# Patient Record
Sex: Female | Born: 1955
Health system: Southern US, Community
[De-identification: ages and names within clinical notes are randomized; demographics above are authoritative.]

## PROBLEM LIST (undated history)

## (undated) DIAGNOSIS — I77810 Thoracic aortic ectasia: Secondary | ICD-10-CM

## (undated) DIAGNOSIS — D509 Iron deficiency anemia, unspecified: Secondary | ICD-10-CM

## (undated) DIAGNOSIS — M1990 Unspecified osteoarthritis, unspecified site: Secondary | ICD-10-CM

## (undated) DIAGNOSIS — F419 Anxiety disorder, unspecified: Secondary | ICD-10-CM

## (undated) DIAGNOSIS — M4800 Spinal stenosis, site unspecified: Secondary | ICD-10-CM

## (undated) DIAGNOSIS — F32A Depression, unspecified: Secondary | ICD-10-CM

## (undated) DIAGNOSIS — Z9889 Other specified postprocedural states: Secondary | ICD-10-CM

## (undated) DIAGNOSIS — N3281 Overactive bladder: Secondary | ICD-10-CM

## (undated) DIAGNOSIS — H9192 Unspecified hearing loss, left ear: Secondary | ICD-10-CM

## (undated) DIAGNOSIS — K5790 Diverticulosis of intestine, part unspecified, without perforation or abscess without bleeding: Secondary | ICD-10-CM

## (undated) DIAGNOSIS — Z8669 Personal history of other diseases of the nervous system and sense organs: Secondary | ICD-10-CM

## (undated) DIAGNOSIS — F329 Major depressive disorder, single episode, unspecified: Secondary | ICD-10-CM

## (undated) DIAGNOSIS — I10 Essential (primary) hypertension: Secondary | ICD-10-CM

## (undated) DIAGNOSIS — I6529 Occlusion and stenosis of unspecified carotid artery: Secondary | ICD-10-CM

## (undated) DIAGNOSIS — E7800 Pure hypercholesterolemia, unspecified: Secondary | ICD-10-CM

## (undated) DIAGNOSIS — I7300 Raynaud's syndrome without gangrene: Secondary | ICD-10-CM

## (undated) DIAGNOSIS — G8321 Monoplegia of upper limb affecting right dominant side: Secondary | ICD-10-CM

## (undated) DIAGNOSIS — C50919 Malignant neoplasm of unspecified site of unspecified female breast: Secondary | ICD-10-CM

## (undated) DIAGNOSIS — K279 Peptic ulcer, site unspecified, unspecified as acute or chronic, without hemorrhage or perforation: Secondary | ICD-10-CM

## (undated) DIAGNOSIS — I359 Nonrheumatic aortic valve disorder, unspecified: Secondary | ICD-10-CM

## (undated) DIAGNOSIS — K219 Gastro-esophageal reflux disease without esophagitis: Secondary | ICD-10-CM

## (undated) HISTORY — PX: TUBAL LIGATION: SHX77

## (undated) HISTORY — DX: Other specified postprocedural states: Z98.890

## (undated) HISTORY — DX: Unspecified osteoarthritis, unspecified site: M19.90

## (undated) HISTORY — DX: Major depressive disorder, single episode, unspecified: F32.9

## (undated) HISTORY — DX: Iron deficiency anemia, unspecified: D50.9

## (undated) HISTORY — DX: Personal history of other diseases of the nervous system and sense organs: Z86.69

## (undated) HISTORY — DX: Diverticulosis of intestine, part unspecified, without perforation or abscess without bleeding: K57.90

## (undated) HISTORY — DX: Peptic ulcer, site unspecified, unspecified as acute or chronic, without hemorrhage or perforation: K27.9

## (undated) HISTORY — DX: Essential (primary) hypertension: I10

## (undated) HISTORY — DX: Occlusion and stenosis of unspecified carotid artery: I65.29

## (undated) HISTORY — DX: Gastro-esophageal reflux disease without esophagitis: K21.9

## (undated) HISTORY — DX: Pure hypercholesterolemia, unspecified: E78.00

## (undated) HISTORY — DX: Malignant neoplasm of unspecified site of unspecified female breast: C50.919

## (undated) HISTORY — DX: Thoracic aortic ectasia: I77.810

## (undated) HISTORY — DX: Raynaud's syndrome without gangrene: I73.00

## (undated) HISTORY — DX: Nonrheumatic aortic valve disorder, unspecified: I35.9

## (undated) HISTORY — PX: TONSILLECTOMY: SUR1361

## (undated) HISTORY — PX: BREAST SURGERY: SHX581

## (undated) HISTORY — DX: Spinal stenosis, site unspecified: M48.00

## (undated) HISTORY — DX: Depression, unspecified: F32.A

## (undated) HISTORY — DX: Anxiety disorder, unspecified: F41.9

## (undated) HISTORY — PX: APPENDECTOMY: SHX54

## (undated) HISTORY — PX: ESOPHAGOGASTRODUODENOSCOPY: SHX1529

## (undated) HISTORY — PX: TRIGGER FINGER RELEASE: SHX641

---

## 2009-12-18 HISTORY — PX: BREAST LUMPECTOMY WITH AXILLARY LYMPH NODE BIOPSY: SHX5593

## 2012-01-01 DIAGNOSIS — H524 Presbyopia: Secondary | ICD-10-CM | POA: Diagnosis not present

## 2012-01-01 DIAGNOSIS — H40019 Open angle with borderline findings, low risk, unspecified eye: Secondary | ICD-10-CM | POA: Diagnosis not present

## 2012-01-03 DIAGNOSIS — M545 Low back pain, unspecified: Secondary | ICD-10-CM | POA: Diagnosis not present

## 2012-01-08 DIAGNOSIS — Z853 Personal history of malignant neoplasm of breast: Secondary | ICD-10-CM | POA: Diagnosis not present

## 2012-01-11 DIAGNOSIS — Z853 Personal history of malignant neoplasm of breast: Secondary | ICD-10-CM | POA: Diagnosis not present

## 2012-01-22 DIAGNOSIS — C50919 Malignant neoplasm of unspecified site of unspecified female breast: Secondary | ICD-10-CM | POA: Diagnosis not present

## 2012-01-30 DIAGNOSIS — Z79899 Other long term (current) drug therapy: Secondary | ICD-10-CM | POA: Diagnosis not present

## 2012-01-30 DIAGNOSIS — M545 Low back pain: Secondary | ICD-10-CM | POA: Diagnosis not present

## 2012-01-30 DIAGNOSIS — I10 Essential (primary) hypertension: Secondary | ICD-10-CM | POA: Diagnosis not present

## 2012-01-30 DIAGNOSIS — G43009 Migraine without aura, not intractable, without status migrainosus: Secondary | ICD-10-CM | POA: Diagnosis not present

## 2012-01-30 DIAGNOSIS — E7800 Pure hypercholesterolemia, unspecified: Secondary | ICD-10-CM | POA: Diagnosis not present

## 2012-01-30 DIAGNOSIS — F341 Dysthymic disorder: Secondary | ICD-10-CM | POA: Diagnosis not present

## 2012-01-30 DIAGNOSIS — R634 Abnormal weight loss: Secondary | ICD-10-CM | POA: Diagnosis not present

## 2012-02-02 DIAGNOSIS — R634 Abnormal weight loss: Secondary | ICD-10-CM | POA: Diagnosis not present

## 2012-02-02 DIAGNOSIS — R918 Other nonspecific abnormal finding of lung field: Secondary | ICD-10-CM | POA: Diagnosis not present

## 2012-02-08 DIAGNOSIS — Z1212 Encounter for screening for malignant neoplasm of rectum: Secondary | ICD-10-CM | POA: Diagnosis not present

## 2012-02-27 DIAGNOSIS — G43009 Migraine without aura, not intractable, without status migrainosus: Secondary | ICD-10-CM | POA: Diagnosis not present

## 2012-02-27 DIAGNOSIS — R42 Dizziness and giddiness: Secondary | ICD-10-CM | POA: Diagnosis not present

## 2012-02-29 DIAGNOSIS — M545 Low back pain, unspecified: Secondary | ICD-10-CM | POA: Diagnosis not present

## 2012-02-29 DIAGNOSIS — M5137 Other intervertebral disc degeneration, lumbosacral region: Secondary | ICD-10-CM | POA: Diagnosis not present

## 2012-03-21 DIAGNOSIS — T148XXA Other injury of unspecified body region, initial encounter: Secondary | ICD-10-CM | POA: Diagnosis not present

## 2012-03-21 DIAGNOSIS — R221 Localized swelling, mass and lump, neck: Secondary | ICD-10-CM | POA: Diagnosis not present

## 2012-03-21 DIAGNOSIS — R42 Dizziness and giddiness: Secondary | ICD-10-CM | POA: Diagnosis not present

## 2012-03-21 DIAGNOSIS — R220 Localized swelling, mass and lump, head: Secondary | ICD-10-CM | POA: Diagnosis not present

## 2012-03-25 DIAGNOSIS — R220 Localized swelling, mass and lump, head: Secondary | ICD-10-CM | POA: Diagnosis not present

## 2012-03-25 DIAGNOSIS — M4120 Other idiopathic scoliosis, site unspecified: Secondary | ICD-10-CM | POA: Diagnosis not present

## 2012-03-25 DIAGNOSIS — R221 Localized swelling, mass and lump, neck: Secondary | ICD-10-CM | POA: Diagnosis not present

## 2012-04-09 DIAGNOSIS — H905 Unspecified sensorineural hearing loss: Secondary | ICD-10-CM | POA: Diagnosis not present

## 2012-04-12 DIAGNOSIS — H905 Unspecified sensorineural hearing loss: Secondary | ICD-10-CM | POA: Diagnosis not present

## 2012-04-12 DIAGNOSIS — R42 Dizziness and giddiness: Secondary | ICD-10-CM | POA: Diagnosis not present

## 2012-04-12 DIAGNOSIS — H903 Sensorineural hearing loss, bilateral: Secondary | ICD-10-CM | POA: Diagnosis not present

## 2012-04-19 DIAGNOSIS — H905 Unspecified sensorineural hearing loss: Secondary | ICD-10-CM | POA: Diagnosis not present

## 2012-04-25 DIAGNOSIS — H8110 Benign paroxysmal vertigo, unspecified ear: Secondary | ICD-10-CM | POA: Diagnosis not present

## 2012-04-29 DIAGNOSIS — R42 Dizziness and giddiness: Secondary | ICD-10-CM | POA: Diagnosis not present

## 2012-04-29 DIAGNOSIS — I10 Essential (primary) hypertension: Secondary | ICD-10-CM | POA: Diagnosis not present

## 2012-04-29 DIAGNOSIS — E7800 Pure hypercholesterolemia, unspecified: Secondary | ICD-10-CM | POA: Diagnosis not present

## 2012-04-29 DIAGNOSIS — F341 Dysthymic disorder: Secondary | ICD-10-CM | POA: Diagnosis not present

## 2012-04-30 DIAGNOSIS — R42 Dizziness and giddiness: Secondary | ICD-10-CM | POA: Diagnosis not present

## 2012-06-10 DIAGNOSIS — K219 Gastro-esophageal reflux disease without esophagitis: Secondary | ICD-10-CM | POA: Diagnosis not present

## 2012-06-10 DIAGNOSIS — K1370 Unspecified lesions of oral mucosa: Secondary | ICD-10-CM | POA: Diagnosis not present

## 2012-07-09 DIAGNOSIS — C50519 Malignant neoplasm of lower-outer quadrant of unspecified female breast: Secondary | ICD-10-CM | POA: Diagnosis not present

## 2012-08-30 DIAGNOSIS — M545 Low back pain, unspecified: Secondary | ICD-10-CM | POA: Diagnosis not present

## 2012-08-30 DIAGNOSIS — Z79899 Other long term (current) drug therapy: Secondary | ICD-10-CM | POA: Diagnosis not present

## 2012-08-30 DIAGNOSIS — G43009 Migraine without aura, not intractable, without status migrainosus: Secondary | ICD-10-CM | POA: Diagnosis not present

## 2012-08-30 DIAGNOSIS — I10 Essential (primary) hypertension: Secondary | ICD-10-CM | POA: Diagnosis not present

## 2012-08-30 DIAGNOSIS — E7800 Pure hypercholesterolemia, unspecified: Secondary | ICD-10-CM | POA: Diagnosis not present

## 2012-09-17 DIAGNOSIS — S46819A Strain of other muscles, fascia and tendons at shoulder and upper arm level, unspecified arm, initial encounter: Secondary | ICD-10-CM | POA: Diagnosis not present

## 2012-09-17 DIAGNOSIS — S43499A Other sprain of unspecified shoulder joint, initial encounter: Secondary | ICD-10-CM | POA: Diagnosis not present

## 2012-10-24 DIAGNOSIS — Z23 Encounter for immunization: Secondary | ICD-10-CM | POA: Diagnosis not present

## 2012-11-19 DIAGNOSIS — K219 Gastro-esophageal reflux disease without esophagitis: Secondary | ICD-10-CM | POA: Diagnosis not present

## 2012-11-19 DIAGNOSIS — R1013 Epigastric pain: Secondary | ICD-10-CM | POA: Diagnosis not present

## 2012-11-19 DIAGNOSIS — K5900 Constipation, unspecified: Secondary | ICD-10-CM | POA: Diagnosis not present

## 2012-11-19 DIAGNOSIS — R490 Dysphonia: Secondary | ICD-10-CM | POA: Diagnosis not present

## 2012-11-26 DIAGNOSIS — Z1212 Encounter for screening for malignant neoplasm of rectum: Secondary | ICD-10-CM | POA: Diagnosis not present

## 2012-12-18 HISTORY — PX: COLONOSCOPY: SHX174

## 2012-12-30 DIAGNOSIS — E7800 Pure hypercholesterolemia, unspecified: Secondary | ICD-10-CM | POA: Diagnosis not present

## 2012-12-30 DIAGNOSIS — D649 Anemia, unspecified: Secondary | ICD-10-CM | POA: Diagnosis not present

## 2012-12-30 DIAGNOSIS — I7300 Raynaud's syndrome without gangrene: Secondary | ICD-10-CM | POA: Diagnosis not present

## 2012-12-30 DIAGNOSIS — I10 Essential (primary) hypertension: Secondary | ICD-10-CM | POA: Diagnosis not present

## 2012-12-30 DIAGNOSIS — K219 Gastro-esophageal reflux disease without esophagitis: Secondary | ICD-10-CM | POA: Diagnosis not present

## 2013-01-06 DIAGNOSIS — R634 Abnormal weight loss: Secondary | ICD-10-CM | POA: Diagnosis not present

## 2013-01-06 DIAGNOSIS — D500 Iron deficiency anemia secondary to blood loss (chronic): Secondary | ICD-10-CM | POA: Diagnosis not present

## 2013-01-06 DIAGNOSIS — R195 Other fecal abnormalities: Secondary | ICD-10-CM | POA: Diagnosis not present

## 2013-01-06 DIAGNOSIS — R1013 Epigastric pain: Secondary | ICD-10-CM | POA: Diagnosis not present

## 2013-01-08 DIAGNOSIS — C50919 Malignant neoplasm of unspecified site of unspecified female breast: Secondary | ICD-10-CM | POA: Diagnosis not present

## 2013-01-14 DIAGNOSIS — K921 Melena: Secondary | ICD-10-CM | POA: Diagnosis not present

## 2013-01-14 DIAGNOSIS — R928 Other abnormal and inconclusive findings on diagnostic imaging of breast: Secondary | ICD-10-CM | POA: Diagnosis not present

## 2013-01-23 DIAGNOSIS — K263 Acute duodenal ulcer without hemorrhage or perforation: Secondary | ICD-10-CM | POA: Diagnosis not present

## 2013-01-23 DIAGNOSIS — D649 Anemia, unspecified: Secondary | ICD-10-CM | POA: Diagnosis not present

## 2013-01-23 DIAGNOSIS — K259 Gastric ulcer, unspecified as acute or chronic, without hemorrhage or perforation: Secondary | ICD-10-CM | POA: Diagnosis not present

## 2013-01-23 DIAGNOSIS — F17200 Nicotine dependence, unspecified, uncomplicated: Secondary | ICD-10-CM | POA: Diagnosis not present

## 2013-01-23 DIAGNOSIS — K921 Melena: Secondary | ICD-10-CM | POA: Diagnosis not present

## 2013-01-23 DIAGNOSIS — K5730 Diverticulosis of large intestine without perforation or abscess without bleeding: Secondary | ICD-10-CM | POA: Diagnosis not present

## 2013-01-27 DIAGNOSIS — I10 Essential (primary) hypertension: Secondary | ICD-10-CM | POA: Diagnosis not present

## 2013-01-27 DIAGNOSIS — C50919 Malignant neoplasm of unspecified site of unspecified female breast: Secondary | ICD-10-CM | POA: Diagnosis not present

## 2013-01-27 DIAGNOSIS — I359 Nonrheumatic aortic valve disorder, unspecified: Secondary | ICD-10-CM | POA: Diagnosis not present

## 2013-01-27 DIAGNOSIS — Z0389 Encounter for observation for other suspected diseases and conditions ruled out: Secondary | ICD-10-CM | POA: Diagnosis not present

## 2013-01-27 DIAGNOSIS — I369 Nonrheumatic tricuspid valve disorder, unspecified: Secondary | ICD-10-CM | POA: Diagnosis not present

## 2013-01-27 DIAGNOSIS — Z79899 Other long term (current) drug therapy: Secondary | ICD-10-CM | POA: Diagnosis not present

## 2013-02-18 DIAGNOSIS — R928 Other abnormal and inconclusive findings on diagnostic imaging of breast: Secondary | ICD-10-CM | POA: Diagnosis not present

## 2013-02-21 DIAGNOSIS — Z79899 Other long term (current) drug therapy: Secondary | ICD-10-CM | POA: Diagnosis not present

## 2013-02-21 DIAGNOSIS — C50919 Malignant neoplasm of unspecified site of unspecified female breast: Secondary | ICD-10-CM | POA: Diagnosis not present

## 2013-02-21 DIAGNOSIS — I10 Essential (primary) hypertension: Secondary | ICD-10-CM | POA: Diagnosis not present

## 2013-02-26 DIAGNOSIS — R928 Other abnormal and inconclusive findings on diagnostic imaging of breast: Secondary | ICD-10-CM | POA: Diagnosis not present

## 2013-02-26 DIAGNOSIS — R920 Mammographic microcalcification found on diagnostic imaging of breast: Secondary | ICD-10-CM | POA: Diagnosis not present

## 2013-02-26 DIAGNOSIS — Z79899 Other long term (current) drug therapy: Secondary | ICD-10-CM | POA: Diagnosis not present

## 2013-02-26 DIAGNOSIS — N6039 Fibrosclerosis of unspecified breast: Secondary | ICD-10-CM | POA: Diagnosis not present

## 2013-02-26 DIAGNOSIS — N63 Unspecified lump in unspecified breast: Secondary | ICD-10-CM | POA: Diagnosis not present

## 2013-02-26 DIAGNOSIS — I10 Essential (primary) hypertension: Secondary | ICD-10-CM | POA: Diagnosis not present

## 2013-02-26 DIAGNOSIS — C50919 Malignant neoplasm of unspecified site of unspecified female breast: Secondary | ICD-10-CM | POA: Diagnosis not present

## 2013-02-26 DIAGNOSIS — F17200 Nicotine dependence, unspecified, uncomplicated: Secondary | ICD-10-CM | POA: Diagnosis not present

## 2013-02-26 DIAGNOSIS — J449 Chronic obstructive pulmonary disease, unspecified: Secondary | ICD-10-CM | POA: Diagnosis not present

## 2013-03-06 DIAGNOSIS — M654 Radial styloid tenosynovitis [de Quervain]: Secondary | ICD-10-CM | POA: Diagnosis not present

## 2013-03-18 DIAGNOSIS — M25529 Pain in unspecified elbow: Secondary | ICD-10-CM | POA: Diagnosis not present

## 2013-03-18 DIAGNOSIS — M654 Radial styloid tenosynovitis [de Quervain]: Secondary | ICD-10-CM | POA: Diagnosis not present

## 2013-03-31 DIAGNOSIS — M5137 Other intervertebral disc degeneration, lumbosacral region: Secondary | ICD-10-CM | POA: Diagnosis not present

## 2013-03-31 DIAGNOSIS — M545 Low back pain, unspecified: Secondary | ICD-10-CM | POA: Diagnosis not present

## 2013-03-31 DIAGNOSIS — M48061 Spinal stenosis, lumbar region without neurogenic claudication: Secondary | ICD-10-CM | POA: Diagnosis not present

## 2013-04-29 DIAGNOSIS — Z79899 Other long term (current) drug therapy: Secondary | ICD-10-CM | POA: Diagnosis not present

## 2013-04-29 DIAGNOSIS — D649 Anemia, unspecified: Secondary | ICD-10-CM | POA: Diagnosis not present

## 2013-04-29 DIAGNOSIS — I10 Essential (primary) hypertension: Secondary | ICD-10-CM | POA: Diagnosis not present

## 2013-04-29 DIAGNOSIS — G43009 Migraine without aura, not intractable, without status migrainosus: Secondary | ICD-10-CM | POA: Diagnosis not present

## 2013-05-01 DIAGNOSIS — M5137 Other intervertebral disc degeneration, lumbosacral region: Secondary | ICD-10-CM | POA: Diagnosis not present

## 2013-05-01 DIAGNOSIS — Z006 Encounter for examination for normal comparison and control in clinical research program: Secondary | ICD-10-CM | POA: Diagnosis not present

## 2013-05-01 DIAGNOSIS — M48061 Spinal stenosis, lumbar region without neurogenic claudication: Secondary | ICD-10-CM | POA: Diagnosis not present

## 2013-05-01 DIAGNOSIS — M545 Low back pain, unspecified: Secondary | ICD-10-CM | POA: Diagnosis not present

## 2013-05-12 DIAGNOSIS — N951 Menopausal and female climacteric states: Secondary | ICD-10-CM | POA: Diagnosis not present

## 2013-05-15 DIAGNOSIS — M5137 Other intervertebral disc degeneration, lumbosacral region: Secondary | ICD-10-CM | POA: Diagnosis not present

## 2013-05-15 DIAGNOSIS — M48061 Spinal stenosis, lumbar region without neurogenic claudication: Secondary | ICD-10-CM | POA: Diagnosis not present

## 2013-06-12 DIAGNOSIS — M545 Low back pain, unspecified: Secondary | ICD-10-CM | POA: Diagnosis not present

## 2013-06-12 DIAGNOSIS — M5137 Other intervertebral disc degeneration, lumbosacral region: Secondary | ICD-10-CM | POA: Diagnosis not present

## 2013-06-12 DIAGNOSIS — M48061 Spinal stenosis, lumbar region without neurogenic claudication: Secondary | ICD-10-CM | POA: Diagnosis not present

## 2013-06-19 DIAGNOSIS — M545 Low back pain: Secondary | ICD-10-CM | POA: Diagnosis not present

## 2013-06-30 DIAGNOSIS — M48061 Spinal stenosis, lumbar region without neurogenic claudication: Secondary | ICD-10-CM | POA: Diagnosis not present

## 2013-07-02 DIAGNOSIS — M545 Low back pain, unspecified: Secondary | ICD-10-CM | POA: Diagnosis not present

## 2013-07-02 DIAGNOSIS — IMO0002 Reserved for concepts with insufficient information to code with codable children: Secondary | ICD-10-CM | POA: Diagnosis not present

## 2013-07-02 DIAGNOSIS — M48061 Spinal stenosis, lumbar region without neurogenic claudication: Secondary | ICD-10-CM | POA: Diagnosis not present

## 2013-07-09 ENCOUNTER — Other Ambulatory Visit: Payer: Self-pay | Admitting: Anesthesiology

## 2013-07-09 DIAGNOSIS — IMO0002 Reserved for concepts with insufficient information to code with codable children: Secondary | ICD-10-CM

## 2013-07-16 ENCOUNTER — Ambulatory Visit
Admission: RE | Admit: 2013-07-16 | Discharge: 2013-07-16 | Disposition: A | Discharge status: Home / Self Care | Payer: Medicare Other | Source: Ambulatory Visit | Attending: Anesthesiology | Admitting: Anesthesiology

## 2013-07-16 DIAGNOSIS — M48061 Spinal stenosis, lumbar region without neurogenic claudication: Secondary | ICD-10-CM | POA: Diagnosis not present

## 2013-07-16 DIAGNOSIS — M47817 Spondylosis without myelopathy or radiculopathy, lumbosacral region: Secondary | ICD-10-CM | POA: Diagnosis not present

## 2013-07-16 DIAGNOSIS — IMO0002 Reserved for concepts with insufficient information to code with codable children: Secondary | ICD-10-CM

## 2013-08-11 DIAGNOSIS — M545 Low back pain, unspecified: Secondary | ICD-10-CM | POA: Diagnosis not present

## 2013-08-11 DIAGNOSIS — IMO0002 Reserved for concepts with insufficient information to code with codable children: Secondary | ICD-10-CM | POA: Diagnosis not present

## 2013-08-11 DIAGNOSIS — M5137 Other intervertebral disc degeneration, lumbosacral region: Secondary | ICD-10-CM | POA: Diagnosis not present

## 2013-08-11 DIAGNOSIS — M48061 Spinal stenosis, lumbar region without neurogenic claudication: Secondary | ICD-10-CM | POA: Diagnosis not present

## 2013-08-12 DIAGNOSIS — M545 Low back pain, unspecified: Secondary | ICD-10-CM | POA: Diagnosis not present

## 2013-08-12 DIAGNOSIS — M51379 Other intervertebral disc degeneration, lumbosacral region without mention of lumbar back pain or lower extremity pain: Secondary | ICD-10-CM | POA: Diagnosis not present

## 2013-08-12 DIAGNOSIS — M5137 Other intervertebral disc degeneration, lumbosacral region: Secondary | ICD-10-CM | POA: Diagnosis not present

## 2013-08-28 DIAGNOSIS — M545 Low back pain: Secondary | ICD-10-CM | POA: Diagnosis not present

## 2013-09-01 DIAGNOSIS — M545 Low back pain: Secondary | ICD-10-CM | POA: Diagnosis not present

## 2013-09-02 DIAGNOSIS — Z79899 Other long term (current) drug therapy: Secondary | ICD-10-CM | POA: Diagnosis not present

## 2013-09-02 DIAGNOSIS — G43009 Migraine without aura, not intractable, without status migrainosus: Secondary | ICD-10-CM | POA: Diagnosis not present

## 2013-09-02 DIAGNOSIS — E7800 Pure hypercholesterolemia, unspecified: Secondary | ICD-10-CM | POA: Diagnosis not present

## 2013-09-02 DIAGNOSIS — I10 Essential (primary) hypertension: Secondary | ICD-10-CM | POA: Diagnosis not present

## 2013-09-02 DIAGNOSIS — F341 Dysthymic disorder: Secondary | ICD-10-CM | POA: Diagnosis not present

## 2013-09-02 DIAGNOSIS — M545 Low back pain: Secondary | ICD-10-CM | POA: Diagnosis not present

## 2013-09-05 DIAGNOSIS — M545 Low back pain: Secondary | ICD-10-CM | POA: Diagnosis not present

## 2013-10-06 DIAGNOSIS — M545 Low back pain: Secondary | ICD-10-CM | POA: Diagnosis not present

## 2013-10-06 DIAGNOSIS — D509 Iron deficiency anemia, unspecified: Secondary | ICD-10-CM | POA: Diagnosis not present

## 2013-10-12 DIAGNOSIS — Z23 Encounter for immunization: Secondary | ICD-10-CM | POA: Diagnosis not present

## 2013-10-27 DIAGNOSIS — M545 Low back pain: Secondary | ICD-10-CM | POA: Diagnosis not present

## 2013-11-03 DIAGNOSIS — M545 Low back pain: Secondary | ICD-10-CM | POA: Diagnosis not present

## 2013-11-04 DIAGNOSIS — M545 Low back pain, unspecified: Secondary | ICD-10-CM | POA: Diagnosis not present

## 2013-11-04 DIAGNOSIS — M48061 Spinal stenosis, lumbar region without neurogenic claudication: Secondary | ICD-10-CM | POA: Diagnosis not present

## 2013-11-04 DIAGNOSIS — M5137 Other intervertebral disc degeneration, lumbosacral region: Secondary | ICD-10-CM | POA: Diagnosis not present

## 2013-11-04 DIAGNOSIS — M62838 Other muscle spasm: Secondary | ICD-10-CM | POA: Diagnosis not present

## 2013-11-10 DIAGNOSIS — M545 Low back pain: Secondary | ICD-10-CM | POA: Diagnosis not present

## 2013-11-17 DIAGNOSIS — M545 Low back pain: Secondary | ICD-10-CM | POA: Diagnosis not present

## 2014-01-02 DIAGNOSIS — F17200 Nicotine dependence, unspecified, uncomplicated: Secondary | ICD-10-CM | POA: Diagnosis not present

## 2014-01-02 DIAGNOSIS — I10 Essential (primary) hypertension: Secondary | ICD-10-CM | POA: Diagnosis not present

## 2014-01-02 DIAGNOSIS — M545 Low back pain, unspecified: Secondary | ICD-10-CM | POA: Diagnosis not present

## 2014-01-02 DIAGNOSIS — E7800 Pure hypercholesterolemia, unspecified: Secondary | ICD-10-CM | POA: Diagnosis not present

## 2014-01-02 DIAGNOSIS — Z79899 Other long term (current) drug therapy: Secondary | ICD-10-CM | POA: Diagnosis not present

## 2014-01-02 DIAGNOSIS — K219 Gastro-esophageal reflux disease without esophagitis: Secondary | ICD-10-CM | POA: Diagnosis not present

## 2014-01-02 DIAGNOSIS — F341 Dysthymic disorder: Secondary | ICD-10-CM | POA: Diagnosis not present

## 2014-01-02 DIAGNOSIS — G43009 Migraine without aura, not intractable, without status migrainosus: Secondary | ICD-10-CM | POA: Diagnosis not present

## 2014-01-14 DIAGNOSIS — C50919 Malignant neoplasm of unspecified site of unspecified female breast: Secondary | ICD-10-CM | POA: Diagnosis not present

## 2014-01-14 DIAGNOSIS — R922 Inconclusive mammogram: Secondary | ICD-10-CM | POA: Diagnosis not present

## 2014-01-27 DIAGNOSIS — C779 Secondary and unspecified malignant neoplasm of lymph node, unspecified: Secondary | ICD-10-CM | POA: Diagnosis not present

## 2014-01-27 DIAGNOSIS — C50119 Malignant neoplasm of central portion of unspecified female breast: Secondary | ICD-10-CM | POA: Diagnosis not present

## 2014-01-29 DIAGNOSIS — M545 Low back pain, unspecified: Secondary | ICD-10-CM | POA: Diagnosis not present

## 2014-01-29 DIAGNOSIS — M48061 Spinal stenosis, lumbar region without neurogenic claudication: Secondary | ICD-10-CM | POA: Diagnosis not present

## 2014-01-29 DIAGNOSIS — M4120 Other idiopathic scoliosis, site unspecified: Secondary | ICD-10-CM | POA: Insufficient documentation

## 2014-01-29 DIAGNOSIS — M62838 Other muscle spasm: Secondary | ICD-10-CM | POA: Diagnosis not present

## 2014-01-29 DIAGNOSIS — IMO0002 Reserved for concepts with insufficient information to code with codable children: Secondary | ICD-10-CM | POA: Diagnosis not present

## 2014-04-23 DIAGNOSIS — M5137 Other intervertebral disc degeneration, lumbosacral region: Secondary | ICD-10-CM | POA: Diagnosis not present

## 2014-04-23 DIAGNOSIS — IMO0002 Reserved for concepts with insufficient information to code with codable children: Secondary | ICD-10-CM | POA: Diagnosis not present

## 2014-04-23 DIAGNOSIS — M62838 Other muscle spasm: Secondary | ICD-10-CM | POA: Diagnosis not present

## 2014-04-23 DIAGNOSIS — M5417 Radiculopathy, lumbosacral region: Secondary | ICD-10-CM | POA: Insufficient documentation

## 2014-04-23 DIAGNOSIS — M545 Low back pain, unspecified: Secondary | ICD-10-CM | POA: Diagnosis not present

## 2014-05-27 DIAGNOSIS — L821 Other seborrheic keratosis: Secondary | ICD-10-CM | POA: Diagnosis not present

## 2014-05-27 DIAGNOSIS — C44319 Basal cell carcinoma of skin of other parts of face: Secondary | ICD-10-CM | POA: Diagnosis not present

## 2014-07-02 DIAGNOSIS — R5383 Other fatigue: Secondary | ICD-10-CM | POA: Diagnosis not present

## 2014-07-02 DIAGNOSIS — G43009 Migraine without aura, not intractable, without status migrainosus: Secondary | ICD-10-CM | POA: Diagnosis not present

## 2014-07-02 DIAGNOSIS — I359 Nonrheumatic aortic valve disorder, unspecified: Secondary | ICD-10-CM | POA: Diagnosis not present

## 2014-07-02 DIAGNOSIS — K219 Gastro-esophageal reflux disease without esophagitis: Secondary | ICD-10-CM | POA: Diagnosis not present

## 2014-07-02 DIAGNOSIS — E7800 Pure hypercholesterolemia, unspecified: Secondary | ICD-10-CM | POA: Diagnosis not present

## 2014-07-02 DIAGNOSIS — F341 Dysthymic disorder: Secondary | ICD-10-CM | POA: Diagnosis not present

## 2014-07-02 DIAGNOSIS — N318 Other neuromuscular dysfunction of bladder: Secondary | ICD-10-CM | POA: Diagnosis not present

## 2014-07-02 DIAGNOSIS — R5381 Other malaise: Secondary | ICD-10-CM | POA: Diagnosis not present

## 2014-07-16 DIAGNOSIS — M545 Low back pain, unspecified: Secondary | ICD-10-CM | POA: Diagnosis not present

## 2014-07-16 DIAGNOSIS — M62838 Other muscle spasm: Secondary | ICD-10-CM | POA: Diagnosis not present

## 2014-07-16 DIAGNOSIS — M4120 Other idiopathic scoliosis, site unspecified: Secondary | ICD-10-CM | POA: Diagnosis not present

## 2014-07-16 DIAGNOSIS — IMO0002 Reserved for concepts with insufficient information to code with codable children: Secondary | ICD-10-CM | POA: Diagnosis not present

## 2014-09-09 DIAGNOSIS — H40019 Open angle with borderline findings, low risk, unspecified eye: Secondary | ICD-10-CM | POA: Diagnosis not present

## 2014-09-16 DIAGNOSIS — M545 Low back pain, unspecified: Secondary | ICD-10-CM | POA: Diagnosis not present

## 2014-09-16 DIAGNOSIS — M5137 Other intervertebral disc degeneration, lumbosacral region: Secondary | ICD-10-CM | POA: Diagnosis not present

## 2014-09-16 DIAGNOSIS — IMO0002 Reserved for concepts with insufficient information to code with codable children: Secondary | ICD-10-CM | POA: Diagnosis not present

## 2014-09-16 DIAGNOSIS — M62838 Other muscle spasm: Secondary | ICD-10-CM | POA: Diagnosis not present

## 2014-10-14 DIAGNOSIS — H40013 Open angle with borderline findings, low risk, bilateral: Secondary | ICD-10-CM | POA: Diagnosis not present

## 2014-10-21 DIAGNOSIS — Z23 Encounter for immunization: Secondary | ICD-10-CM | POA: Diagnosis not present

## 2014-12-07 DIAGNOSIS — M62838 Other muscle spasm: Secondary | ICD-10-CM | POA: Diagnosis not present

## 2014-12-07 DIAGNOSIS — M4806 Spinal stenosis, lumbar region: Secondary | ICD-10-CM | POA: Diagnosis not present

## 2014-12-07 DIAGNOSIS — M5136 Other intervertebral disc degeneration, lumbar region: Secondary | ICD-10-CM | POA: Diagnosis not present

## 2014-12-07 DIAGNOSIS — M5416 Radiculopathy, lumbar region: Secondary | ICD-10-CM | POA: Diagnosis not present

## 2014-12-07 DIAGNOSIS — M545 Low back pain: Secondary | ICD-10-CM | POA: Diagnosis not present

## 2014-12-07 DIAGNOSIS — M4120 Other idiopathic scoliosis, site unspecified: Secondary | ICD-10-CM | POA: Diagnosis not present

## 2015-01-12 DIAGNOSIS — F418 Other specified anxiety disorders: Secondary | ICD-10-CM | POA: Diagnosis not present

## 2015-01-12 DIAGNOSIS — Z79899 Other long term (current) drug therapy: Secondary | ICD-10-CM | POA: Diagnosis not present

## 2015-01-12 DIAGNOSIS — K219 Gastro-esophageal reflux disease without esophagitis: Secondary | ICD-10-CM | POA: Diagnosis not present

## 2015-01-12 DIAGNOSIS — I359 Nonrheumatic aortic valve disorder, unspecified: Secondary | ICD-10-CM | POA: Diagnosis not present

## 2015-01-12 DIAGNOSIS — Z1389 Encounter for screening for other disorder: Secondary | ICD-10-CM | POA: Diagnosis not present

## 2015-01-12 DIAGNOSIS — G43009 Migraine without aura, not intractable, without status migrainosus: Secondary | ICD-10-CM | POA: Diagnosis not present

## 2015-01-12 DIAGNOSIS — N3281 Overactive bladder: Secondary | ICD-10-CM | POA: Diagnosis not present

## 2015-01-12 DIAGNOSIS — Z124 Encounter for screening for malignant neoplasm of cervix: Secondary | ICD-10-CM | POA: Diagnosis not present

## 2015-01-12 DIAGNOSIS — E780 Pure hypercholesterolemia: Secondary | ICD-10-CM | POA: Diagnosis not present

## 2015-01-12 DIAGNOSIS — M545 Low back pain: Secondary | ICD-10-CM | POA: Diagnosis not present

## 2015-01-18 DIAGNOSIS — Z9889 Other specified postprocedural states: Secondary | ICD-10-CM | POA: Diagnosis not present

## 2015-01-18 DIAGNOSIS — C50919 Malignant neoplasm of unspecified site of unspecified female breast: Secondary | ICD-10-CM | POA: Diagnosis not present

## 2015-01-18 DIAGNOSIS — Z853 Personal history of malignant neoplasm of breast: Secondary | ICD-10-CM | POA: Diagnosis not present

## 2015-02-09 DIAGNOSIS — C50111 Malignant neoplasm of central portion of right female breast: Secondary | ICD-10-CM | POA: Diagnosis not present

## 2015-02-09 DIAGNOSIS — C773 Secondary and unspecified malignant neoplasm of axilla and upper limb lymph nodes: Secondary | ICD-10-CM | POA: Diagnosis not present

## 2015-02-09 DIAGNOSIS — Z170 Estrogen receptor positive status [ER+]: Secondary | ICD-10-CM | POA: Diagnosis not present

## 2015-03-02 DIAGNOSIS — M62838 Other muscle spasm: Secondary | ICD-10-CM | POA: Diagnosis not present

## 2015-03-02 DIAGNOSIS — M4120 Other idiopathic scoliosis, site unspecified: Secondary | ICD-10-CM | POA: Diagnosis not present

## 2015-03-02 DIAGNOSIS — M4806 Spinal stenosis, lumbar region: Secondary | ICD-10-CM | POA: Diagnosis not present

## 2015-03-02 DIAGNOSIS — M5136 Other intervertebral disc degeneration, lumbar region: Secondary | ICD-10-CM | POA: Diagnosis not present

## 2015-03-02 DIAGNOSIS — M5416 Radiculopathy, lumbar region: Secondary | ICD-10-CM | POA: Diagnosis not present

## 2015-03-02 DIAGNOSIS — M545 Low back pain: Secondary | ICD-10-CM | POA: Diagnosis not present

## 2015-03-16 DIAGNOSIS — Z01419 Encounter for gynecological examination (general) (routine) without abnormal findings: Secondary | ICD-10-CM | POA: Diagnosis not present

## 2015-03-17 DIAGNOSIS — Z01419 Encounter for gynecological examination (general) (routine) without abnormal findings: Secondary | ICD-10-CM | POA: Diagnosis not present

## 2015-05-20 DIAGNOSIS — M4120 Other idiopathic scoliosis, site unspecified: Secondary | ICD-10-CM | POA: Diagnosis not present

## 2015-05-20 DIAGNOSIS — M5136 Other intervertebral disc degeneration, lumbar region: Secondary | ICD-10-CM | POA: Diagnosis not present

## 2015-05-20 DIAGNOSIS — M4806 Spinal stenosis, lumbar region: Secondary | ICD-10-CM | POA: Diagnosis not present

## 2015-05-20 DIAGNOSIS — M62838 Other muscle spasm: Secondary | ICD-10-CM | POA: Diagnosis not present

## 2015-05-20 DIAGNOSIS — M5416 Radiculopathy, lumbar region: Secondary | ICD-10-CM | POA: Diagnosis not present

## 2015-05-20 DIAGNOSIS — M545 Low back pain: Secondary | ICD-10-CM | POA: Diagnosis not present

## 2015-07-20 DIAGNOSIS — I359 Nonrheumatic aortic valve disorder, unspecified: Secondary | ICD-10-CM | POA: Diagnosis not present

## 2015-07-20 DIAGNOSIS — R609 Edema, unspecified: Secondary | ICD-10-CM | POA: Diagnosis not present

## 2015-07-20 DIAGNOSIS — I10 Essential (primary) hypertension: Secondary | ICD-10-CM | POA: Diagnosis not present

## 2015-07-20 DIAGNOSIS — Z681 Body mass index (BMI) 19 or less, adult: Secondary | ICD-10-CM | POA: Diagnosis not present

## 2015-07-20 DIAGNOSIS — M545 Low back pain: Secondary | ICD-10-CM | POA: Diagnosis not present

## 2015-07-20 DIAGNOSIS — F418 Other specified anxiety disorders: Secondary | ICD-10-CM | POA: Diagnosis not present

## 2015-07-20 DIAGNOSIS — K219 Gastro-esophageal reflux disease without esophagitis: Secondary | ICD-10-CM | POA: Diagnosis not present

## 2015-07-20 DIAGNOSIS — E780 Pure hypercholesterolemia: Secondary | ICD-10-CM | POA: Diagnosis not present

## 2015-07-28 DIAGNOSIS — I517 Cardiomegaly: Secondary | ICD-10-CM | POA: Diagnosis not present

## 2015-07-28 DIAGNOSIS — I351 Nonrheumatic aortic (valve) insufficiency: Secondary | ICD-10-CM | POA: Diagnosis not present

## 2015-07-28 DIAGNOSIS — I359 Nonrheumatic aortic valve disorder, unspecified: Secondary | ICD-10-CM | POA: Diagnosis not present

## 2015-08-10 ENCOUNTER — Institutional Professional Consult (permissible substitution) (INDEPENDENT_AMBULATORY_CARE_PROVIDER_SITE_OTHER): Payer: Medicare Other | Admitting: Cardiothoracic Surgery

## 2015-08-10 ENCOUNTER — Encounter: Payer: Medicare Other | Admitting: Cardiothoracic Surgery

## 2015-08-10 VITALS — BP 120/73 | HR 72 | Resp 20 | Ht 61.5 in | Wt 98.0 lb

## 2015-08-10 DIAGNOSIS — Z952 Presence of prosthetic heart valve: Secondary | ICD-10-CM | POA: Insufficient documentation

## 2015-08-10 DIAGNOSIS — I350 Nonrheumatic aortic (valve) stenosis: Secondary | ICD-10-CM | POA: Diagnosis not present

## 2015-08-10 NOTE — Progress Notes (Signed)
PCP is Fae Pippin Referring Provider is Dorathy Kinsman, MD  Chief Complaint  Patient presents with  . Aortic Stenosis    Surgical eval, ECHO 07/28/15 @ [facility]  severe aortic stenosis on recent 2-D echocardiogram with a peak transvalvular gradient 100 mm mercury, mean transvalvular gradient 55 mmHg. Patient examined and the 2-D echocardiogram personally reviewed  HPI: 59 year old Caucasian female smoker with history of cardiac murmur since age 5. Subsequent echocardiogram demonstrated that she had a bicuspid aortic valve. She had 2 uncomplicated pregnancies. She is been followed carefully by her primary care physician with serial echocardiograms. In 2014 8 transthoracic echocardiogram demonstrated moderate aortic stenosis with a peak gradient of 50 mm mercury. She was asymptomatic. Over the past 3-4 years the patient has had progressive weight loss and has had a duodenal ulcer diagnosed and treated since 2014. The patient remains fairly active in her [place] but is unable to regain weight. A repeat echocardiogram performed earlier this month demonstrates severe aortic stenosis with the gradients as noted above. The images show a heavily calcified valve very thickened. The LV outflow tract is approximately 18-20 mm. There is no significant mitral regurgitation or tricuspid regurgitation. LV systolic function is normal. There is mild LVH. The aortic root does not appear to be dilated.the patient is in a sinus rhythm. She specifically denies symptoms of chest pain or shortness of breath. She does have some palpitations and some symptoms of presyncope.She has noted significant pedal edema over the past 2 months.  The patient denies any active dental problems. She has partial plates. She had her teeth cleaned within the past year.  The patient is a heavy smoker. She still smoking just over one pack per day. She denies history current bronchitis or pneumonia. PFTs are pending.  Past Medical History   Diagnosis Date  . Hypertension   . Aortic valve disease   . Migraine   . Hypercholesterolemia   . Anxiety and depression   . Low back pain   . Iron deficiency anemia   . Osteoarthritis     hands  . Breast cancer     Dr Bobby Rumpf Dr Rogelia Boga, right side 2011  . Raynaud's syndrome   . GERD (gastroesophageal reflux disease)     Past Surgical History  Procedure Laterality Date  . Breast surgery      right side  . Cesarean section    . Tubal ligation      Family History  Problem Relation Age of Onset  . COPD Father   . Heart disease Father   . Hyperlipidemia Father   . Hypertension Father   . Hypertension Mother   . Heart disease Sister   . Hyperlipidemia Sister   . Hypertension Sister   . COPD Brother   . Heart disease Brother   . Hyperlipidemia Brother   . Hypertension Brother     Social History Social History  Substance Use Topics  . Smoking status: Current Every Day Smoker -- 1.00 packs/day    Types: Cigarettes  . Smokeless tobacco: None  . Alcohol Use: No    Current Outpatient Prescriptions  Medication Sig Dispense Refill  . ALPRAZolam (XANAX) 1 MG tablet Take 1 mg by mouth at bedtime as needed for anxiety.    . citalopram (CELEXA) 20 MG tablet Take 20 mg by mouth daily.    Marland Kitchen esomeprazole (NEXIUM) 40 MG capsule Take 40 mg by mouth daily at 12 noon.    . ferrous sulfate 325 (65 FE) MG tablet Take  325 mg by mouth daily with breakfast.    . hydrOXYzine (ATARAX/VISTARIL) 25 MG tablet Take 25 mg by mouth 2 (two) times daily.    . magnesium hydroxide (MILK OF MAGNESIA) 400 MG/5ML suspension Take by mouth daily as needed for mild constipation.    . OxyCODONE (OXYCONTIN) 20 mg T12A 12 hr tablet Take 20 mg by mouth every 12 (twelve) hours.    Marland Kitchen oxyCODONE-acetaminophen (PERCOCET) 7.5-325 MG per tablet Take 1 tablet by mouth every 8 (eight) hours as needed for severe pain.    . promethazine (PHENERGAN) 25 MG tablet Take 25 mg by mouth every 6 (six) hours as needed for  nausea or vomiting.    . ranitidine (ZANTAC) 150 MG tablet Take 150 mg by mouth 2 (two) times daily.    . simvastatin (ZOCOR) 40 MG tablet Take 40 mg by mouth daily.    . solifenacin (VESICARE) 5 MG tablet Take 5 mg by mouth daily.    . SUMAtriptan (IMITREX) 100 MG tablet Take 100 mg by mouth every 2 (two) hours as needed for migraine. May repeat in 2 hours if headache persists or recurs.    . cyclobenzaprine (FLEXERIL) 10 MG tablet Take 10 mg by mouth 3 (three) times daily as needed for muscle spasms.     No current facility-administered medications for this visit.    No Known Allergies  Review of Systems         Review of Systems :  [ y ] = yes, [  ] = no        General :  Weight gain [   ]    Weight loss  [place].Blacker   ]  Fatigue [ yes ]  Fever [  no]  Chills  [no  ]                                Weakness  [ no ]           Cardiac :  Chest pain/ pressure [ no ]  Resting SOB [no  ] exertional SOB [  no]                        Orthopnea [no  ]  Pedal edema  [ yes ]  Palpitations [place].Blacker  ] Syncope/presyncope [place].Blacker ]                        Paroxysmal nocturnal dyspnea [ no ]        Pulmonary : cough [  ]  wheezing [  ]  Hemoptysis [  ] Sputum [  ] Snoring [  ]                              Pneumothorax [  ]  Sleep apnea [  ]       GI : Vomiting [  ]  Dysphagia [  ]  Melena  [  ]  Abdominal pain [  ] BRBPR [  ]positive history of duodenal ulcer 2014, currently with blood loss anemia hemoglobin 9.2              Heart burn [  ]  Constipation [  ] Diarrhea  [  ] Colonoscopy [  ]       GU : Hematuria [  ]  Dysuria [  ]  Nocturia [  ] UTI's [  ]       Vascular : Claudication [  ]  Rest pain [  ]  DVT [  ] Vein stripping [  ] leg ulcers [  ]                          TIA [  ] Stroke [  ]  Varicose veins [  ]       NEURO :  Headaches  [  ] Seizures [  ] Vision changes [  ] Paresthesias [  ]       Musculoskeletal :  Arthritis [  ] Gout  [  ]  Back pain [place].Blacker ]  Joint pain [  ]patient has scoliosis with  chronic back pain. 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 No chemotherapy       Psych : Depression [place].Blacker  ]  Anxiety [ yes ]  Psych hospitalizations [no  ]                                        BP 120/73 mmHg  Pulse 72  Resp 20  Ht 5' 1.5" (1.562 m)  Wt 98 lb (44.453 kg)  BMI 18.22 kg/m2  SpO2 95% Physical Exam      Physical Exam  General: very thin petite Caucasian female with COPD no distress HEENT: Normocephalic pupils equal , dentition adequate Neck: Supple without JVD, adenopathy, or bruit Chest: Clear to auscultation, symmetrical breath sounds, no rhonchi, no tenderness             or deformity Cardiovascular: Regular rate and rhythm, very loud systolic ejection murmur, no gallop, peripheral pulses           not  palpable in lower extremities Abdomen:  Soft, nontender, no palpable mass or organomegaly Extremities: Warm, well-perfused, mild clubbing, no cyanosis edema or tenderness,              no venous stasis changes of the legs Rectal/GU: Deferred Neuro: Grossly non--focal and symmetrical throughout Skin: Clean and dry without rash or ulceration  Diagnostic Tests: Echocardiogram personally reviewed  Impression: Severe aortic stenosis by echocardiogram Mild symptoms-mainly intermittent but significant pedal edema and persistent weight loss  Plan:I recommended the patient undergo aortic valve replacement. She would need a bioprosthetic valve because of her history of duodenal ulcer disease and evidence of microcytic anemia and she would not be candidate for long-term anticoagulation.prior to  scheduling surgery she will need a right and left heart catheterization which will be arranged at [place]. This can be done as an outpatient. Prior to surgery she will also need to stop smoking and she understands this as her pulmonary risk is significant for sternotomy and aVR.  The patient will  Return  after cardiac catheterization to review the data and potentially schedule aVR surgery. Len Childs, MD [facility] [phone]

## 2015-08-17 DIAGNOSIS — M4120 Other idiopathic scoliosis, site unspecified: Secondary | ICD-10-CM | POA: Diagnosis not present

## 2015-08-17 DIAGNOSIS — M4806 Spinal stenosis, lumbar region: Secondary | ICD-10-CM | POA: Diagnosis not present

## 2015-08-17 DIAGNOSIS — M62838 Other muscle spasm: Secondary | ICD-10-CM | POA: Diagnosis not present

## 2015-08-17 DIAGNOSIS — M5416 Radiculopathy, lumbar region: Secondary | ICD-10-CM | POA: Diagnosis not present

## 2015-08-17 DIAGNOSIS — M5136 Other intervertebral disc degeneration, lumbar region: Secondary | ICD-10-CM | POA: Diagnosis not present

## 2015-08-17 DIAGNOSIS — M545 Low back pain: Secondary | ICD-10-CM | POA: Diagnosis not present

## 2015-08-22 DIAGNOSIS — Z23 Encounter for immunization: Secondary | ICD-10-CM | POA: Diagnosis not present

## 2015-09-01 ENCOUNTER — Telehealth (HOSPITAL_COMMUNITY): Payer: Self-pay | Admitting: *Deleted

## 2015-09-01 NOTE — Telephone Encounter (Signed)
Spoke directly with pt she is aware of her appt time for her L&R heart cath. Went over instructions for procedure.

## 2015-09-03 ENCOUNTER — Encounter (HOSPITAL_COMMUNITY)
Admission: RE | Disposition: A | Discharge status: Home / Self Care | Payer: Self-pay | Source: Ambulatory Visit | Attending: Internal Medicine

## 2015-09-03 ENCOUNTER — Ambulatory Visit (HOSPITAL_COMMUNITY)
Admission: RE | Admit: 2015-09-03 | Discharge: 2015-09-03 | Disposition: A | Discharge status: Home / Self Care | Payer: Medicare Other | Source: Ambulatory Visit | Attending: Internal Medicine | Admitting: Internal Medicine

## 2015-09-03 DIAGNOSIS — Z853 Personal history of malignant neoplasm of breast: Secondary | ICD-10-CM | POA: Diagnosis not present

## 2015-09-03 DIAGNOSIS — D509 Iron deficiency anemia, unspecified: Secondary | ICD-10-CM | POA: Insufficient documentation

## 2015-09-03 DIAGNOSIS — K219 Gastro-esophageal reflux disease without esophagitis: Secondary | ICD-10-CM | POA: Insufficient documentation

## 2015-09-03 DIAGNOSIS — I10 Essential (primary) hypertension: Secondary | ICD-10-CM | POA: Diagnosis not present

## 2015-09-03 DIAGNOSIS — Z8249 Family history of ischemic heart disease and other diseases of the circulatory system: Secondary | ICD-10-CM | POA: Diagnosis not present

## 2015-09-03 DIAGNOSIS — I350 Nonrheumatic aortic (valve) stenosis: Secondary | ICD-10-CM | POA: Diagnosis not present

## 2015-09-03 DIAGNOSIS — F17210 Nicotine dependence, cigarettes, uncomplicated: Secondary | ICD-10-CM | POA: Diagnosis not present

## 2015-09-03 DIAGNOSIS — J449 Chronic obstructive pulmonary disease, unspecified: Secondary | ICD-10-CM | POA: Insufficient documentation

## 2015-09-03 DIAGNOSIS — M19049 Primary osteoarthritis, unspecified hand: Secondary | ICD-10-CM | POA: Diagnosis not present

## 2015-09-03 DIAGNOSIS — F418 Other specified anxiety disorders: Secondary | ICD-10-CM | POA: Diagnosis not present

## 2015-09-03 DIAGNOSIS — I7300 Raynaud's syndrome without gangrene: Secondary | ICD-10-CM | POA: Diagnosis not present

## 2015-09-03 DIAGNOSIS — E780 Pure hypercholesterolemia: Secondary | ICD-10-CM | POA: Insufficient documentation

## 2015-09-03 DIAGNOSIS — G43909 Migraine, unspecified, not intractable, without status migrainosus: Secondary | ICD-10-CM | POA: Diagnosis not present

## 2015-09-03 HISTORY — PX: CARDIAC CATHETERIZATION: SHX172

## 2015-09-03 LAB — POCT I-STAT 3, VENOUS BLOOD GAS (G3P V)
ACID-BASE EXCESS: 1 mmol/L (ref 0.0–2.0)
Acid-Base Excess: 2 mmol/L (ref 0.0–2.0)
Bicarbonate: 27 mEq/L — ABNORMAL HIGH (ref 20.0–24.0)
Bicarbonate: 28.2 mEq/L — ABNORMAL HIGH (ref 20.0–24.0)
O2 SAT: 53 %
O2 Saturation: 55 %
PCO2 VEN: 49.8 mmHg (ref 45.0–50.0)
PH VEN: 7.361 — AB (ref 7.250–7.300)
PH VEN: 7.363 — AB (ref 7.250–7.300)
PO2 VEN: 30 mmHg (ref 30.0–45.0)
TCO2: 28 mmol/L (ref 0–100)
TCO2: 30 mmol/L (ref 0–100)
pCO2, Ven: 47.4 mmHg (ref 45.0–50.0)
pO2, Ven: 30 mmHg (ref 30.0–45.0)

## 2015-09-03 LAB — BASIC METABOLIC PANEL
ANION GAP: 10 (ref 5–15)
BUN: 15 mg/dL (ref 6–20)
CO2: 27 mmol/L (ref 22–32)
Calcium: 9.2 mg/dL (ref 8.9–10.3)
Chloride: 103 mmol/L (ref 101–111)
Creatinine, Ser: 0.55 mg/dL (ref 0.44–1.00)
Glucose, Bld: 79 mg/dL (ref 65–99)
POTASSIUM: 4.1 mmol/L (ref 3.5–5.1)
SODIUM: 140 mmol/L (ref 135–145)

## 2015-09-03 LAB — CBC
HCT: 35.1 % — ABNORMAL LOW (ref 36.0–46.0)
Hemoglobin: 11.1 g/dL — ABNORMAL LOW (ref 12.0–15.0)
MCH: 27.3 pg (ref 26.0–34.0)
MCHC: 31.6 g/dL (ref 30.0–36.0)
MCV: 86.5 fL (ref 78.0–100.0)
PLATELETS: 275 10*3/uL (ref 150–400)
RBC: 4.06 MIL/uL (ref 3.87–5.11)
WBC: 10.3 10*3/uL (ref 4.0–10.5)

## 2015-09-03 LAB — POCT I-STAT 3, ART BLOOD GAS (G3+)
Acid-Base Excess: 2 mmol/L (ref 0.0–2.0)
Bicarbonate: 27.2 mEq/L — ABNORMAL HIGH (ref 20.0–24.0)
O2 Saturation: 90 %
PH ART: 7.398 (ref 7.350–7.450)
TCO2: 29 mmol/L (ref 0–100)
pCO2 arterial: 44.2 mmHg (ref 35.0–45.0)
pO2, Arterial: 60 mmHg — ABNORMAL LOW (ref 80.0–100.0)

## 2015-09-03 LAB — PROTIME-INR
INR: 1 (ref 0.00–1.49)
PROTHROMBIN TIME: 13.4 s (ref 11.6–15.2)

## 2015-09-03 SURGERY — RIGHT/LEFT HEART CATH AND CORONARY ANGIOGRAPHY

## 2015-09-03 MED ORDER — MIDAZOLAM HCL 2 MG/2ML IJ SOLN
INTRAMUSCULAR | Status: DC | PRN
  Administered 2015-09-03: 1 mg via INTRAVENOUS

## 2015-09-03 MED ORDER — ONDANSETRON HCL 4 MG/2ML IJ SOLN: 4.0000 mg | Freq: Four times a day (QID) | INTRAMUSCULAR | Status: DC | PRN

## 2015-09-03 MED ORDER — SODIUM CHLORIDE 0.9 % IJ SOLN: 3.0000 mL | Freq: Two times a day (BID) | INTRAMUSCULAR | Status: DC

## 2015-09-03 MED ORDER — IOHEXOL 350 MG/ML SOLN
INTRAVENOUS | Status: DC | PRN
  Administered 2015-09-03: 30 mL via INTRA_ARTERIAL

## 2015-09-03 MED ORDER — MIDAZOLAM HCL 2 MG/2ML IJ SOLN
INTRAMUSCULAR | Status: AC
  Filled 2015-09-03: qty 4

## 2015-09-03 MED ORDER — HEPARIN (PORCINE) IN NACL 2-0.9 UNIT/ML-% IJ SOLN
INTRAMUSCULAR | Status: AC
  Filled 2015-09-03: qty 1500

## 2015-09-03 MED ORDER — SODIUM CHLORIDE 0.9 % IV SOLN: 250.0000 mL | INTRAVENOUS | Status: DC | PRN

## 2015-09-03 MED ORDER — GUAIFENESIN-DM 100-10 MG/5ML PO SYRP
5.0000 mL | ORAL_SOLUTION | ORAL | Status: DC | PRN
  Administered 2015-09-03: 5 mL via ORAL
  Filled 2015-09-03 (×2): qty 5

## 2015-09-03 MED ORDER — SODIUM CHLORIDE 0.9 % IJ SOLN: 3.0000 mL | INTRAMUSCULAR | Status: DC | PRN

## 2015-09-03 MED ORDER — FENTANYL CITRATE (PF) 100 MCG/2ML IJ SOLN
INTRAMUSCULAR | Status: AC
  Filled 2015-09-03: qty 4

## 2015-09-03 MED ORDER — LIDOCAINE HCL (PF) 1 % IJ SOLN
INTRAMUSCULAR | Status: DC | PRN
  Administered 2015-09-03: 09:00:00

## 2015-09-03 MED ORDER — SODIUM CHLORIDE 0.9 % IV SOLN
INTRAVENOUS | Status: DC
  Administered 2015-09-03: 07:00:00 via INTRAVENOUS

## 2015-09-03 MED ORDER — FENTANYL CITRATE (PF) 100 MCG/2ML IJ SOLN
INTRAMUSCULAR | Status: DC | PRN
  Administered 2015-09-03: 25 ug via INTRAVENOUS

## 2015-09-03 MED ORDER — ASPIRIN 81 MG PO CHEW
81.0000 mg | CHEWABLE_TABLET | ORAL | Status: AC
  Administered 2015-09-03: 81 mg via ORAL

## 2015-09-03 MED ORDER — LIDOCAINE HCL (PF) 1 % IJ SOLN
INTRAMUSCULAR | Status: AC
  Filled 2015-09-03: qty 30

## 2015-09-03 MED ORDER — ACETAMINOPHEN 325 MG PO TABS: 650.0000 mg | ORAL_TABLET | ORAL | Status: DC | PRN

## 2015-09-03 MED ORDER — SODIUM CHLORIDE 0.9 % WEIGHT BASED INFUSION: 3.0000 mL/kg/h | INTRAVENOUS | Status: AC

## 2015-09-03 MED ORDER — ASPIRIN 81 MG PO CHEW
CHEWABLE_TABLET | ORAL | Status: AC
  Filled 2015-09-03: qty 1

## 2015-09-03 SURGICAL SUPPLY — 10 items
CATH INFINITI 5FR MULTPACK ANG (CATHETERS) ×3 IMPLANT
CATH SWAN GANZ 7F STRAIGHT (CATHETERS) ×3 IMPLANT
KIT HEART LEFT (KITS) ×3 IMPLANT
KIT HEART RIGHT NAMIC (KITS) ×3 IMPLANT
PACK CARDIAC CATHETERIZATION (CUSTOM PROCEDURE TRAY) ×3 IMPLANT
SHEATH PINNACLE 5F 10CM (SHEATH) ×3 IMPLANT
SHEATH PINNACLE 7F 10CM (SHEATH) ×3 IMPLANT
TRANSDUCER W/STOPCOCK (MISCELLANEOUS) ×3 IMPLANT
WIRE EMERALD 3MM-J .025X260CM (WIRE) ×3 IMPLANT
WIRE EMERALD 3MM-J .035X150CM (WIRE) ×3 IMPLANT

## 2015-09-03 NOTE — H&P (View-Only) (Signed)
PCP is CONROY,NATHAN, PA-C Referring Provider is Conroy, Louisa, MD  Chief Complaint  Patient presents with  . Aortic Stenosis    Surgical eval, ECHO 07/28/15 @ [facility]  severe aortic stenosis on recent 2-D echocardiogram with a peak transvalvular gradient 100 mm mercury, mean transvalvular gradient 55 mmHg. Patient examined and the 2-D echocardiogram personally reviewed  HPI: 59-year-old Caucasian female smoker with history of cardiac murmur since age 23. Subsequent echocardiogram demonstrated that she had a bicuspid aortic valve. She had 2 uncomplicated pregnancies. She is been followed carefully by her primary care physician with serial echocardiograms. In 2014 8 transthoracic echocardiogram demonstrated moderate aortic stenosis with a peak gradient of 50 mm mercury. She was asymptomatic. Over the past 3-4 years the patient has had progressive weight loss and has had a duodenal ulcer diagnosed and treated since 2014. The patient remains fairly active in her [place] but is unable to regain weight. A repeat echocardiogram performed earlier this month demonstrates severe aortic stenosis with the gradients as noted above. The images show a heavily calcified valve very thickened. The LV outflow tract is approximately 18-20 mm. There is no significant mitral regurgitation or tricuspid regurgitation. LV systolic function is normal. There is mild LVH. The aortic root does not appear to be dilated.the patient is in a sinus rhythm. She specifically denies symptoms of chest pain or shortness of breath. She does have some palpitations and some symptoms of presyncope.She has noted significant pedal edema over the past 2 months.  The patient denies any active dental problems. She has partial plates. She had her teeth cleaned within the past year.  The patient is a heavy smoker. She still smoking just over one pack per day. She denies history current bronchitis or pneumonia. PFTs are pending.  Past Medical History   Diagnosis Date  . Hypertension   . Aortic valve disease   . Migraine   . Hypercholesterolemia   . Anxiety and depression   . Low back pain   . Iron deficiency anemia   . Osteoarthritis     hands  . Breast cancer     Dr Lewis Dr Linninger, right side 2011  . Raynaud's syndrome   . GERD (gastroesophageal reflux disease)     Past Surgical History  Procedure Laterality Date  . Breast surgery      right side  . Cesarean section    . Tubal ligation      Family History  Problem Relation Age of Onset  . COPD Father   . Heart disease Father   . Hyperlipidemia Father   . Hypertension Father   . Hypertension Mother   . Heart disease Sister   . Hyperlipidemia Sister   . Hypertension Sister   . COPD Brother   . Heart disease Brother   . Hyperlipidemia Brother   . Hypertension Brother     Social History Social History  Substance Use Topics  . Smoking status: Current Every Day Smoker -- 1.00 packs/day    Types: Cigarettes  . Smokeless tobacco: None  . Alcohol Use: No    Current Outpatient Prescriptions  Medication Sig Dispense Refill  . ALPRAZolam (XANAX) 1 MG tablet Take 1 mg by mouth at bedtime as needed for anxiety.    . citalopram (CELEXA) 20 MG tablet Take 20 mg by mouth daily.    . esomeprazole (NEXIUM) 40 MG capsule Take 40 mg by mouth daily at 12 noon.    . ferrous sulfate 325 (65 FE) MG tablet Take   325 mg by mouth daily with breakfast.    . hydrOXYzine (ATARAX/VISTARIL) 25 MG tablet Take 25 mg by mouth 2 (two) times daily.    . magnesium hydroxide (MILK OF MAGNESIA) 400 MG/5ML suspension Take by mouth daily as needed for mild constipation.    . OxyCODONE (OXYCONTIN) 20 mg T12A 12 hr tablet Take 20 mg by mouth every 12 (twelve) hours.    . oxyCODONE-acetaminophen (PERCOCET) 7.5-325 MG per tablet Take 1 tablet by mouth every 8 (eight) hours as needed for severe pain.    . promethazine (PHENERGAN) 25 MG tablet Take 25 mg by mouth every 6 (six) hours as needed for  nausea or vomiting.    . ranitidine (ZANTAC) 150 MG tablet Take 150 mg by mouth 2 (two) times daily.    . simvastatin (ZOCOR) 40 MG tablet Take 40 mg by mouth daily.    . solifenacin (VESICARE) 5 MG tablet Take 5 mg by mouth daily.    . SUMAtriptan (IMITREX) 100 MG tablet Take 100 mg by mouth every 2 (two) hours as needed for migraine. May repeat in 2 hours if headache persists or recurs.    . cyclobenzaprine (FLEXERIL) 10 MG tablet Take 10 mg by mouth 3 (three) times daily as needed for muscle spasms.     No current facility-administered medications for this visit.    No Known Allergies  Review of Systems         Review of Systems :  [ y ] = yes, [  ] = no        General :  Weight gain [   ]    Weight loss  [yes   ]  Fatigue [ yes ]  Fever [  no]  Chills  [no  ]                                Weakness  [ no ]           Cardiac :  Chest pain/ pressure [ no ]  Resting SOB [no  ] exertional SOB [  no]                        Orthopnea [no  ]  Pedal edema  [ yes ]  Palpitations [yes  ] Syncope/presyncope [yes ]                        Paroxysmal nocturnal dyspnea [ no ]        Pulmonary : cough [  ]  wheezing [  ]  Hemoptysis [  ] Sputum [  ] Snoring [  ]                              Pneumothorax [  ]  Sleep apnea [  ]       GI : Vomiting [  ]  Dysphagia [  ]  Melena  [  ]  Abdominal pain [  ] BRBPR [  ]positive history of duodenal ulcer 2014, currently with blood loss anemia hemoglobin 9.2              Heart burn [  ]  Constipation [  ] Diarrhea  [  ] Colonoscopy [  ]       GU : Hematuria [  ]    Dysuria [  ]  Nocturia [  ] UTI's [  ]       Vascular : Claudication [  ]  Rest pain [  ]  DVT [  ] Vein stripping [  ] leg ulcers [  ]                          TIA [  ] Stroke [  ]  Varicose veins [  ]       NEURO :  Headaches  [  ] Seizures [  ] Vision changes [  ] Paresthesias [  ]       Musculoskeletal :  Arthritis [  ] Gout  [  ]  Back pain [yes ]  Joint pain [  ]patient has scoliosis with  chronic back pain. 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 No chemotherapy       Psych : Depression [yes  ]  Anxiety [ yes ]  Psych hospitalizations [no  ]                                        BP 120/73 mmHg  Pulse 72  Resp 20  Ht 5' 1.5" (1.562 m)  Wt 98 lb (44.453 kg)  BMI 18.22 kg/m2  SpO2 95% Physical Exam      Physical Exam  General: very thin petite Caucasian female with COPD no distress HEENT: Normocephalic pupils equal , dentition adequate Neck: Supple without JVD, adenopathy, or bruit Chest: Clear to auscultation, symmetrical breath sounds, no rhonchi, no tenderness             or deformity Cardiovascular: Regular rate and rhythm, very loud systolic ejection murmur, no gallop, peripheral pulses           not  palpable in lower extremities Abdomen:  Soft, nontender, no palpable mass or organomegaly Extremities: Warm, well-perfused, mild clubbing, no cyanosis edema or tenderness,              no venous stasis changes of the legs Rectal/GU: Deferred Neuro: Grossly non--focal and symmetrical throughout Skin: Clean and dry without rash or ulceration  Diagnostic Tests: Echocardiogram personally reviewed  Impression: Severe aortic stenosis by echocardiogram Mild symptoms-mainly intermittent but significant pedal edema and persistent weight loss  Plan:I recommended the patient undergo aortic valve replacement. She would need a bioprosthetic valve because of her history of duodenal ulcer disease and evidence of microcytic anemia and she would not be candidate for long-term anticoagulation.prior to  scheduling surgery she will need a right and left heart catheterization which will be arranged at [place]. This can be done as an outpatient. Prior to surgery she will also need to stop smoking and she understands this as her pulmonary risk is significant for sternotomy and aVR.  The patient will  Return   after cardiac catheterization to review the data and potentially schedule aVR surgery. Deneen Slager Van Trigt III, MD [facility] [phone]  

## 2015-09-03 NOTE — Interval H&P Note (Signed)
History and Physical Interval Note:  09/03/2015 8:01 AM  Stephanie Velasquez  has presented today for surgery, with the diagnosis of arotic stenosis  The various methods of treatment have been discussed with the patient and family. After consideration of risks, benefits and other options for treatment, the patient has consented to  Procedure(s): Right/Left Heart Cath and Coronary Angiography (N/A) as a surgical intervention .  The patient's history has been reviewed, patient examined, no change in status, stable for surgery.  I have reviewed the patient's chart and labs.  Questions were answered to the patient's satisfaction.     Bensimhon, Quillian Quince

## 2015-09-03 NOTE — Discharge Instructions (Signed)

## 2015-09-03 NOTE — Progress Notes (Signed)
Site area: rt groin fa and fv shaths Site Prior to Removal:  Level  0 Pressure Applied For: 25 minutes Manual:   yes Patient Status During Pull:  Stable   Post Pull Site:  Level   0 Post Pull Instructions Given:  yes Post Pull Pulses Present: yes Dressing Applied:  tegaderm Bedrest begins @   0915 Comments:  Coughing; pharmacy called for cough syrup.

## 2015-09-06 ENCOUNTER — Encounter (HOSPITAL_COMMUNITY): Payer: Self-pay | Admitting: Internal Medicine

## 2015-09-08 ENCOUNTER — Encounter: Payer: Medicare Other | Admitting: Cardiothoracic Surgery

## 2015-09-08 DIAGNOSIS — Z23 Encounter for immunization: Secondary | ICD-10-CM | POA: Diagnosis not present

## 2015-09-14 ENCOUNTER — Other Ambulatory Visit: Payer: Self-pay | Admitting: Cardiothoracic Surgery

## 2015-09-14 DIAGNOSIS — I350 Nonrheumatic aortic (valve) stenosis: Secondary | ICD-10-CM

## 2015-09-15 ENCOUNTER — Ambulatory Visit (INDEPENDENT_AMBULATORY_CARE_PROVIDER_SITE_OTHER): Payer: Medicare Other | Admitting: Cardiothoracic Surgery

## 2015-09-15 ENCOUNTER — Other Ambulatory Visit: Payer: Self-pay | Admitting: *Deleted

## 2015-09-15 ENCOUNTER — Encounter: Payer: Self-pay | Admitting: Cardiothoracic Surgery

## 2015-09-15 ENCOUNTER — Ambulatory Visit
Admission: RE | Admit: 2015-09-15 | Discharge: 2015-09-15 | Disposition: A | Discharge status: Home / Self Care | Payer: Medicare Other | Source: Ambulatory Visit | Attending: Cardiothoracic Surgery | Admitting: Cardiothoracic Surgery

## 2015-09-15 VITALS — BP 121/72 | HR 64 | Resp 20 | Ht 61.5 in | Wt 100.0 lb

## 2015-09-15 DIAGNOSIS — I350 Nonrheumatic aortic (valve) stenosis: Secondary | ICD-10-CM

## 2015-09-15 DIAGNOSIS — J449 Chronic obstructive pulmonary disease, unspecified: Secondary | ICD-10-CM | POA: Diagnosis not present

## 2015-09-15 DIAGNOSIS — I10 Essential (primary) hypertension: Secondary | ICD-10-CM | POA: Diagnosis not present

## 2015-09-19 NOTE — Progress Notes (Signed)
PCP is Fae Pippin Referring Provider is Cyndi Bender, PA-C  Chief Complaint  Patient presents with  . Aortic Stenosis    Further discuss surgery, cardiac cath 09/03/15    [id number] returns for followup discussion of her severe aortic stenosis after recently undergoing right and left heart catheterization  Severe aortic stenosis with gradient 100 mm mercury and recent onset of pedal edema and weight loss BSA 1.4 COPD with active smoking History of breast cancer Normal coronaries by left heart cath Cardiac index 2.1 and right heart pressures normal by right heart cath Normal sinus rhythm No dental issues History of peptic ulcer disease precluding mechanical valve   Past Medical History  Diagnosis Date  . Hypertension   . Aortic valve disease   . Migraine   . Hypercholesterolemia   . Anxiety and depression   . Low back pain   . Iron deficiency anemia   . Osteoarthritis     hands  . Breast cancer     Dr Bobby Rumpf Dr Rogelia Boga, right side 2011  . Raynaud's syndrome   . GERD (gastroesophageal reflux disease)     Past Surgical History  Procedure Laterality Date  . Breast surgery      right side  . Cesarean section    . Tubal ligation    . Cardiac catheterization N/A 09/03/2015    Procedure: Right/Left Heart Cath and Coronary Angiography;  Surgeon: Jolaine Artist, MD;  Location: [facility];  Service: Cardiovascular;  Laterality: N/A;    Family History  Problem Relation Age of Onset  . COPD Father   . Heart disease Father   . Hyperlipidemia Father   . Hypertension Father   . Hypertension Mother   . Heart disease Sister   . Hyperlipidemia Sister   . Hypertension Sister   . COPD Brother   . Heart disease Brother   . Hyperlipidemia Brother   . Hypertension Brother     Social History Social History  Substance Use Topics  . Smoking status: Current Every Day Smoker -- 1.00 packs/day    Types: Cigarettes  . Smokeless tobacco: None  . Alcohol  Use: No    Current Outpatient Prescriptions  Medication Sig Dispense Refill  . ALPRAZolam (XANAX) 1 MG tablet Take 1 mg by mouth at bedtime as needed for anxiety.    . citalopram (CELEXA) 20 MG tablet Take 20 mg by mouth daily.    . cyclobenzaprine (FLEXERIL) 10 MG tablet Take 10 mg by mouth 3 (three) times daily as needed for muscle spasms.    Marland Kitchen esomeprazole (NEXIUM) 40 MG capsule Take 40 mg by mouth daily at 12 noon.    . ferrous sulfate 325 (65 FE) MG tablet Take 325 mg by mouth daily with breakfast.    . hydrOXYzine (ATARAX/VISTARIL) 25 MG tablet Take 25 mg by mouth 2 (two) times daily.    . magnesium hydroxide (MILK OF MAGNESIA) 400 MG/5ML suspension Take by mouth daily as needed for mild constipation.    . OxyCODONE (OXYCONTIN) 20 mg T12A 12 hr tablet Take 20 mg by mouth every 12 (twelve) hours.    Marland Kitchen oxyCODONE-acetaminophen (PERCOCET) 7.5-325 MG per tablet Take 1 tablet by mouth every 8 (eight) hours as needed for severe pain.    . promethazine (PHENERGAN) 25 MG tablet Take 25 mg by mouth every 6 (six) hours as needed for nausea or vomiting.    . ranitidine (ZANTAC) 150 MG tablet Take 150 mg by mouth 2 (two) times daily.    Marland Kitchen  simvastatin (ZOCOR) 40 MG tablet Take 40 mg by mouth daily.    . solifenacin (VESICARE) 5 MG tablet Take 5 mg by mouth daily.    . SUMAtriptan (IMITREX) 100 MG tablet Take 100 mg by mouth every 2 (two) hours as needed for migraine. May repeat in 2 hours if headache persists or recurs.     No current facility-administered medications for this visit.    No Known Allergies  Review of Systems  No change in since her previous exam  BP 121/72 mmHg  Pulse 64  Resp 20  Ht 5' 1.5" (1.562 m)  Wt 100 lb (45.36 kg)  BMI 18.59 kg/m2  SpO2 95% Physical Exam     Physical Exam  General: thin chronically ill Caucasian female with COPD and no distress HEENT: Normocephalic pupils equal , dentition adequate Neck: Supple without JVD, adenopathy, or bruit Chest: Clear  but distal breath sounds to auscultation, symmetrical breath sounds, no rhonchi, no tenderness             or deformity Cardiovascular: Regular rate and rhythm, 3/6 systolic ejection  murmur, no gallop, peripheral pulses             palpable in all extremities Abdomen:  Soft, nontender, no palpable mass or organomegaly Extremities: Warm, well-perfused, no clubbing cyanosis, mild edema without tenderness,              no venous stasis changes of the legs Rectal/GU: Deferred Neuro: Grossly non--focal and symmetrical throughout Skin: Clean and dry without rash or ulceration   Diagnostic Tests: Coronary angiograms and echocardiogram personally  reviewed  Impression/ plan: Severe aortic stenosis COPD with active smoking Small LV outflow tract History of acid peptic disease with GI bleeding Plan biologic aVR for severe aortic stenosis scheduled October 11     Len Childs, MD [facility] [phone]

## 2015-09-24 ENCOUNTER — Ambulatory Visit (HOSPITAL_COMMUNITY)
Admission: RE | Admit: 2015-09-24 | Discharge: 2015-09-24 | Disposition: A | Discharge status: Home / Self Care | Payer: Medicare Other | Source: Ambulatory Visit | Attending: Cardiothoracic Surgery | Admitting: Cardiothoracic Surgery

## 2015-09-24 ENCOUNTER — Encounter (HOSPITAL_COMMUNITY)
Admission: RE | Admit: 2015-09-24 | Discharge: 2015-09-24 | Disposition: A | Discharge status: Home / Self Care | Payer: Medicare Other | Source: Ambulatory Visit | Attending: Cardiothoracic Surgery | Admitting: Cardiothoracic Surgery

## 2015-09-24 ENCOUNTER — Encounter (HOSPITAL_COMMUNITY): Payer: Self-pay

## 2015-09-24 ENCOUNTER — Ambulatory Visit (HOSPITAL_BASED_OUTPATIENT_CLINIC_OR_DEPARTMENT_OTHER)
Admission: RE | Admit: 2015-09-24 | Discharge: 2015-09-24 | Disposition: A | Discharge status: Home / Self Care | Payer: Medicare Other | Source: Ambulatory Visit | Attending: Cardiothoracic Surgery | Admitting: Cardiothoracic Surgery

## 2015-09-24 VITALS — BP 114/70 | HR 68 | Temp 96.7°F | Resp 20 | Wt 98.2 lb

## 2015-09-24 DIAGNOSIS — I350 Nonrheumatic aortic (valve) stenosis: Secondary | ICD-10-CM | POA: Diagnosis not present

## 2015-09-24 DIAGNOSIS — I517 Cardiomegaly: Secondary | ICD-10-CM | POA: Insufficient documentation

## 2015-09-24 DIAGNOSIS — Z952 Presence of prosthetic heart valve: Secondary | ICD-10-CM | POA: Diagnosis not present

## 2015-09-24 DIAGNOSIS — Z01818 Encounter for other preprocedural examination: Secondary | ICD-10-CM | POA: Diagnosis not present

## 2015-09-24 HISTORY — DX: Monoplegia of upper limb affecting right dominant side: G83.21

## 2015-09-24 HISTORY — DX: Unspecified hearing loss, left ear: H91.92

## 2015-09-24 HISTORY — DX: Overactive bladder: N32.81

## 2015-09-24 LAB — PULMONARY FUNCTION TEST
DL/VA % pred: 108 %
DL/VA: 4.85 ml/min/mmHg/L
DLCO cor % pred: 80 %
DLCO cor: 16.88 ml/min/mmHg
DLCO unc % pred: 75 %
DLCO unc: 15.68 ml/min/mmHg
FEF 25-75 Post: 0.79 L/sec
FEF 25-75 Pre: 0.65 L/sec
FEF2575-%Change-Post: 20 %
FEF2575-%Pred-Post: 36 %
FEF2575-%Pred-Pre: 30 %
FEV1-%Change-Post: 2 %
FEV1-%Pred-Post: 50 %
FEV1-%Pred-Pre: 49 %
FEV1-Post: 1.16 L
FEV1-Pre: 1.13 L
FEV1FVC-%Change-Post: -1 %
FEV1FVC-%Pred-Pre: 78 %
FEV6-%Change-Post: 6 %
FEV6-%Pred-Post: 67 %
FEV6-%Pred-Pre: 62 %
FEV6-Post: 1.92 L
FEV6-Pre: 1.79 L
FEV6FVC-%Change-Post: 2 %
FEV6FVC-%Pred-Post: 104 %
FEV6FVC-%Pred-Pre: 101 %
FVC-%Change-Post: 4 %
FVC-%Pred-Post: 64 %
FVC-%Pred-Pre: 62 %
FVC-Post: 1.92 L
FVC-Pre: 1.84 L
Post FEV1/FVC ratio: 60 %
Post FEV6/FVC ratio: 100 %
Pre FEV1/FVC ratio: 61 %
Pre FEV6/FVC Ratio: 97 %
RV % pred: 154 %
RV: 2.91 L
TLC % pred: 103 %
TLC: 4.86 L

## 2015-09-24 LAB — PROTIME-INR
INR: 1.01 (ref 0.00–1.49)
Prothrombin Time: 13.5 seconds (ref 11.6–15.2)

## 2015-09-24 LAB — URINALYSIS, ROUTINE W REFLEX MICROSCOPIC
Bilirubin Urine: NEGATIVE
Glucose, UA: NEGATIVE mg/dL
Hgb urine dipstick: NEGATIVE
Ketones, ur: NEGATIVE mg/dL
Leukocytes, UA: NEGATIVE
Nitrite: NEGATIVE
Protein, ur: NEGATIVE mg/dL
Specific Gravity, Urine: 1.035 — ABNORMAL HIGH (ref 1.005–1.030)
Urobilinogen, UA: 0.2 mg/dL (ref 0.0–1.0)
pH: 5.5 (ref 5.0–8.0)

## 2015-09-24 LAB — BLOOD GAS, ARTERIAL
Acid-Base Excess: 1.4 mmol/L (ref 0.0–2.0)
Bicarbonate: 25.7 mEq/L — ABNORMAL HIGH (ref 20.0–24.0)
Drawn by: 206361
O2 Content: 0.2 L/min
O2 Saturation: 95.7 %
Patient temperature: 98.6
TCO2: 27 mmol/L (ref 0–100)
pCO2 arterial: 42.2 mmHg (ref 35.0–45.0)
pH, Arterial: 7.402 (ref 7.350–7.450)
pO2, Arterial: 77.3 mmHg — ABNORMAL LOW (ref 80.0–100.0)

## 2015-09-24 LAB — COMPREHENSIVE METABOLIC PANEL
ALT: 21 U/L (ref 14–54)
AST: 33 U/L (ref 15–41)
Albumin: 3.6 g/dL (ref 3.5–5.0)
Alkaline Phosphatase: 31 U/L — ABNORMAL LOW (ref 38–126)
Anion gap: 13 (ref 5–15)
BUN: 17 mg/dL (ref 6–20)
CO2: 23 mmol/L (ref 22–32)
Calcium: 9.1 mg/dL (ref 8.9–10.3)
Chloride: 104 mmol/L (ref 101–111)
Creatinine, Ser: 0.51 mg/dL (ref 0.44–1.00)
GFR calc Af Amer: >60 mL/min (ref >60)
GFR calc non Af Amer: >60 mL/min (ref >60)
Glucose, Bld: 82 mg/dL (ref 65–99)
Potassium: 4.3 mmol/L (ref 3.5–5.1)
Sodium: 140 mmol/L (ref 135–145)
Total Bilirubin: <0.1 mg/dL — ABNORMAL LOW (ref 0.3–1.2)
Total Protein: 6 g/dL — ABNORMAL LOW (ref 6.5–8.1)

## 2015-09-24 LAB — CBC
HCT: 35.9 % — ABNORMAL LOW (ref 36.0–46.0)
Hemoglobin: 11.3 g/dL — ABNORMAL LOW (ref 12.0–15.0)
MCH: 27.6 pg (ref 26.0–34.0)
MCHC: 31.5 g/dL (ref 30.0–36.0)
MCV: 87.8 fL (ref 78.0–100.0)
Platelets: 254 10*3/uL (ref 150–400)
RBC: 4.09 MIL/uL (ref 3.87–5.11)
RDW: 23.3 % — ABNORMAL HIGH (ref 11.5–15.5)
WBC: 5.3 10*3/uL (ref 4.0–10.5)

## 2015-09-24 LAB — APTT: aPTT: 29 seconds (ref 24–37)

## 2015-09-24 LAB — ABO/RH: ABO/RH(D): A POS

## 2015-09-24 LAB — SURGICAL PCR SCREEN
MRSA, PCR: NEGATIVE
Staphylococcus aureus: NEGATIVE

## 2015-09-24 MED ORDER — ALBUTEROL SULFATE (2.5 MG/3ML) 0.083% IN NEBU
2.5000 mg | INHALATION_SOLUTION | Freq: Once | RESPIRATORY_TRACT | Status: AC
  Administered 2015-09-24: 2.5 mg via RESPIRATORY_TRACT

## 2015-09-24 NOTE — Pre-Procedure Instructions (Signed)
    Marleena Shubert  09/24/2015      [facility] - [place], [address] AT [address] [place] [address] Phone: [phone] Fax: [phone]    Your procedure is scheduled on Thursday, October 13th, 2016.  Report to [facility] Admitting at 5:30 A.M.  Call this number if you have problems the morning of surgery:  [phone]   Remember:  Do not eat food or drink liquids after midnight.   Take these medicines the morning of surgery with A SIP OF WATER: Oxycodone (Oxycontin), Oxycodone-acetaminophen (percocet), Promethazine (Phenergan)  7 days prior to surgery, stop taking the following: Aspirin-Acetaminophen-caffeine Gabriel Earing), Aspirin, NSAIDs, Ibuprofen, Advil, Motrin, BC's, Naproxen, Aleve, Fish oil, all herbal medications, and all vitamins.     Do not wear jewelry, make-up or nail polish.  Do not wear lotions, powders, or perfumes.  You may NOT wear deodorant.  Do not shave 48 hours prior to surgery.    Do not bring valuables to the hospital.  [facility] is not responsible for any belongings or valuables.  Contacts, dentures or bridgework may not be worn into surgery.  Leave your suitcase in the car.  After surgery it may be brought to your room.  For patients admitted to the hospital, discharge time will be determined by your treatment team.  Patients discharged the day of surgery will not be allowed to drive home.   Special instructions:  See attached.   Please read over the following fact sheets that you were given. Pain Booklet, Coughing and Deep Breathing, Blood Transfusion Information, MRSA Information and Surgical Site Infection Prevention

## 2015-09-24 NOTE — Progress Notes (Signed)
Pre-op Cardiac Surgery  Carotid Findings:  1-39% ICA stenosis.  Vertebral artery flow is antegrade.   Upper Extremity Right Left  Brachial Pressures restricted 128T  Radial Waveforms T T  Ulnar Waveforms T T  Palmar Arch (Allen's Test) Doppler signal obliterates with radial compression and remains normal with ulnar compression Doppler signal remains normal with radial compression and obliterates with ulnar compression   Findings:      Lower  Extremity Right Left  Dorsalis Pedis    Anterior Tibial    Posterior Tibial    Ankle/Brachial Indices      Findings:

## 2015-09-24 NOTE — Progress Notes (Signed)
PCP - Cyndi Bender, PA Cardiologist - denies  EKG- 09/24/15  CXR - 09/24/15  Echo- 07/2015 Stress Test - denies Cardiac Cath - 08/2015 - Epic  Patient denies shortness of breath and chest pain at PAT appointment.

## 2015-09-25 LAB — HEMOGLOBIN A1C
Hgb A1c MFr Bld: 5.5 % (ref 4.8–5.6)
Mean Plasma Glucose: 111 mg/dL

## 2015-09-27 ENCOUNTER — Encounter (HOSPITAL_COMMUNITY): Payer: Self-pay

## 2015-09-27 NOTE — Progress Notes (Signed)
Anesthesia Chart Review: Patient is a 59 year old female scheduled for AVR by Dr. Prescott Gum on 09/30/15.  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 PCP is Cyndi Bender, PA-C.  09/03/15 Cardiac cath: Assessment: 1. Normal coronaries with very large RCA  2. Normal pressures 3. Mildly depressed cardiac output in the setting of severe AS Plan: Proceed with AVR with Dr. Prescott Gum.   07/28/15 Echo: Mild concentric LVH. LVEF > 70%. Bicuspid AV with severe AS. Mild AR. Peak/mean pressure gradient of 98/55 MMHg. AVA 0.66 cm2. (Scanned copy if very diffcult to read, so I'll request another copy from [facility].)   09/24/15 Carotid duplex: Bilateral: intimal wall thickening CCA. Mild soft plaque origin ICA. 1-39% ICA plaquing. Vertebral artery flow is antegrade.  Preoperative EKG, CXR, PFT, labs noted.   If no acute changes then I anticipate that she can proceed as planned.  [facility] Short Stay Center/Anesthesiology Phone [phone] 09/27/2015 9:41 AM

## 2015-09-29 MED ORDER — CHLORHEXIDINE GLUCONATE 0.12 % MT SOLN
15.0000 mL | Freq: Once | OROMUCOSAL | Status: DC
  Filled 2015-09-29: qty 15

## 2015-09-29 MED ORDER — SODIUM CHLORIDE 0.9 % IV SOLN
INTRAVENOUS | Status: AC
  Administered 2015-09-30: 69 mL/h via INTRAVENOUS
  Filled 2015-09-29: qty 40

## 2015-09-29 MED ORDER — DEXMEDETOMIDINE HCL IN NACL 400 MCG/100ML IV SOLN
0.1000 ug/kg/h | INTRAVENOUS | Status: AC
Start: 2015-09-30 — End: 2015-09-30
  Administered 2015-09-30: .7 ug/kg/h via INTRAVENOUS
  Filled 2015-09-29: qty 100

## 2015-09-29 MED ORDER — SODIUM CHLORIDE 0.9 % IV SOLN
INTRAVENOUS | Status: AC
  Administered 2015-09-30: 1.1 [IU]/h via INTRAVENOUS
  Filled 2015-09-29: qty 2.5

## 2015-09-29 MED ORDER — POTASSIUM CHLORIDE 2 MEQ/ML IV SOLN
80.0000 meq | INTRAVENOUS | Status: DC
  Filled 2015-09-29: qty 40

## 2015-09-29 MED ORDER — DEXTROSE 5 % IV SOLN
750.0000 mg | INTRAVENOUS | Status: DC
  Filled 2015-09-29: qty 750

## 2015-09-29 MED ORDER — EPINEPHRINE HCL 1 MG/ML IJ SOLN
0.0000 ug/min | INTRAMUSCULAR | Status: DC
  Filled 2015-09-29: qty 4

## 2015-09-29 MED ORDER — DEXTROSE 5 % IV SOLN
1.5000 g | INTRAVENOUS | Status: AC
  Administered 2015-09-30: .75 g via INTRAVENOUS
  Administered 2015-09-30: 1.5 g via INTRAVENOUS
  Filled 2015-09-29: qty 1.5

## 2015-09-29 MED ORDER — SODIUM CHLORIDE 0.9 % IV SOLN
INTRAVENOUS | Status: DC
  Filled 2015-09-29: qty 30

## 2015-09-29 MED ORDER — DOPAMINE-DEXTROSE 3.2-5 MG/ML-% IV SOLN
0.0000 ug/kg/min | INTRAVENOUS | Status: DC
Start: 2015-09-30 — End: 2015-09-30
  Filled 2015-09-29: qty 250

## 2015-09-29 MED ORDER — NITROGLYCERIN IN D5W 200-5 MCG/ML-% IV SOLN
2.0000 ug/min | INTRAVENOUS | Status: AC
  Administered 2015-09-30: 5 ug/min via INTRAVENOUS
  Filled 2015-09-29: qty 250

## 2015-09-29 MED ORDER — METOPROLOL TARTRATE 12.5 MG HALF TABLET
12.5000 mg | ORAL_TABLET | ORAL | Status: AC
  Administered 2015-09-30: 12.5 mg via ORAL
  Filled 2015-09-29: qty 1

## 2015-09-29 MED ORDER — PHENYLEPHRINE HCL 10 MG/ML IJ SOLN
30.0000 ug/min | INTRAVENOUS | Status: AC
  Administered 2015-09-30: 40 ug/min via INTRAVENOUS
  Filled 2015-09-29: qty 2

## 2015-09-29 MED ORDER — PLASMA-LYTE 148 IV SOLN
INTRAVENOUS | Status: AC
  Administered 2015-09-30: 500 mL
  Filled 2015-09-29: qty 2.5

## 2015-09-29 MED ORDER — VANCOMYCIN HCL IN DEXTROSE 1-5 GM/200ML-% IV SOLN
1000.0000 mg | INTRAVENOUS | Status: AC
  Administered 2015-09-30: 1000 mg via INTRAVENOUS
  Filled 2015-09-29: qty 200

## 2015-09-29 MED ORDER — MAGNESIUM SULFATE 50 % IJ SOLN
40.0000 meq | INTRAMUSCULAR | Status: DC
  Filled 2015-09-29: qty 10

## 2015-09-30 ENCOUNTER — Encounter (HOSPITAL_COMMUNITY)
Admission: RE | Disposition: A | Discharge status: Home / Self Care | Payer: Medicare Other | Source: Ambulatory Visit | Attending: Cardiothoracic Surgery

## 2015-09-30 ENCOUNTER — Inpatient Hospital Stay (HOSPITAL_COMMUNITY): Payer: Medicare Other | Admitting: Vascular Surgery

## 2015-09-30 ENCOUNTER — Inpatient Hospital Stay (HOSPITAL_COMMUNITY): Payer: Medicare Other

## 2015-09-30 ENCOUNTER — Inpatient Hospital Stay (HOSPITAL_COMMUNITY): Payer: Medicare Other | Admitting: Anesthesiology

## 2015-09-30 ENCOUNTER — Inpatient Hospital Stay (HOSPITAL_COMMUNITY)
Admission: RE | Admit: 2015-09-30 | Discharge: 2015-10-07 | DRG: 220 | Disposition: A | Discharge status: Home Health | Payer: Medicare Other | Source: Ambulatory Visit | Attending: Cardiothoracic Surgery | Admitting: Cardiothoracic Surgery

## 2015-09-30 ENCOUNTER — Encounter (HOSPITAL_COMMUNITY): Payer: Self-pay | Admitting: *Deleted

## 2015-09-30 DIAGNOSIS — Z681 Body mass index (BMI) 19 or less, adult: Secondary | ICD-10-CM | POA: Diagnosis not present

## 2015-09-30 DIAGNOSIS — I358 Other nonrheumatic aortic valve disorders: Secondary | ICD-10-CM | POA: Diagnosis not present

## 2015-09-30 DIAGNOSIS — I10 Essential (primary) hypertension: Secondary | ICD-10-CM | POA: Diagnosis present

## 2015-09-30 DIAGNOSIS — I959 Hypotension, unspecified: Secondary | ICD-10-CM | POA: Diagnosis not present

## 2015-09-30 DIAGNOSIS — Q230 Congenital stenosis of aortic valve: Secondary | ICD-10-CM

## 2015-09-30 DIAGNOSIS — H9192 Unspecified hearing loss, left ear: Secondary | ICD-10-CM | POA: Diagnosis present

## 2015-09-30 DIAGNOSIS — G839 Paralytic syndrome, unspecified: Secondary | ICD-10-CM | POA: Diagnosis present

## 2015-09-30 DIAGNOSIS — K219 Gastro-esophageal reflux disease without esophagitis: Secondary | ICD-10-CM | POA: Diagnosis present

## 2015-09-30 DIAGNOSIS — J90 Pleural effusion, not elsewhere classified: Secondary | ICD-10-CM | POA: Diagnosis not present

## 2015-09-30 DIAGNOSIS — R0602 Shortness of breath: Secondary | ICD-10-CM | POA: Diagnosis not present

## 2015-09-30 DIAGNOSIS — J9811 Atelectasis: Secondary | ICD-10-CM | POA: Diagnosis not present

## 2015-09-30 DIAGNOSIS — Z853 Personal history of malignant neoplasm of breast: Secondary | ICD-10-CM | POA: Diagnosis not present

## 2015-09-30 DIAGNOSIS — Z8711 Personal history of peptic ulcer disease: Secondary | ICD-10-CM

## 2015-09-30 DIAGNOSIS — R634 Abnormal weight loss: Secondary | ICD-10-CM | POA: Diagnosis present

## 2015-09-30 DIAGNOSIS — M545 Low back pain: Secondary | ICD-10-CM | POA: Diagnosis not present

## 2015-09-30 DIAGNOSIS — Q2381 Bicuspid aortic valve: Secondary | ICD-10-CM | POA: Insufficient documentation

## 2015-09-30 DIAGNOSIS — E8770 Fluid overload, unspecified: Secondary | ICD-10-CM | POA: Diagnosis not present

## 2015-09-30 DIAGNOSIS — I350 Nonrheumatic aortic (valve) stenosis: Secondary | ICD-10-CM | POA: Insufficient documentation

## 2015-09-30 DIAGNOSIS — D509 Iron deficiency anemia, unspecified: Secondary | ICD-10-CM | POA: Diagnosis present

## 2015-09-30 DIAGNOSIS — D62 Acute posthemorrhagic anemia: Secondary | ICD-10-CM | POA: Diagnosis not present

## 2015-09-30 DIAGNOSIS — J939 Pneumothorax, unspecified: Secondary | ICD-10-CM

## 2015-09-30 DIAGNOSIS — Z4682 Encounter for fitting and adjustment of non-vascular catheter: Secondary | ICD-10-CM | POA: Diagnosis not present

## 2015-09-30 DIAGNOSIS — Z952 Presence of prosthetic heart valve: Secondary | ICD-10-CM

## 2015-09-30 DIAGNOSIS — Z7951 Long term (current) use of inhaled steroids: Secondary | ICD-10-CM | POA: Diagnosis not present

## 2015-09-30 DIAGNOSIS — F17200 Nicotine dependence, unspecified, uncomplicated: Secondary | ICD-10-CM | POA: Diagnosis present

## 2015-09-30 DIAGNOSIS — E7800 Pure hypercholesterolemia, unspecified: Secondary | ICD-10-CM | POA: Diagnosis present

## 2015-09-30 DIAGNOSIS — J449 Chronic obstructive pulmonary disease, unspecified: Secondary | ICD-10-CM | POA: Diagnosis present

## 2015-09-30 DIAGNOSIS — Q231 Congenital insufficiency of aortic valve: Principal | ICD-10-CM

## 2015-09-30 HISTORY — PX: TEE WITHOUT CARDIOVERSION: SHX5443

## 2015-09-30 HISTORY — PX: AORTIC VALVE REPLACEMENT: SHX41

## 2015-09-30 LAB — POCT I-STAT, CHEM 8
BUN: 15 mg/dL (ref 6–20)
BUN: 15 mg/dL (ref 6–20)
BUN: 14 mg/dL (ref 6–20)
BUN: 13 mg/dL (ref 6–20)
BUN: 12 mg/dL (ref 6–20)
BUN: 10 mg/dL (ref 6–20)
BUN: 9 mg/dL (ref 6–20)
Calcium, Ion: 1.17 mmol/L (ref 1.13–1.30)
Calcium, Ion: 1.13 mmol/L (ref 1.13–1.30)
Calcium, Ion: 1.03 mmol/L — ABNORMAL LOW (ref 1.13–1.30)
Calcium, Ion: 1.06 mmol/L — ABNORMAL LOW (ref 1.13–1.30)
Calcium, Ion: 1.47 mmol/L — ABNORMAL HIGH (ref 1.13–1.30)
Calcium, Ion: 1.26 mmol/L (ref 1.13–1.30)
Calcium, Ion: 1.27 mmol/L (ref 1.13–1.30)
Chloride: 106 mmol/L (ref 101–111)
Chloride: 103 mmol/L (ref 101–111)
Chloride: 104 mmol/L (ref 101–111)
Chloride: 105 mmol/L (ref 101–111)
Chloride: 106 mmol/L (ref 101–111)
Chloride: 103 mmol/L (ref 101–111)
Chloride: 105 mmol/L (ref 101–111)
Creatinine, Ser: 0.4 mg/dL — ABNORMAL LOW (ref 0.44–1.00)
Creatinine, Ser: 0.4 mg/dL — ABNORMAL LOW (ref 0.44–1.00)
Creatinine, Ser: 0.4 mg/dL — ABNORMAL LOW (ref 0.44–1.00)
Creatinine, Ser: 0.4 mg/dL — ABNORMAL LOW (ref 0.44–1.00)
Creatinine, Ser: 0.3 mg/dL — ABNORMAL LOW (ref 0.44–1.00)
Creatinine, Ser: 0.4 mg/dL — ABNORMAL LOW (ref 0.44–1.00)
Creatinine, Ser: 0.4 mg/dL — ABNORMAL LOW (ref 0.44–1.00)
Glucose, Bld: 115 mg/dL — ABNORMAL HIGH (ref 65–99)
Glucose, Bld: 123 mg/dL — ABNORMAL HIGH (ref 65–99)
Glucose, Bld: 121 mg/dL — ABNORMAL HIGH (ref 65–99)
Glucose, Bld: 120 mg/dL — ABNORMAL HIGH (ref 65–99)
Glucose, Bld: 155 mg/dL — ABNORMAL HIGH (ref 65–99)
Glucose, Bld: 152 mg/dL — ABNORMAL HIGH (ref 65–99)
Glucose, Bld: 152 mg/dL — ABNORMAL HIGH (ref 65–99)
HCT: 31 % — ABNORMAL LOW (ref 36.0–46.0)
HCT: 31 % — ABNORMAL LOW (ref 36.0–46.0)
HCT: 23 % — ABNORMAL LOW (ref 36.0–46.0)
HCT: 24 % — ABNORMAL LOW (ref 36.0–46.0)
HCT: 26 % — ABNORMAL LOW (ref 36.0–46.0)
HCT: 24 % — ABNORMAL LOW (ref 36.0–46.0)
HCT: 26 % — ABNORMAL LOW (ref 36.0–46.0)
Hemoglobin: 10.5 g/dL — ABNORMAL LOW (ref 12.0–15.0)
Hemoglobin: 10.5 g/dL — ABNORMAL LOW (ref 12.0–15.0)
Hemoglobin: 7.8 g/dL — ABNORMAL LOW (ref 12.0–15.0)
Hemoglobin: 8.2 g/dL — ABNORMAL LOW (ref 12.0–15.0)
Hemoglobin: 8.8 g/dL — ABNORMAL LOW (ref 12.0–15.0)
Hemoglobin: 8.2 g/dL — ABNORMAL LOW (ref 12.0–15.0)
Hemoglobin: 8.8 g/dL — ABNORMAL LOW (ref 12.0–15.0)
Potassium: 3.5 mmol/L (ref 3.5–5.1)
Potassium: 3.6 mmol/L (ref 3.5–5.1)
Potassium: 6.2 mmol/L (ref 3.5–5.1)
Potassium: 3.9 mmol/L (ref 3.5–5.1)
Potassium: 3.6 mmol/L (ref 3.5–5.1)
Potassium: 3.7 mmol/L (ref 3.5–5.1)
Potassium: 3.7 mmol/L (ref 3.5–5.1)
Sodium: 140 mmol/L (ref 135–145)
Sodium: 139 mmol/L (ref 135–145)
Sodium: 137 mmol/L (ref 135–145)
Sodium: 138 mmol/L (ref 135–145)
Sodium: 139 mmol/L (ref 135–145)
Sodium: 135 mmol/L (ref 135–145)
Sodium: 136 mmol/L (ref 135–145)
TCO2: 28 mmol/L (ref 0–100)
TCO2: 29 mmol/L (ref 0–100)
TCO2: 28 mmol/L (ref 0–100)
TCO2: 27 mmol/L (ref 0–100)
TCO2: 26 mmol/L (ref 0–100)
TCO2: 22 mmol/L (ref 0–100)
TCO2: 22 mmol/L (ref 0–100)

## 2015-09-30 LAB — POCT I-STAT 3, ART BLOOD GAS (G3+)
Acid-Base Excess: 4 mmol/L — ABNORMAL HIGH (ref 0.0–2.0)
Acid-base deficit: 1 mmol/L (ref 0.0–2.0)
Acid-base deficit: 2 mmol/L (ref 0.0–2.0)
Acid-base deficit: 2 mmol/L (ref 0.0–2.0)
Acid-base deficit: 3 mmol/L — ABNORMAL HIGH (ref 0.0–2.0)
Bicarbonate: 29.1 mEq/L — ABNORMAL HIGH (ref 20.0–24.0)
Bicarbonate: 25.8 mEq/L — ABNORMAL HIGH (ref 20.0–24.0)
Bicarbonate: 25.9 mEq/L — ABNORMAL HIGH (ref 20.0–24.0)
Bicarbonate: 23.7 mEq/L (ref 20.0–24.0)
Bicarbonate: 24.1 mEq/L — ABNORMAL HIGH (ref 20.0–24.0)
O2 Saturation: 100 %
O2 Saturation: 100 %
O2 Saturation: 100 %
O2 Saturation: 100 %
O2 Saturation: 97 %
Patient temperature: 36.5
Patient temperature: 35.8
Patient temperature: 37.4
TCO2: 30 mmol/L (ref 0–100)
TCO2: 27 mmol/L (ref 0–100)
TCO2: 28 mmol/L (ref 0–100)
TCO2: 25 mmol/L (ref 0–100)
TCO2: 26 mmol/L (ref 0–100)
pCO2 arterial: 45.8 mmHg — ABNORMAL HIGH (ref 35.0–45.0)
pCO2 arterial: 54.5 mmHg — ABNORMAL HIGH (ref 35.0–45.0)
pCO2 arterial: 62.9 mmHg (ref 35.0–45.0)
pCO2 arterial: 43.7 mmHg (ref 35.0–45.0)
pCO2 arterial: 52.9 mmHg — ABNORMAL HIGH (ref 35.0–45.0)
pH, Arterial: 7.411 (ref 7.350–7.450)
pH, Arterial: 7.283 — ABNORMAL LOW (ref 7.350–7.450)
pH, Arterial: 7.221 — ABNORMAL LOW (ref 7.350–7.450)
pH, Arterial: 7.337 — ABNORMAL LOW (ref 7.350–7.450)
pH, Arterial: 7.268 — ABNORMAL LOW (ref 7.350–7.450)
pO2, Arterial: 413 mmHg — ABNORMAL HIGH (ref 80.0–100.0)
pO2, Arterial: 475 mmHg — ABNORMAL HIGH (ref 80.0–100.0)
pO2, Arterial: 220 mmHg — ABNORMAL HIGH (ref 80.0–100.0)
pO2, Arterial: 197 mmHg — ABNORMAL HIGH (ref 80.0–100.0)
pO2, Arterial: 106 mmHg — ABNORMAL HIGH (ref 80.0–100.0)

## 2015-09-30 LAB — GLUCOSE, CAPILLARY
Glucose-Capillary: 82 mg/dL (ref 65–99)
Glucose-Capillary: 64 mg/dL — ABNORMAL LOW (ref 65–99)
Glucose-Capillary: 86 mg/dL (ref 65–99)
Glucose-Capillary: 106 mg/dL — ABNORMAL HIGH (ref 65–99)
Glucose-Capillary: 146 mg/dL — ABNORMAL HIGH (ref 65–99)

## 2015-09-30 LAB — HEMOGLOBIN AND HEMATOCRIT, BLOOD
HCT: 23.5 % — ABNORMAL LOW (ref 36.0–46.0)
Hemoglobin: 7.6 g/dL — ABNORMAL LOW (ref 12.0–15.0)

## 2015-09-30 LAB — POCT I-STAT 4, (NA,K, GLUC, HGB,HCT)
Glucose, Bld: 58 mg/dL — ABNORMAL LOW (ref 65–99)
HCT: 30 % — ABNORMAL LOW (ref 36.0–46.0)
Hemoglobin: 10.2 g/dL — ABNORMAL LOW (ref 12.0–15.0)
Potassium: 3.2 mmol/L — ABNORMAL LOW (ref 3.5–5.1)
Sodium: 141 mmol/L (ref 135–145)

## 2015-09-30 LAB — CBC
HCT: 29.7 % — ABNORMAL LOW (ref 36.0–46.0)
HCT: 26 % — ABNORMAL LOW (ref 36.0–46.0)
HEMOGLOBIN: 9.6 g/dL — AB (ref 12.0–15.0)
Hemoglobin: 8.4 g/dL — ABNORMAL LOW (ref 12.0–15.0)
MCH: 28.9 pg (ref 26.0–34.0)
MCH: 28.5 pg (ref 26.0–34.0)
MCHC: 32.3 g/dL (ref 30.0–36.0)
MCHC: 32.3 g/dL (ref 30.0–36.0)
MCV: 89.5 fL (ref 78.0–100.0)
MCV: 88.1 fL (ref 78.0–100.0)
Platelets: 155 10*3/uL (ref 150–400)
Platelets: 141 10*3/uL — ABNORMAL LOW (ref 150–400)
RBC: 3.32 MIL/uL — AB (ref 3.87–5.11)
RBC: 2.95 MIL/uL — ABNORMAL LOW (ref 3.87–5.11)
RDW: 21.2 % — ABNORMAL HIGH (ref 11.5–15.5)
RDW: 21.1 % — ABNORMAL HIGH (ref 11.5–15.5)
WBC: 10.1 10*3/uL (ref 4.0–10.5)
WBC: 10.1 10*3/uL (ref 4.0–10.5)

## 2015-09-30 LAB — CREATININE, SERUM
Creatinine, Ser: 0.41 mg/dL — ABNORMAL LOW (ref 0.44–1.00)
GFR calc Af Amer: >60 mL/min (ref >60)
GFR calc non Af Amer: >60 mL/min (ref >60)

## 2015-09-30 LAB — PROTIME-INR
INR: 1.68 — AB (ref 0.00–1.49)
INR: 1.73 — ABNORMAL HIGH (ref 0.00–1.49)
PROTHROMBIN TIME: 19.8 s — AB (ref 11.6–15.2)
Prothrombin Time: 20.3 seconds — ABNORMAL HIGH (ref 11.6–15.2)

## 2015-09-30 LAB — PLATELET COUNT: Platelets: 195 10*3/uL (ref 150–400)

## 2015-09-30 LAB — PREPARE RBC (CROSSMATCH)

## 2015-09-30 LAB — APTT
APTT: 41 s — AB (ref 24–37)
aPTT: 35 seconds (ref 24–37)

## 2015-09-30 LAB — FIBRINOGEN: Fibrinogen: 161 mg/dL — ABNORMAL LOW (ref 204–475)

## 2015-09-30 LAB — MAGNESIUM: Magnesium: 2.3 mg/dL (ref 1.7–2.4)

## 2015-09-30 SURGERY — REPLACEMENT, AORTIC VALVE, OPEN
Anesthesia: General | Site: Chest

## 2015-09-30 MED ORDER — PROTAMINE SULFATE 10 MG/ML IV SOLN
INTRAVENOUS | Status: AC
  Filled 2015-09-30: qty 25

## 2015-09-30 MED ORDER — MORPHINE SULFATE (PF) 2 MG/ML IV SOLN
1.0000 mg | INTRAVENOUS | Status: AC | PRN
  Administered 2015-09-30 (×3): 2 mg via INTRAVENOUS
  Filled 2015-09-30: qty 1

## 2015-09-30 MED ORDER — SODIUM CHLORIDE 0.45 % IV SOLN
INTRAVENOUS | Status: DC | PRN
  Administered 2015-09-30: 10 mL via INTRAVENOUS
  Administered 2015-09-30: 13:00:00 via INTRAVENOUS

## 2015-09-30 MED ORDER — ACETAMINOPHEN 500 MG PO TABS
1000.0000 mg | ORAL_TABLET | Freq: Four times a day (QID) | ORAL | Status: AC
  Administered 2015-10-02 – 2015-10-05 (×12): 1000 mg via ORAL
  Filled 2015-09-30 (×19): qty 2

## 2015-09-30 MED ORDER — LACTATED RINGERS IV SOLN
INTRAVENOUS | Status: DC | PRN
  Administered 2015-09-30 (×6): via INTRAVENOUS

## 2015-09-30 MED ORDER — STERILE WATER FOR INJECTION IJ SOLN
INTRAMUSCULAR | Status: AC
  Filled 2015-09-30: qty 10

## 2015-09-30 MED ORDER — NEOSTIGMINE METHYLSULFATE 10 MG/10ML IV SOLN
INTRAVENOUS | Status: AC
  Filled 2015-09-30: qty 2

## 2015-09-30 MED ORDER — OXYCODONE HCL ER 10 MG PO T12A
20.0000 mg | EXTENDED_RELEASE_TABLET | Freq: Two times a day (BID) | ORAL | Status: DC
  Administered 2015-10-01 – 2015-10-07 (×13): 20 mg via ORAL
  Filled 2015-09-30 (×13): qty 2

## 2015-09-30 MED ORDER — EPINEPHRINE HCL 0.1 MG/ML IJ SOSY
PREFILLED_SYRINGE | INTRAMUSCULAR | Status: AC
  Filled 2015-09-30: qty 10

## 2015-09-30 MED ORDER — HEPARIN SODIUM (PORCINE) 1000 UNIT/ML IJ SOLN
INTRAMUSCULAR | Status: AC
  Filled 2015-09-30: qty 1

## 2015-09-30 MED ORDER — FENTANYL CITRATE (PF) 250 MCG/5ML IJ SOLN
INTRAMUSCULAR | Status: AC
  Filled 2015-09-30: qty 5

## 2015-09-30 MED ORDER — OXYCODONE-ACETAMINOPHEN 7.5-325 MG PO TABS
1.0000 | ORAL_TABLET | Freq: Three times a day (TID) | ORAL | Status: DC | PRN
  Administered 2015-10-01 – 2015-10-06 (×6): 1 via ORAL
  Filled 2015-09-30 (×6): qty 1

## 2015-09-30 MED ORDER — DEXMEDETOMIDINE HCL IN NACL 200 MCG/50ML IV SOLN
0.0000 ug/kg/h | INTRAVENOUS | Status: DC
  Filled 2015-09-30: qty 50

## 2015-09-30 MED ORDER — SODIUM CHLORIDE 0.9 % IV SOLN: INTRAVENOUS | Status: DC

## 2015-09-30 MED ORDER — MIDAZOLAM HCL 2 MG/2ML IJ SOLN: 2.0000 mg | INTRAMUSCULAR | Status: DC | PRN

## 2015-09-30 MED ORDER — SIMVASTATIN 40 MG PO TABS
40.0000 mg | ORAL_TABLET | Freq: Every day | ORAL | Status: DC
  Administered 2015-10-01 – 2015-10-05 (×5): 40 mg via ORAL
  Filled 2015-09-30 (×8): qty 1

## 2015-09-30 MED ORDER — CHLORHEXIDINE GLUCONATE 0.12% ORAL RINSE (MEDLINE KIT)
15.0000 mL | Freq: Two times a day (BID) | OROMUCOSAL | Status: DC
  Administered 2015-09-30 – 2015-10-02 (×3): 15 mL via OROMUCOSAL

## 2015-09-30 MED ORDER — SUCCINYLCHOLINE CHLORIDE 20 MG/ML IJ SOLN
INTRAMUSCULAR | Status: AC
  Filled 2015-09-30: qty 1

## 2015-09-30 MED ORDER — ACETAMINOPHEN 650 MG RE SUPP
650.0000 mg | Freq: Once | RECTAL | Status: AC
  Administered 2015-09-30: 650 mg via RECTAL

## 2015-09-30 MED ORDER — ACETAMINOPHEN 160 MG/5ML PO SOLN
1000.0000 mg | Freq: Four times a day (QID) | ORAL | Status: AC
  Administered 2015-10-01 (×2): 1000 mg
  Filled 2015-09-30: qty 40.6

## 2015-09-30 MED ORDER — FERROUS SULFATE 325 (65 FE) MG PO TABS
325.0000 mg | ORAL_TABLET | Freq: Every day | ORAL | Status: DC
  Administered 2015-10-01 – 2015-10-07 (×7): 325 mg via ORAL
  Filled 2015-09-30 (×7): qty 1

## 2015-09-30 MED ORDER — HEMOSTATIC AGENTS (NO CHARGE) OPTIME
TOPICAL | Status: DC | PRN
  Administered 2015-09-30 (×6): 1 via TOPICAL

## 2015-09-30 MED ORDER — BISACODYL 5 MG PO TBEC
10.0000 mg | DELAYED_RELEASE_TABLET | Freq: Every day | ORAL | Status: DC
  Administered 2015-10-02 – 2015-10-07 (×3): 10 mg via ORAL
  Filled 2015-09-30 (×5): qty 2

## 2015-09-30 MED ORDER — DEXTROSE 50 % IV SOLN
INTRAVENOUS | Status: AC
  Administered 2015-09-30: 50 mL
  Filled 2015-09-30: qty 50

## 2015-09-30 MED ORDER — DEXTROSE 5 % IV SOLN
0.0000 ug/min | INTRAVENOUS | Status: DC
  Administered 2015-10-01: 25 ug/min via INTRAVENOUS
  Filled 2015-09-30 (×2): qty 2

## 2015-09-30 MED ORDER — CHLORHEXIDINE GLUCONATE 4 % EX LIQD: 30.0000 mL | CUTANEOUS | Status: DC

## 2015-09-30 MED ORDER — MORPHINE SULFATE (PF) 2 MG/ML IV SOLN
2.0000 mg | INTRAVENOUS | Status: DC | PRN
  Administered 2015-10-01 (×4): 2 mg via INTRAVENOUS
  Administered 2015-10-01 – 2015-10-02 (×2): 4 mg via INTRAVENOUS
  Administered 2015-10-02: 2 mg via INTRAVENOUS
  Filled 2015-09-30: qty 1
  Filled 2015-09-30: qty 2
  Filled 2015-09-30 (×2): qty 1
  Filled 2015-09-30 (×2): qty 2
  Filled 2015-09-30 (×2): qty 1

## 2015-09-30 MED ORDER — POTASSIUM CHLORIDE 10 MEQ/50ML IV SOLN
10.0000 meq | INTRAVENOUS | Status: AC
  Administered 2015-09-30 (×2): 10 meq via INTRAVENOUS

## 2015-09-30 MED ORDER — METOPROLOL TARTRATE 25 MG/10 ML ORAL SUSPENSION
12.5000 mg | Freq: Two times a day (BID) | ORAL | Status: DC
  Filled 2015-09-30 (×7): qty 5
  Filled 2015-09-30: qty 10

## 2015-09-30 MED ORDER — METOPROLOL TARTRATE 12.5 MG HALF TABLET
12.5000 mg | ORAL_TABLET | Freq: Two times a day (BID) | ORAL | Status: DC
  Administered 2015-10-01 – 2015-10-07 (×9): 12.5 mg via ORAL
  Filled 2015-09-30 (×15): qty 1

## 2015-09-30 MED ORDER — ALBUMIN HUMAN 5 % IV SOLN
250.0000 mL | INTRAVENOUS | Status: AC | PRN
  Administered 2015-09-30 (×3): 250 mL via INTRAVENOUS
  Filled 2015-09-30: qty 250

## 2015-09-30 MED ORDER — MIDAZOLAM HCL 5 MG/5ML IJ SOLN
INTRAMUSCULAR | Status: DC | PRN
Start: 2015-09-30 — End: 2015-09-30
  Administered 2015-09-30: 1 mg via INTRAVENOUS
  Administered 2015-09-30: 2 mg via INTRAVENOUS
  Administered 2015-09-30: 4 mg via INTRAVENOUS

## 2015-09-30 MED ORDER — LEVALBUTEROL HCL 1.25 MG/0.5ML IN NEBU
1.2500 mg | INHALATION_SOLUTION | Freq: Four times a day (QID) | RESPIRATORY_TRACT | Status: DC
  Administered 2015-10-01: 1.25 mg via RESPIRATORY_TRACT
  Filled 2015-09-30 (×8): qty 0.5

## 2015-09-30 MED ORDER — MIDAZOLAM HCL 10 MG/2ML IJ SOLN
INTRAMUSCULAR | Status: AC
  Filled 2015-09-30: qty 4

## 2015-09-30 MED ORDER — METOPROLOL TARTRATE 1 MG/ML IV SOLN: 2.5000 mg | INTRAVENOUS | Status: DC | PRN

## 2015-09-30 MED ORDER — VANCOMYCIN HCL IN DEXTROSE 1-5 GM/200ML-% IV SOLN
1000.0000 mg | Freq: Once | INTRAVENOUS | Status: AC
  Administered 2015-09-30: 1000 mg via INTRAVENOUS
  Filled 2015-09-30: qty 200

## 2015-09-30 MED ORDER — FENTANYL CITRATE (PF) 100 MCG/2ML IJ SOLN
INTRAMUSCULAR | Status: DC | PRN
  Administered 2015-09-30: 450 ug via INTRAVENOUS
  Administered 2015-09-30: 100 ug via INTRAVENOUS
  Administered 2015-09-30: 250 ug via INTRAVENOUS
  Administered 2015-09-30: 50 ug via INTRAVENOUS
  Administered 2015-09-30: 250 ug via INTRAVENOUS
  Administered 2015-09-30: 150 ug via INTRAVENOUS

## 2015-09-30 MED ORDER — GUAIFENESIN ER 600 MG PO TB12
600.0000 mg | ORAL_TABLET | Freq: Two times a day (BID) | ORAL | Status: DC
  Administered 2015-10-01 – 2015-10-07 (×12): 600 mg via ORAL
  Filled 2015-09-30 (×15): qty 1

## 2015-09-30 MED ORDER — BUDESONIDE-FORMOTEROL FUMARATE 160-4.5 MCG/ACT IN AERO
2.0000 | INHALATION_SPRAY | Freq: Two times a day (BID) | RESPIRATORY_TRACT | Status: DC
  Administered 2015-10-01 – 2015-10-07 (×13): 2 via RESPIRATORY_TRACT
  Filled 2015-09-30: qty 6

## 2015-09-30 MED ORDER — SODIUM CHLORIDE 0.9 % IJ SOLN
3.0000 mL | Freq: Two times a day (BID) | INTRAMUSCULAR | Status: DC
  Administered 2015-10-01 – 2015-10-05 (×7): 3 mL via INTRAVENOUS

## 2015-09-30 MED ORDER — PHENYLEPHRINE HCL 10 MG/ML IJ SOLN
10.0000 mg | INTRAVENOUS | Status: DC | PRN
  Administered 2015-09-30: 10 ug/min via INTRAVENOUS

## 2015-09-30 MED ORDER — ANTISEPTIC ORAL RINSE SOLUTION (CORINZ)
7.0000 mL | Freq: Four times a day (QID) | OROMUCOSAL | Status: DC
  Administered 2015-10-01 – 2015-10-02 (×5): 7 mL via OROMUCOSAL

## 2015-09-30 MED ORDER — CALCIUM CHLORIDE 10 % IV SOLN
INTRAVENOUS | Status: DC | PRN
  Administered 2015-09-30: 300 mg via INTRAVENOUS
  Administered 2015-09-30: 500 mg via INTRAVENOUS

## 2015-09-30 MED ORDER — LACTATED RINGERS IV SOLN
INTRAVENOUS | Status: DC
  Administered 2015-10-01: 10 mL/h via INTRAVENOUS

## 2015-09-30 MED ORDER — DOCUSATE SODIUM 100 MG PO CAPS
200.0000 mg | ORAL_CAPSULE | Freq: Every day | ORAL | Status: DC
  Administered 2015-10-02 – 2015-10-07 (×5): 200 mg via ORAL
  Filled 2015-09-30 (×8): qty 2

## 2015-09-30 MED ORDER — PANTOPRAZOLE SODIUM 40 MG PO TBEC: 40.0000 mg | DELAYED_RELEASE_TABLET | Freq: Every day | ORAL | Status: DC

## 2015-09-30 MED ORDER — NOREPINEPHRINE BITARTRATE 1 MG/ML IV SOLN
0.0000 ug/min | INTRAVENOUS | Status: DC
  Filled 2015-09-30: qty 4

## 2015-09-30 MED ORDER — ASPIRIN 81 MG PO CHEW
324.0000 mg | CHEWABLE_TABLET | Freq: Every day | ORAL | Status: DC
  Administered 2015-10-06: 324 mg
  Filled 2015-09-30 (×2): qty 4

## 2015-09-30 MED ORDER — CITALOPRAM HYDROBROMIDE 20 MG PO TABS
20.0000 mg | ORAL_TABLET | Freq: Every day | ORAL | Status: DC
  Administered 2015-10-01 – 2015-10-07 (×7): 20 mg via ORAL
  Filled 2015-09-30 (×7): qty 1

## 2015-09-30 MED ORDER — INSULIN ASPART 100 UNIT/ML ~~LOC~~ SOLN
0.0000 [IU] | SUBCUTANEOUS | Status: DC
  Administered 2015-09-30 – 2015-10-02 (×2): 2 [IU] via SUBCUTANEOUS

## 2015-09-30 MED ORDER — 0.9 % SODIUM CHLORIDE (POUR BTL) OPTIME
TOPICAL | Status: DC | PRN
  Administered 2015-09-30: 5000 mL
  Administered 2015-09-30: 1000 mL

## 2015-09-30 MED ORDER — BISACODYL 10 MG RE SUPP
10.0000 mg | Freq: Every day | RECTAL | Status: DC
  Filled 2015-09-30: qty 1

## 2015-09-30 MED ORDER — LACTATED RINGERS IV SOLN: INTRAVENOUS | Status: DC

## 2015-09-30 MED ORDER — PROTAMINE SULFATE 10 MG/ML IV SOLN
INTRAVENOUS | Status: DC | PRN
  Administered 2015-09-30: 50 mg via INTRAVENOUS
  Administered 2015-09-30: 10 mg via INTRAVENOUS
  Administered 2015-09-30: 50 mg via INTRAVENOUS
  Administered 2015-09-30: 40 mg via INTRAVENOUS
  Administered 2015-09-30 (×2): 15 mg via INTRAVENOUS

## 2015-09-30 MED ORDER — ETOMIDATE 2 MG/ML IV SOLN
INTRAVENOUS | Status: DC | PRN
  Administered 2015-09-30: 16 mg via INTRAVENOUS

## 2015-09-30 MED ORDER — VECURONIUM BROMIDE 10 MG IV SOLR
INTRAVENOUS | Status: AC
  Filled 2015-09-30: qty 10

## 2015-09-30 MED ORDER — TRAMADOL HCL 50 MG PO TABS
50.0000 mg | ORAL_TABLET | ORAL | Status: DC | PRN
  Administered 2015-10-03 – 2015-10-05 (×4): 50 mg via ORAL
  Administered 2015-10-06 – 2015-10-07 (×5): 100 mg via ORAL
  Filled 2015-09-30: qty 2
  Filled 2015-09-30: qty 1
  Filled 2015-09-30 (×3): qty 2
  Filled 2015-09-30 (×3): qty 1
  Filled 2015-09-30: qty 2

## 2015-09-30 MED ORDER — ETOMIDATE 2 MG/ML IV SOLN
INTRAVENOUS | Status: AC
  Filled 2015-09-30: qty 10

## 2015-09-30 MED ORDER — ONDANSETRON HCL 4 MG/2ML IJ SOLN
4.0000 mg | Freq: Four times a day (QID) | INTRAMUSCULAR | Status: DC | PRN
  Administered 2015-10-02: 4 mg via INTRAVENOUS
  Filled 2015-09-30: qty 2

## 2015-09-30 MED ORDER — POTASSIUM CHLORIDE 10 MEQ/50ML IV SOLN
10.0000 meq | Freq: Once | INTRAVENOUS | Status: AC
  Administered 2015-09-30: 10 meq via INTRAVENOUS

## 2015-09-30 MED ORDER — ARTIFICIAL TEARS OP OINT
TOPICAL_OINTMENT | OPHTHALMIC | Status: AC
  Filled 2015-09-30: qty 3.5

## 2015-09-30 MED ORDER — ALBUMIN HUMAN 5 % IV SOLN
INTRAVENOUS | Status: DC | PRN
  Administered 2015-09-30 (×2): via INTRAVENOUS

## 2015-09-30 MED ORDER — DEXTROSE 5 % IV SOLN
1.5000 g | Freq: Two times a day (BID) | INTRAVENOUS | Status: AC
  Administered 2015-09-30 – 2015-10-02 (×4): 1.5 g via INTRAVENOUS
  Filled 2015-09-30 (×4): qty 1.5

## 2015-09-30 MED ORDER — EPHEDRINE SULFATE 50 MG/ML IJ SOLN
INTRAMUSCULAR | Status: AC
  Filled 2015-09-30: qty 1

## 2015-09-30 MED ORDER — SODIUM CHLORIDE 0.9 % IJ SOLN
3.0000 mL | INTRAMUSCULAR | Status: DC | PRN
  Administered 2015-10-03: 3 mL via INTRAVENOUS
  Administered 2015-10-03: 11:00:00 via INTRAVENOUS
  Filled 2015-09-30 (×2): qty 3

## 2015-09-30 MED ORDER — POTASSIUM CHLORIDE 10 MEQ/50ML IV SOLN
10.0000 meq | INTRAVENOUS | Status: AC
  Administered 2015-09-30 (×3): 10 meq via INTRAVENOUS

## 2015-09-30 MED ORDER — INSULIN REGULAR BOLUS VIA INFUSION
0.0000 [IU] | Freq: Three times a day (TID) | INTRAVENOUS | Status: DC
  Filled 2015-09-30: qty 10

## 2015-09-30 MED ORDER — DARIFENACIN HYDROBROMIDE ER 7.5 MG PO TB24
7.5000 mg | ORAL_TABLET | Freq: Every day | ORAL | Status: DC
  Administered 2015-10-01 – 2015-10-07 (×7): 7.5 mg via ORAL
  Filled 2015-09-30 (×7): qty 1

## 2015-09-30 MED ORDER — FAMOTIDINE 20 MG PO TABS
20.0000 mg | ORAL_TABLET | Freq: Two times a day (BID) | ORAL | Status: DC
  Administered 2015-10-01 – 2015-10-07 (×13): 20 mg via ORAL
  Filled 2015-09-30 (×17): qty 1

## 2015-09-30 MED ORDER — ACETAMINOPHEN 160 MG/5ML PO SOLN: 650.0000 mg | Freq: Once | ORAL | Status: AC

## 2015-09-30 MED ORDER — ASPIRIN EC 325 MG PO TBEC
325.0000 mg | DELAYED_RELEASE_TABLET | Freq: Every day | ORAL | Status: DC
  Administered 2015-10-01 – 2015-10-07 (×6): 325 mg via ORAL
  Filled 2015-09-30 (×7): qty 1

## 2015-09-30 MED ORDER — SODIUM CHLORIDE 0.9 % IV SOLN
20.0000 ug | Freq: Once | INTRAVENOUS | Status: AC
  Administered 2015-09-30: 20 ug via INTRAVENOUS
  Filled 2015-09-30: qty 5

## 2015-09-30 MED ORDER — FAMOTIDINE IN NACL 20-0.9 MG/50ML-% IV SOLN
20.0000 mg | Freq: Two times a day (BID) | INTRAVENOUS | Status: AC
  Administered 2015-09-30 (×2): 20 mg via INTRAVENOUS
  Filled 2015-09-30: qty 50

## 2015-09-30 MED ORDER — ARTIFICIAL TEARS OP OINT
TOPICAL_OINTMENT | OPHTHALMIC | Status: DC | PRN
  Administered 2015-09-30: 1 via OPHTHALMIC

## 2015-09-30 MED ORDER — SODIUM CHLORIDE 0.9 % IJ SOLN
INTRAMUSCULAR | Status: AC
  Filled 2015-09-30: qty 40

## 2015-09-30 MED ORDER — MAGNESIUM SULFATE 4 GM/100ML IV SOLN
4.0000 g | Freq: Once | INTRAVENOUS | Status: AC
Start: 2015-09-30 — End: 2015-09-30
  Administered 2015-09-30: 4 g via INTRAVENOUS
  Filled 2015-09-30: qty 100

## 2015-09-30 MED ORDER — HEPARIN SODIUM (PORCINE) 1000 UNIT/ML IJ SOLN
INTRAMUSCULAR | Status: DC | PRN
  Administered 2015-09-30: 16000 [IU] via INTRAVENOUS

## 2015-09-30 MED ORDER — SODIUM CHLORIDE 0.9 % IV SOLN: 250.0000 mL | INTRAVENOUS | Status: DC

## 2015-09-30 MED ORDER — ROCURONIUM BROMIDE 50 MG/5ML IV SOLN
INTRAVENOUS | Status: AC
  Filled 2015-09-30: qty 2

## 2015-09-30 MED ORDER — LACTATED RINGERS IV SOLN: 500.0000 mL | Freq: Once | INTRAVENOUS | Status: DC | PRN

## 2015-09-30 MED ORDER — SODIUM CHLORIDE 0.9 % IV SOLN
INTRAVENOUS | Status: DC
  Filled 2015-09-30: qty 2.5

## 2015-09-30 MED ORDER — PROPOFOL 10 MG/ML IV BOLUS
INTRAVENOUS | Status: AC
  Filled 2015-09-30: qty 20

## 2015-09-30 MED ORDER — NITROGLYCERIN IN D5W 200-5 MCG/ML-% IV SOLN: 0.0000 ug/min | INTRAVENOUS | Status: DC

## 2015-09-30 MED ORDER — PANTOPRAZOLE SODIUM 40 MG PO TBEC
40.0000 mg | DELAYED_RELEASE_TABLET | Freq: Every day | ORAL | Status: DC
  Administered 2015-10-02 – 2015-10-07 (×6): 40 mg via ORAL
  Filled 2015-09-30 (×7): qty 1

## 2015-09-30 MED ORDER — ROCURONIUM BROMIDE 100 MG/10ML IV SOLN
INTRAVENOUS | Status: DC | PRN
  Administered 2015-09-30 (×4): 50 mg via INTRAVENOUS

## 2015-09-30 MED ORDER — ONDANSETRON HCL 4 MG/2ML IJ SOLN
INTRAMUSCULAR | Status: AC
  Filled 2015-09-30: qty 2

## 2015-09-30 MED ORDER — VECURONIUM BROMIDE 10 MG IV SOLR
INTRAVENOUS | Status: DC | PRN
  Administered 2015-09-30 (×2): 5 mg via INTRAVENOUS

## 2015-09-30 MED ORDER — PROTAMINE SULFATE 10 MG/ML IV SOLN
INTRAVENOUS | Status: AC
  Filled 2015-09-30: qty 5

## 2015-09-30 SURGICAL SUPPLY — 96 items
ADAPTER CARDIO PERF ANTE/RETRO (ADAPTER) ×4 IMPLANT
BAG DECANTER FOR FLEXI CONT (MISCELLANEOUS) ×4 IMPLANT
BENZOIN TINCTURE PRP APPL 2/3 (GAUZE/BANDAGES/DRESSINGS) ×4 IMPLANT
BLADE STERNUM SYSTEM 6 (BLADE) ×4 IMPLANT
BLADE SURG 12 STRL SS (BLADE) ×8 IMPLANT
BLADE SURG 15 STRL LF DISP TIS (BLADE) ×4 IMPLANT
BLADE SURG 15 STRL SS (BLADE) ×4
CANISTER SUCTION 2500CC (MISCELLANEOUS) ×4 IMPLANT
CANNULA GUNDRY RCSP 15FR (MISCELLANEOUS) ×4 IMPLANT
CATH CPB KIT VANTRIGT (MISCELLANEOUS) ×4 IMPLANT
CATH HEART VENT LEFT (CATHETERS) ×2 IMPLANT
CATH RETROPLEGIA CORONARY 14FR (CATHETERS) IMPLANT
CATH ROBINSON RED A/P 18FR (CATHETERS) ×12 IMPLANT
CATH THORACIC 36FR RT ANG (CATHETERS) ×4 IMPLANT
CATH/SQUID NICHOLS JEHLE COR (CATHETERS) ×4 IMPLANT
CLIP FOGARTY SPRING 6M (CLIP) IMPLANT
COVER SURGICAL LIGHT HANDLE (MISCELLANEOUS) ×8 IMPLANT
CRADLE DONUT ADULT HEAD (MISCELLANEOUS) ×4 IMPLANT
DRAIN CHANNEL 32F RND 10.7 FF (WOUND CARE) ×4 IMPLANT
DRAPE CARDIOVASCULAR INCISE (DRAPES) ×2
DRAPE SLUSH/WARMER DISC (DRAPES) ×4 IMPLANT
DRAPE SRG 135X102X78XABS (DRAPES) ×2 IMPLANT
DRSG AQUACEL AG ADV 3.5X14 (GAUZE/BANDAGES/DRESSINGS) ×4 IMPLANT
ELECT BLADE 4.0 EZ CLEAN MEGAD (MISCELLANEOUS) ×4
ELECT BLADE 6.5 EXT (BLADE) ×4 IMPLANT
ELECT CAUTERY BLADE 6.4 (BLADE) ×4 IMPLANT
ELECT REM PT RETURN 9FT ADLT (ELECTROSURGICAL) ×8
ELECTRODE BLDE 4.0 EZ CLN MEGD (MISCELLANEOUS) ×2 IMPLANT
ELECTRODE REM PT RTRN 9FT ADLT (ELECTROSURGICAL) ×4 IMPLANT
GAUZE SPONGE 4X4 12PLY STRL (GAUZE/BANDAGES/DRESSINGS) ×8 IMPLANT
GLOVE BIO SURGEON STRL SZ 6.5 (GLOVE) ×3 IMPLANT
GLOVE BIO SURGEON STRL SZ7 (GLOVE) ×16 IMPLANT
GLOVE BIO SURGEON STRL SZ7.5 (GLOVE) ×12 IMPLANT
GLOVE BIO SURGEONS STRL SZ 6.5 (GLOVE) ×1
GLOVE BIOGEL PI IND STRL 6 (GLOVE) ×4 IMPLANT
GLOVE BIOGEL PI IND STRL 6.5 (GLOVE) ×2 IMPLANT
GLOVE BIOGEL PI IND STRL 8 (GLOVE) ×2 IMPLANT
GLOVE BIOGEL PI INDICATOR 6 (GLOVE) ×4
GLOVE BIOGEL PI INDICATOR 6.5 (GLOVE) ×2
GLOVE BIOGEL PI INDICATOR 8 (GLOVE) ×2
GOWN STRL REUS W/ TWL LRG LVL3 (GOWN DISPOSABLE) ×20 IMPLANT
GOWN STRL REUS W/TWL LRG LVL3 (GOWN DISPOSABLE) ×20
HEMOSTAT POWDER SURGIFOAM 1G (HEMOSTASIS) ×12 IMPLANT
HEMOSTAT SURGICEL 2X14 (HEMOSTASIS) ×4 IMPLANT
INSERT FOGARTY XLG (MISCELLANEOUS) IMPLANT
KIT BASIN OR (CUSTOM PROCEDURE TRAY) ×4 IMPLANT
KIT ROOM TURNOVER OR (KITS) ×4 IMPLANT
KIT SUCTION CATH 14FR (SUCTIONS) ×4 IMPLANT
LEAD PACING MYOCARDI (MISCELLANEOUS) ×8 IMPLANT
LIQUID BAND (GAUZE/BANDAGES/DRESSINGS) ×4 IMPLANT
NDL SUT 1 .5 CRC FRENCH EYE (NEEDLE) ×2 IMPLANT
NEEDLE FRENCH EYE (NEEDLE) ×2
NS IRRIG 1000ML POUR BTL (IV SOLUTION) ×24 IMPLANT
PACK OPEN HEART (CUSTOM PROCEDURE TRAY) ×4 IMPLANT
PAD ARMBOARD 7.5X6 YLW CONV (MISCELLANEOUS) ×8 IMPLANT
SET CARDIOPLEGIA MPS 5001102 (MISCELLANEOUS) ×4 IMPLANT
SPONGE GAUZE 4X4 12PLY STER LF (GAUZE/BANDAGES/DRESSINGS) ×4 IMPLANT
SURGIFLO W/THROMBIN 8M KIT (HEMOSTASIS) ×16 IMPLANT
SUT BONE WAX W31G (SUTURE) ×8 IMPLANT
SUT ETHIBON 2 0 V 52N 30 (SUTURE) ×8 IMPLANT
SUT ETHIBON EXCEL 2-0 V-5 (SUTURE) ×12 IMPLANT
SUT ETHIBOND 2 0 SH (SUTURE) ×8
SUT ETHIBOND 2 0 SH 36X2 (SUTURE) ×8 IMPLANT
SUT ETHIBOND V-5 VALVE (SUTURE) ×12 IMPLANT
SUT ETHILON 3 0 FSL (SUTURE) ×4 IMPLANT
SUT PROLENE 3 0 RB 1 (SUTURE) ×4 IMPLANT
SUT PROLENE 3 0 SH 1 (SUTURE) IMPLANT
SUT PROLENE 3 0 SH DA (SUTURE) IMPLANT
SUT PROLENE 3 0 SH1 36 (SUTURE) ×4 IMPLANT
SUT PROLENE 4 0 RB 1 (SUTURE) ×16
SUT PROLENE 4 0 SH DA (SUTURE) ×12 IMPLANT
SUT PROLENE 4-0 RB1 .5 CRCL 36 (SUTURE) ×16 IMPLANT
SUT PROLENE 6 0 C 1 30 (SUTURE) ×12 IMPLANT
SUT PROLENE 6 0 CC (SUTURE) ×12 IMPLANT
SUT SILK  1 MH (SUTURE) ×6
SUT SILK 1 MH (SUTURE) ×6 IMPLANT
SUT SILK 1 TIES 10X30 (SUTURE) ×4 IMPLANT
SUT SILK 2 0 SH CR/8 (SUTURE) ×8 IMPLANT
SUT SILK 3 0 SH CR/8 (SUTURE) ×4 IMPLANT
SUT SILK 4 0 TIE 10X30 (SUTURE) ×8 IMPLANT
SUT STEEL 6MS V (SUTURE) ×8 IMPLANT
SUT TEM PAC WIRE 2 0 SH (SUTURE) ×16 IMPLANT
SUT VIC AB 1 CTX 36 (SUTURE) ×12
SUT VIC AB 1 CTX36XBRD ANBCTR (SUTURE) ×12 IMPLANT
SUT VIC AB 2-0 CTX 27 (SUTURE) ×8 IMPLANT
SUT VIC AB 3-0 X1 27 (SUTURE) ×8 IMPLANT
SYR 10ML KIT SKIN ADHESIVE (MISCELLANEOUS) IMPLANT
SYSTEM SAHARA CHEST DRAIN ATS (WOUND CARE) ×4 IMPLANT
TAPE CLOTH SURG 4X10 WHT LF (GAUZE/BANDAGES/DRESSINGS) ×4 IMPLANT
TOWEL OR 17X24 6PK STRL BLUE (TOWEL DISPOSABLE) ×4 IMPLANT
TOWEL OR 17X26 10 PK STRL BLUE (TOWEL DISPOSABLE) ×4 IMPLANT
TRAY FOLEY IC TEMP SENS 16FR (CATHETERS) ×4 IMPLANT
UNDERPAD 30X30 INCONTINENT (UNDERPADS AND DIAPERS) ×4 IMPLANT
VALVE MAGNA EASE 21MM (Prosthesis & Implant Heart) ×4 IMPLANT
VENT LEFT HEART 12002 (CATHETERS) ×4
WATER STERILE IRR 1000ML POUR (IV SOLUTION) ×8 IMPLANT

## 2015-09-30 NOTE — Progress Notes (Signed)
Notified Dr. Ermalene Postin regarding Goody's HA Powder. Dr. Ermalene Postin advised that we should be ok to proceed.

## 2015-09-30 NOTE — Transfer of Care (Signed)
Immediate Anesthesia Transfer of Care Note  Patient: Stephanie Velasquez  Procedure(s) Performed: Procedure(s): AORTIC VALVE REPLACEMENT (AVR) (N/A) TRANSESOPHAGEAL ECHOCARDIOGRAM (TEE) (N/A)  Patient Location: SICU  Anesthesia Type:General  Level of Consciousness: sedated and unresponsive  Airway & Oxygen Therapy: Patient re-intubated, Patient remains intubated per anesthesia plan and Patient placed on Ventilator (see vital sign flow sheet for setting)  Post-op Assessment: Report given to RN and Post -op Vital signs reviewed and stable  Post vital signs: Reviewed and stable  Last Vitals:  Filed Vitals:   09/30/15 1250  BP: 95/64  Pulse: 87  Temp:   Resp: 10    Complications: No apparent anesthesia complications

## 2015-09-30 NOTE — Progress Notes (Signed)
Vent changed per MD

## 2015-09-30 NOTE — Brief Op Note (Signed)
09/30/2015  10:52 AM  PATIENT:  Stephanie Velasquez  59 y.o. female  PRE-OPERATIVE DIAGNOSIS:  AS  POST-OPERATIVE DIAGNOSIS:  AS  PROCEDURE:  TRANSESOPHAGEAL ECHOCARDIOGRAM (TEE), MEDIAN STERNOTOMY for  AORTIC VALVE REPLACEMENT (AVR)   SURGEON:  Surgeon(s) and Role:    * Ivin Poot, MD - Primary  PHYSICIAN ASSISTANT: Lars Pinks PA-C  ASSISTANTS: Miki Kins RNFA  ANESTHESIA:   general  EBL:  Total I/O In: 800 [I.V.:800] Out: 100 [Urine:100]  DRAINS: Chest tubes placed in the mediastinal and pleural chest tubes   SPECIMEN:  Source of Specimen:  Native AV leaflets  DISPOSITION OF SPECIMEN:  PATHOLOGY  COUNTS CORRECT:  YES  DICTATION: .Dragon Dictation  PLAN OF CARE: Admit to inpatient   PATIENT DISPOSITION:  ICU - intubated and hemodynamically stable.   Delay start of Pharmacological VTE agent (>24hrs) due to surgical blood loss or risk of bleeding: yes  BASELINE WEIGHT: 44 kg  Aortic Valve Etiology   Aortic Insufficiency:  Trivial/Trace  Aortic Valve Disease:  Yes.  Aortic Stenosis:  Yes. Smallest Aortic Valve Area: 0.66cm2; Highest Mean Gradient: 73mmHg.  Etiology (Choose at least one and up to  5 etiologies):  Bicuspid valve disease  Aortic Valve  Procedure Performed:  Replacement: Yes.  Bioprosthetic Valve. Implant Model Number:[id number] [id number], Size:21 mm, Unique Device Identifier:[id number]  Repair/Reconstruction: No.   Aortic Annular Enlargement: No.

## 2015-09-30 NOTE — Progress Notes (Signed)
Pt placed on SIMV RR 4 FIO2 40% per wean protocol.  Tolerating well at this time.  RT will continue to monitor.

## 2015-09-30 NOTE — Progress Notes (Signed)
The patient was examined and preop studies reviewed. There has been no change from the prior exam and the patient is ready for surgery.  plan AVR on C Soderberg today

## 2015-09-30 NOTE — Anesthesia Preprocedure Evaluation (Addendum)
Anesthesia Evaluation  Patient identified by MRN, date of birth, ID band Patient awake    Reviewed: Allergy & Precautions, NPO status , Patient's Chart, lab work & pertinent test results  History of Anesthesia Complications (+) PONV and history of anesthetic complications  Airway Mallampati: II  TM Distance: >3 FB Neck ROM: Full    Dental  (+) Dental Advisory Given, Partial Lower, Partial Upper   Pulmonary COPD, Current Smoker,    Pulmonary exam normal breath sounds clear to auscultation       Cardiovascular hypertension, Pt. on medications (-) angina+ Peripheral Vascular Disease  (-) CAD and (-) Past MI Normal cardiovascular exam+ Valvular Problems/Murmurs AS  Rhythm:Regular Rate:Normal + Systolic murmurs    Neuro/Psych  Headaches, PSYCHIATRIC DISORDERS Anxiety Depression    GI/Hepatic Neg liver ROS, GERD  Medicated,  Endo/Other  negative endocrine ROS  Renal/GU negative Renal ROS     Musculoskeletal  (+) Arthritis ,   Abdominal   Peds  Hematology  (+) Blood dyscrasia, anemia ,   Anesthesia Other Findings Day of surgery medications reviewed with the patient.  Reproductive/Obstetrics                           Anesthesia Physical Anesthesia Plan  ASA: IV  Anesthesia Plan: General   Post-op Pain Management:    Induction: Intravenous  Airway Management Planned: Oral ETT  Additional Equipment: Arterial line, CVP, PA Cath, TEE and Ultrasound Guidance Line Placement  Intra-op Plan:   Post-operative Plan: Post-operative intubation/ventilation  Informed Consent: I have reviewed the patients History and Physical, chart, labs and discussed the procedure including the risks, benefits and alternatives for the proposed anesthesia with the patient or authorized representative who has indicated his/her understanding and acceptance.   Dental advisory given  Plan Discussed with:  CRNA  Anesthesia Plan Comments: (Risks/benefits of general anesthesia discussed with patient including risk of damage to teeth, lips, gum, and tongue, nausea/vomiting, allergic reactions to medications, and the possibility of heart attack, stroke and death.  All patient questions answered.  Patient wishes to proceed.)       Anesthesia Quick Evaluation

## 2015-09-30 NOTE — Anesthesia Postprocedure Evaluation (Signed)
  Anesthesia Post-op Note  Patient: Stephanie Velasquez  Procedure(s) Performed: Procedure(s): AORTIC VALVE REPLACEMENT (AVR) (N/A) TRANSESOPHAGEAL ECHOCARDIOGRAM (TEE) (N/A)  Patient Location: ICU  Anesthesia Type:General  Level of Consciousness: sedated and Patient remains intubated per anesthesia plan  Airway and Oxygen Therapy: Patient remains intubated per anesthesia plan  Post-op Pain: none  Post-op Assessment: Post-op Vital signs reviewed, Patient's Cardiovascular Status Stable and Respiratory Function Stable  Patient transported to ICU from OR intubated, sedated, on minimal vasopressors.  Vital signs stable.  Report given to ICU team.            Post-op Vital Signs: Reviewed and stable  Last Vitals:  Filed Vitals:   09/30/15 1330  BP: 96/64  Pulse: 88  Temp: 36.2 C  Resp: 16    Complications: No apparent anesthesia complications

## 2015-09-30 NOTE — Op Note (Signed)
Stephanie, Velasquez NO.:  [id number]  MEDICAL RECORD NO.:  [id number]  LOCATION:  2S10C                        FACILITY:  [facility]  PHYSICIAN:  Ivin Poot, M.D.  DATE OF BIRTH:  1956-02-12  DATE OF PROCEDURE:  09/30/2015 DATE OF DISCHARGE:                              OPERATIVE REPORT   OPERATION:  Aortic valve replacement using a 23 mm Edwards Pericardial Magna Ease tissue valve.  SURGEON:  Ivin Poot, M.D.  ASSISTANT:  Lars Pinks, PA-C.  ANESTHESIA:  General by Dr. Gifford Shave.  PREOPERATIVE DIAGNOSIS:  Severe aortic stenosis.  POSTOPERATIVE DIAGNOSIS:  Severe aortic stenosis.  CLINICAL NOTE:  The patient is a 59 year old Caucasian female smoker with progressive decreasing exercise tolerance, weight loss, and new onset of pedal edema.  She has had a cardiac murmur since age 31. Recent echocardiogram showed severe increase in transvalvular gradient of close to 90 mmHg.  Cardiac catheterization demonstrated normal coronaries with normal right-sided right heart pressures.  The patient was felt to be a candidate for aortic valve replacement.  I discussed the procedure in detail with the patient and her family in the office including the indications, benefits, alternatives, and risks.  She had a history of GI bleeding from peptic ulcer disease and a tissue valve was recommended.  She understood these issues and agreed to proceed with surgery after demonstrating her understanding of the risks of bleeding, blood transfusion, stroke, postoperative heart block, postoperative pulmonary problems including pleural effusion, infection, and death.  PROCEDURE IN DETAIL:  The patient was brought to the operating room and placed supine on the table where general anesthesia was induced under invasive hemodynamic monitoring.  A transesophageal echo probe was placed by the anesthesiologist which confirmed the preoperative diagnosis.  The patient was prepped and  draped as a sterile field.  A proper time-out was performed.  A sternal incision was made.  A sternal retractor was placed, and the pericardium was opened.  The aorta was inspected and palpated.  It was normal size.  It had no significant calcification but was somewhat thin.  Heparin was administered. Pursestrings were placed in the ascending aorta and right atrium, and the patient was cannulated and placed on cardiopulmonary bypass after the ACT was documented as being therapeutic.  Cardioplegic cannula was placed with antegrade and retrograde cold blood cardioplegia and an LV vent was placed via the right superior pulmonary vein.  The patient was cooled to 32 degrees, and the aortic crossclamp was applied.  One liter of cold blood cardioplegia was delivered which resulted in an excellent cardioplegic arrest with septal temperature dropping less than 14 degrees.  Cardioplegia was delivered every 20 minutes.  A transverse aortotomy was performed.  The aortic valve was inspected. It was heavily calcified, thickened, stenotic, and bicuspid.  It was excised and the annulus was debrided of calcium.  The outflow tract was irrigated with copious amounts of cold saline.  The annulus was sized to a 21 mm pericardial [employer] valve.  Subannular 2-0 Ethibond sutures were placed around the anulus, mentioning 14 total.  The valve was prepared according to protocol.  The valve was seated and sutures were tied.  The valve was inspected  and found to conform the anulus well without obstruction of the coronary ostia and without evidence of a space for perivalvular leak.  The aortotomy was closed in 2 layers using running 4-0 Prolene and air was vented from the coronaries with a dose of retrograde warm blood cardioplegia and the usual de-airing maneuvers. The crossclamp was removed.  The heart was cardioverted back to a regular rhythm.  The LV vent and cardioplegic cannulas were removed.  The  aortotomy was hemostatic.  The patient was re-warmed and re-perfused.  Temporary pacing wires were applied.  The lungs re-expanded, and the ventilator was resumed.  The patient was then weaned off cardiopulmonary bypass without inotropes. Hemodynamics and cardiac index were normal.  Echo showed good functioning new prosthetic valve without AI.  Protamine was administered without adverse reaction.  The cannulas were removed.  The mediastinum was irrigated.  There was still diffuse oozing from the mediastinum after reversal of heparin, and the patient was given a dose of desmopressin.  Anterior and posterior mediastinal chest tubes were placed and brought out through separate incisions.  The pericardium was closed.  The sternum was closed with interrupted wire.  The pectoralis fascia and subcutaneous layers were closed in running Vicryl.  The skin was closed with a subcuticular and sterile dressings were applied. Total cardiopulmonary bypass time was 130 minutes.     Ivin Poot, M.D.     PV/MEDQ  D:  09/30/2015  T:  09/30/2015  Job:  [id number]

## 2015-09-30 NOTE — Progress Notes (Signed)
  Echocardiogram Echocardiogram Transesophageal has been performed.  Stephanie Velasquez 09/30/2015, 9:32 AM

## 2015-09-30 NOTE — Anesthesia Procedure Notes (Signed)
Procedure Name: Intubation Date/Time: 09/30/2015 7:58 AM Performed by: Mariea Clonts Pre-anesthesia Checklist: Patient identified, Timeout performed, Emergency Drugs available, Suction available and Patient being monitored Patient Re-evaluated:Patient Re-evaluated prior to inductionOxygen Delivery Method: Circle system utilized Preoxygenation: Pre-oxygenation with 100% oxygen Intubation Type: IV induction Ventilation: Mask ventilation without difficulty Laryngoscope Size: Mac and 4 Grade View: Grade I Tube type: Oral Tube size: 8.0 mm Number of attempts: 1 Placement Confirmation: ETT inserted through vocal cords under direct vision,  breath sounds checked- equal and bilateral and positive ETCO2 Secured at: 21 cm Tube secured with: Tape Dental Injury: Teeth and Oropharynx as per pre-operative assessment

## 2015-09-30 NOTE — OR Nursing (Signed)
RN contacted Tamela Oddi (2S), providing a patient update.

## 2015-09-30 NOTE — Progress Notes (Signed)
CT surgery p.m. Rounds  Patient had aVR today for severe AS Patient has severe COPD and has not yet been extubated because of hypercarbia Hemodynamics stable on low-dose neo-, a paced rhythm Neuro intact Patient receiving bronchodilator therapy Continue with routine postop care

## 2015-10-01 ENCOUNTER — Inpatient Hospital Stay (HOSPITAL_COMMUNITY): Payer: Medicare Other

## 2015-10-01 ENCOUNTER — Encounter (HOSPITAL_COMMUNITY): Payer: Self-pay | Admitting: Cardiothoracic Surgery

## 2015-10-01 LAB — GLUCOSE, CAPILLARY
Glucose-Capillary: 91 mg/dL (ref 65–99)
Glucose-Capillary: 90 mg/dL (ref 65–99)
Glucose-Capillary: 100 mg/dL — ABNORMAL HIGH (ref 65–99)
Glucose-Capillary: 95 mg/dL (ref 65–99)
Glucose-Capillary: 129 mg/dL — ABNORMAL HIGH (ref 65–99)

## 2015-10-01 LAB — CBC
HCT: 27.4 % — ABNORMAL LOW (ref 36.0–46.0)
HEMATOCRIT: 27.6 % — AB (ref 36.0–46.0)
Hemoglobin: 8.8 g/dL — ABNORMAL LOW (ref 12.0–15.0)
Hemoglobin: 8.8 g/dL — ABNORMAL LOW (ref 12.0–15.0)
MCH: 28.8 pg (ref 26.0–34.0)
MCH: 28.6 pg (ref 26.0–34.0)
MCHC: 32.1 g/dL (ref 30.0–36.0)
MCHC: 31.9 g/dL (ref 30.0–36.0)
MCV: 89.5 fL (ref 78.0–100.0)
MCV: 89.6 fL (ref 78.0–100.0)
PLATELETS: 150 10*3/uL (ref 150–400)
PLATELETS: 122 10*3/uL — AB (ref 150–400)
RBC: 3.06 MIL/uL — AB (ref 3.87–5.11)
RBC: 3.08 MIL/uL — AB (ref 3.87–5.11)
RDW: 21.3 % — AB (ref 11.5–15.5)
RDW: 21.2 % — ABNORMAL HIGH (ref 11.5–15.5)
WBC: 12.1 10*3/uL — AB (ref 4.0–10.5)
WBC: 9 10*3/uL (ref 4.0–10.5)

## 2015-10-01 LAB — POCT I-STAT 3, ART BLOOD GAS (G3+)
Acid-base deficit: 2 mmol/L (ref 0.0–2.0)
Acid-base deficit: 3 mmol/L — ABNORMAL HIGH (ref 0.0–2.0)
Bicarbonate: 23.3 mEq/L (ref 20.0–24.0)
Bicarbonate: 24 mEq/L (ref 20.0–24.0)
O2 Saturation: 85 %
O2 Saturation: 98 %
Patient temperature: 37.4
Patient temperature: 37.4
TCO2: 25 mmol/L (ref 0–100)
TCO2: 25 mmol/L (ref 0–100)
pCO2 arterial: 45.6 mmHg — ABNORMAL HIGH (ref 35.0–45.0)
pCO2 arterial: 46.7 mmHg — ABNORMAL HIGH (ref 35.0–45.0)
pH, Arterial: 7.318 — ABNORMAL LOW (ref 7.350–7.450)
pH, Arterial: 7.32 — ABNORMAL LOW (ref 7.350–7.450)
pO2, Arterial: 110 mmHg — ABNORMAL HIGH (ref 80.0–100.0)
pO2, Arterial: 56 mmHg — ABNORMAL LOW (ref 80.0–100.0)

## 2015-10-01 LAB — BASIC METABOLIC PANEL
ANION GAP: 9 (ref 5–15)
BUN: 9 mg/dL (ref 6–20)
CALCIUM: 8.1 mg/dL — AB (ref 8.9–10.3)
CO2: 24 mmol/L (ref 22–32)
Chloride: 102 mmol/L (ref 101–111)
Creatinine, Ser: 0.45 mg/dL (ref 0.44–1.00)
GFR calc Af Amer: >60 mL/min (ref >60)
Glucose, Bld: 111 mg/dL — ABNORMAL HIGH (ref 65–99)
POTASSIUM: 4 mmol/L (ref 3.5–5.1)
SODIUM: 135 mmol/L (ref 135–145)

## 2015-10-01 LAB — CREATININE, SERUM
Creatinine, Ser: 0.42 mg/dL — ABNORMAL LOW (ref 0.44–1.00)
GFR calc non Af Amer: >60 mL/min (ref >60)

## 2015-10-01 LAB — POCT I-STAT, CHEM 8
BUN: 12 mg/dL (ref 6–20)
Calcium, Ion: 1.25 mmol/L (ref 1.13–1.30)
Chloride: 95 mmol/L — ABNORMAL LOW (ref 101–111)
Creatinine, Ser: 0.5 mg/dL (ref 0.44–1.00)
Glucose, Bld: 164 mg/dL — ABNORMAL HIGH (ref 65–99)
HCT: 29 % — ABNORMAL LOW (ref 36.0–46.0)
Hemoglobin: 9.9 g/dL — ABNORMAL LOW (ref 12.0–15.0)
Potassium: 3.8 mmol/L (ref 3.5–5.1)
Sodium: 134 mmol/L — ABNORMAL LOW (ref 135–145)
TCO2: 24 mmol/L (ref 0–100)

## 2015-10-01 LAB — MAGNESIUM
MAGNESIUM: 1.6 mg/dL — AB (ref 1.7–2.4)
Magnesium: 1.6 mg/dL — ABNORMAL LOW (ref 1.7–2.4)

## 2015-10-01 MED ORDER — MIDAZOLAM HCL 2 MG/2ML IJ SOLN: 1.0000 mg | INTRAMUSCULAR | Status: DC | PRN

## 2015-10-01 MED ORDER — INSULIN ASPART 100 UNIT/ML ~~LOC~~ SOLN
0.0000 [IU] | SUBCUTANEOUS | Status: DC
  Administered 2015-10-01 – 2015-10-02 (×2): 2 [IU] via SUBCUTANEOUS

## 2015-10-01 MED ORDER — ENSURE ENLIVE PO LIQD
237.0000 mL | Freq: Two times a day (BID) | ORAL | Status: DC
  Administered 2015-10-01 – 2015-10-07 (×10): 237 mL via ORAL

## 2015-10-01 MED ORDER — LEVALBUTEROL HCL 1.25 MG/0.5ML IN NEBU
1.2500 mg | INHALATION_SOLUTION | Freq: Four times a day (QID) | RESPIRATORY_TRACT | Status: DC
  Administered 2015-10-01 – 2015-10-03 (×8): 1.25 mg via RESPIRATORY_TRACT
  Filled 2015-10-01 (×14): qty 0.5

## 2015-10-01 MED ORDER — FUROSEMIDE 10 MG/ML IJ SOLN
10.0000 mg | Freq: Two times a day (BID) | INTRAMUSCULAR | Status: DC
  Administered 2015-10-01 – 2015-10-03 (×5): 10 mg via INTRAVENOUS
  Filled 2015-10-01: qty 2
  Filled 2015-10-01: qty 1
  Filled 2015-10-01: qty 2
  Filled 2015-10-01: qty 1
  Filled 2015-10-01 (×2): qty 2
  Filled 2015-10-01: qty 1
  Filled 2015-10-01: qty 2
  Filled 2015-10-01: qty 1

## 2015-10-01 MED FILL — Mannitol IV Soln 20%: INTRAVENOUS | Qty: 500 | Status: AC

## 2015-10-01 MED FILL — Heparin Sodium (Porcine) Inj 1000 Unit/ML: INTRAMUSCULAR | Qty: 30 | Status: AC

## 2015-10-01 MED FILL — Potassium Chloride Inj 2 mEq/ML: INTRAVENOUS | Qty: 40 | Status: AC

## 2015-10-01 MED FILL — Sodium Bicarbonate IV Soln 8.4%: INTRAVENOUS | Qty: 50 | Status: AC

## 2015-10-01 MED FILL — Lidocaine HCl IV Inj 20 MG/ML: INTRAVENOUS | Qty: 5 | Status: AC

## 2015-10-01 MED FILL — Magnesium Sulfate Inj 50%: INTRAMUSCULAR | Qty: 10 | Status: AC

## 2015-10-01 MED FILL — Electrolyte-R (PH 7.4) Solution: INTRAVENOUS | Qty: 3000 | Status: AC

## 2015-10-01 MED FILL — Heparin Sodium (Porcine) Inj 1000 Unit/ML: INTRAMUSCULAR | Qty: 10 | Status: AC

## 2015-10-01 MED FILL — Sodium Chloride IV Soln 0.9%: INTRAVENOUS | Qty: 3000 | Status: AC

## 2015-10-01 NOTE — Procedures (Signed)
Extubation Procedure Note  Patient Details:   Name: Stephanie Velasquez DOB: Jan 31, 1956 MRN: [id number]   Airway Documentation:  Airway 8 mm (Active)  Secured at (cm) 21 cm 09/30/2015  8:13 PM  Measured From Lips 09/30/2015  8:13 PM  Secured Location Right 09/30/2015  8:13 PM  Secured By Pink Tape 09/30/2015  8:13 PM  Site Condition Dry 09/30/2015  8:13 PM    Evaluation  O2 sats: stable throughout Complications: No apparent complications Patient did tolerate procedure well. Bilateral Breath Sounds: Clear, Diminished Suctioning: Airway Yes   Patient extubated to 4L [place] with O2 sat of 99%. Patient performed 610ml on IS.  [name] 10/01/2015, 12:41 AM

## 2015-10-01 NOTE — Progress Notes (Signed)
10/01/2015 1500    Nursing note Dr. Prescott Gum at bedside with verbal orders to AAI pace at rate 88. Orders enacted. Pt. Tolerated well. VSS. Will continue to closely monitor patient. Armando Bukhari, Arville Lime

## 2015-10-01 NOTE — Progress Notes (Signed)
1 Day Post-Op Procedure(s) (LRB): AORTIC VALVE REPLACEMENT (AVR) (N/A) TRANSESOPHAGEAL ECHOCARDIOGRAM (TEE) (N/A) Subjective:  C/o back pain nsr CXR clear  Objective: Vital signs in last 24 hours: Temp:  [96.3 F (35.7 C)-99.9 F (37.7 C)] 99.5 F (37.5 C) (10/14 0800) Pulse Rate:  [86-91] 89 (10/14 0800) Cardiac Rhythm:  [-] Normal sinus rhythm (10/14 0800) Resp:  [10-27] 19 (10/14 0800) BP: (79-119)/(48-76) 111/66 mmHg (10/14 0800) SpO2:  [91 %-100 %] 98 % (10/14 0800) Arterial Line BP: (80-142)/(47-98) 130/66 mmHg (10/14 0700) FiO2 (%):  [40 %-50 %] 40 % (10/13 2245) Weight:  [106 lb 14.4 oz (48.49 kg)] 106 lb 14.4 oz (48.49 kg) (10/14 0358)  Hemodynamic parameters for last 24 hours: PAP: (19-32)/(10-19) 32/19 mmHg CO:  [4.2 L/min-6.1 L/min] 5.1 L/min CI:  [3 L/min/m2-4.3 L/min/m2] 3.6 L/min/m2  Intake/Output from previous day: 10/13 0701 - 10/14 0700 In: 6749.1 [P.O.:240; I.V.:4018.1; Blood:581; NG/GT:60; IV Piggyback:1850] Out: 1779 [Urine:2840; Blood:1400; Chest Tube:460] Intake/Output this shift: Total I/O In: 26.7 [I.V.:26.7] Out: 45 [Urine:45]  Lungs clear Legs warm nsr  Lab Results:  Recent Labs  09/30/15 1850  09/30/15 1858 10/01/15 0426  WBC 10.1  --   --  12.1*  HGB 8.4*  < > 8.8* 8.8*  HCT 26.0*  < > 26.0* 27.4*  PLT 141*  --   --  150  < > = values in this interval not displayed. BMET:  Recent Labs  09/30/15 1858 10/01/15 0426  NA 135 135  K 3.7 4.0  CL 105 102  CO2  --  24  GLUCOSE 152* 111*  BUN 9 9  CREATININE 0.40* 0.45  CALCIUM  --  8.1*    PT/INR:  Recent Labs  09/30/15 1311  LABPROT 20.3*  INR 1.73*   ABG    Component Value Date/Time   PHART 7.318* 10/01/2015 0121   HCO3 23.3 10/01/2015 0121   TCO2 25 10/01/2015 0121   ACIDBASEDEF 3.0* 10/01/2015 0121   O2SAT 85.0 10/01/2015 0121   CBG (last 3)   Recent Labs  09/30/15 1922 09/30/15 2322 10/01/15 0347  GLUCAP 146* 91 90    Assessment/Plan: S/P  Procedure(s) (LRB): AORTIC VALVE REPLACEMENT (AVR) (N/A) TRANSESOPHAGEAL ECHOCARDIOGRAM (TEE) (N/A) Mobilize d/c tubes/lines See progression orders   LOS: 1 day    Stephanie Velasquez 10/01/2015

## 2015-10-01 NOTE — Care Management Note (Signed)
Case Management Note  Patient Details  Name: Stephanie Velasquez MRN: [id number] Date of Birth: 1956-02-28  Subjective/Objective:       Plan for dc home with husband 24-7             Action/Plan:   Expected Discharge Date:                  Expected Discharge Plan:  Home/Self Care  In-House Referral:     Discharge planning Services     Post Acute Care Choice:    Choice offered to:     DME Arranged:    DME Agency:     HH Arranged:    HH Agency:     Status of Service:  In process, will continue to follow  Medicare Important Message Given:    Date Medicare IM Given:    Medicare IM give by:    Date Additional Medicare IM Given:    Additional Medicare Important Message give by:     If discussed at [place] of Stay Meetings, dates discussed:    Additional Comments:  Vergie Living, RN 10/01/2015, 3:03 PM

## 2015-10-01 NOTE — Progress Notes (Signed)
Patient performed -20 NIF and 455ml FVC.

## 2015-10-01 NOTE — Progress Notes (Signed)
      [address]       [place] [address]             [phone]      Up in chair  BP 116/70 mmHg  Pulse 87  Temp(Src) 98.8 F (37.1 C) (Oral)  Resp 21  Wt 106 lb 14.4 oz (48.49 kg)  SpO2 98%   Intake/Output Summary (Last 24 hours) at 10/01/15 1650 Last data filed at 10/01/15 1500  Gross per 24 hour  Intake 1706.78 ml  Output   2370 ml  Net -663.22 ml   PM labs pending  POD # 1 AVR, continue current care  Ronit Marczak C. Roxan Hockey, MD [facility] [phone]

## 2015-10-02 ENCOUNTER — Inpatient Hospital Stay (HOSPITAL_COMMUNITY): Payer: Medicare Other

## 2015-10-02 LAB — GLUCOSE, CAPILLARY
Glucose-Capillary: 109 mg/dL — ABNORMAL HIGH (ref 65–99)
Glucose-Capillary: 109 mg/dL — ABNORMAL HIGH (ref 65–99)
Glucose-Capillary: 111 mg/dL — ABNORMAL HIGH (ref 65–99)
Glucose-Capillary: 111 mg/dL — ABNORMAL HIGH (ref 65–99)
Glucose-Capillary: 121 mg/dL — ABNORMAL HIGH (ref 65–99)
Glucose-Capillary: 141 mg/dL — ABNORMAL HIGH (ref 65–99)
Glucose-Capillary: 154 mg/dL — ABNORMAL HIGH (ref 65–99)

## 2015-10-02 LAB — CBC
HCT: 25.9 % — ABNORMAL LOW (ref 36.0–46.0)
Hemoglobin: 8.4 g/dL — ABNORMAL LOW (ref 12.0–15.0)
MCH: 29.3 pg (ref 26.0–34.0)
MCHC: 32.4 g/dL (ref 30.0–36.0)
MCV: 90.2 fL (ref 78.0–100.0)
Platelets: 113 10*3/uL — ABNORMAL LOW (ref 150–400)
RBC: 2.87 MIL/uL — ABNORMAL LOW (ref 3.87–5.11)
RDW: 20.5 % — ABNORMAL HIGH (ref 11.5–15.5)
WBC: 7.8 10*3/uL (ref 4.0–10.5)

## 2015-10-02 LAB — BASIC METABOLIC PANEL
Anion gap: 7 (ref 5–15)
BUN: 7 mg/dL (ref 6–20)
CO2: 30 mmol/L (ref 22–32)
Calcium: 8.2 mg/dL — ABNORMAL LOW (ref 8.9–10.3)
Chloride: 98 mmol/L — ABNORMAL LOW (ref 101–111)
Creatinine, Ser: 0.39 mg/dL — ABNORMAL LOW (ref 0.44–1.00)
GFR calc Af Amer: >60 mL/min (ref >60)
GFR calc non Af Amer: >60 mL/min (ref >60)
Glucose, Bld: 108 mg/dL — ABNORMAL HIGH (ref 65–99)
Potassium: 3.8 mmol/L (ref 3.5–5.1)
Sodium: 135 mmol/L (ref 135–145)

## 2015-10-02 MED ORDER — INSULIN ASPART 100 UNIT/ML ~~LOC~~ SOLN
0.0000 [IU] | Freq: Three times a day (TID) | SUBCUTANEOUS | Status: DC
  Administered 2015-10-03 (×2): 1 [IU] via SUBCUTANEOUS

## 2015-10-02 MED ORDER — PNEUMOCOCCAL VAC POLYVALENT 25 MCG/0.5ML IJ INJ
0.5000 mL | INJECTION | INTRAMUSCULAR | Status: DC
  Filled 2015-10-02: qty 0.5

## 2015-10-02 MED ORDER — CETYLPYRIDINIUM CHLORIDE 0.05 % MT LIQD
7.0000 mL | Freq: Two times a day (BID) | OROMUCOSAL | Status: DC
  Administered 2015-10-02 – 2015-10-06 (×5): 7 mL via OROMUCOSAL

## 2015-10-02 MED ORDER — INFLUENZA VAC SPLIT QUAD 0.5 ML IM SUSY
0.5000 mL | PREFILLED_SYRINGE | INTRAMUSCULAR | Status: DC
  Filled 2015-10-02: qty 0.5

## 2015-10-02 NOTE — Progress Notes (Signed)
2 Days Post-Op Procedure(s) (LRB): AORTIC VALVE REPLACEMENT (AVR) (N/A) TRANSESOPHAGEAL ECHOCARDIOGRAM (TEE) (N/A) Subjective: C/o back pain Mild incisional pain as well   Objective: Vital signs in last 24 hours: Temp:  [98.3 F (36.8 C)-99 F (37.2 C)] 98.6 F (37 C) (10/15 0700) Pulse Rate:  [66-88] 88 (10/15 0900) Cardiac Rhythm:  [-] Atrial paced (10/15 0800) Resp:  [15-24] 23 (10/15 0900) BP: (101-132)/(67-81) 108/77 mmHg (10/15 0900) SpO2:  [91 %-100 %] 96 % (10/15 0900) Weight:  [103 lb 3.2 oz (46.811 kg)] 103 lb 3.2 oz (46.811 kg) (10/15 0600)  Hemodynamic parameters for last 24 hours:    Intake/Output from previous day: 10/14 0701 - 10/15 0700 In: 1176.7 [P.O.:810; I.V.:266.7; IV Piggyback:100] Out: 9937 [Urine:1280; Chest Tube:275] Intake/Output this shift: Total I/O In: 500 [P.O.:480; I.V.:20] Out: 510 [Urine:500; Chest Tube:10]  General appearance: alert, cooperative and no distress Neurologic: intact Heart: regular rate and rhythm Lungs: diminished breath sounds bibasilar Abdomen: normal findings: soft, non-tender  Lab Results:  Recent Labs  10/01/15 1645 10/01/15 1652 10/02/15 0430  WBC 9.0  --  7.8  HGB 8.8* 9.9* 8.4*  HCT 27.6* 29.0* 25.9*  PLT 122*  --  113*   BMET:  Recent Labs  10/01/15 0426  10/01/15 1652 10/02/15 0430  NA 135  --  134* 135  K 4.0  --  3.8 3.8  CL 102  --  95* 98*  CO2 24  --   --  30  GLUCOSE 111*  --  164* 108*  BUN 9  --  12 7  CREATININE 0.45  < > 0.50 0.39*  CALCIUM 8.1*  --   --  8.2*  < > = values in this interval not displayed.  PT/INR:  Recent Labs  09/30/15 1311  LABPROT 20.3*  INR 1.73*   ABG    Component Value Date/Time   PHART 7.318* 10/01/2015 0121   HCO3 23.3 10/01/2015 0121   TCO2 24 10/01/2015 1652   ACIDBASEDEF 3.0* 10/01/2015 0121   O2SAT 85.0 10/01/2015 0121   CBG (last 3)   Recent Labs  10/01/15 2353 10/02/15 0425 10/02/15 0849  GLUCAP 109* 121* 154*     Assessment/Plan: S/P Procedure(s) (LRB): AORTIC VALVE REPLACEMENT (AVR) (N/A) TRANSESOPHAGEAL ECHOCARDIOGRAM (TEE) (N/A) -  CV- s/p AVR, tissue valve- no coumadin  Atrially paced, SB under  RESP- COPD- continue IS, nebs  RENAL- creatinine and lytes OK  ENDO- CBG OK, change to AC/HS  Deconditioning- mobilize as tolerated   LOS: 2 days    Melrose Nakayama 10/02/2015

## 2015-10-02 NOTE — Plan of Care (Signed)
Problem: Phase II - Intermediate Post-Op Goal: Pain controlled with appropriate interventions Outcome: Completed/Met Date Met:  10/02/15 History of chronic pain. Surgical pain controlled with interventions

## 2015-10-02 NOTE — Progress Notes (Signed)
      [address]       [place] [address]             [phone]      Stable day  Ambulated 3 times  BP 104/69 mmHg  Pulse 73  Temp(Src) 98 F (36.7 C) (Oral)  Resp 20  Wt 103 lb 3.2 oz (46.811 kg)  SpO2 98%   Intake/Output Summary (Last 24 hours) at 10/02/15 1941 Last data filed at 10/02/15 1900  Gross per 24 hour  Intake   1560 ml  Output   3155 ml  Net  -1595 ml    Remo Lipps C. Roxan Hockey, MD [facility] [phone]

## 2015-10-03 ENCOUNTER — Inpatient Hospital Stay (HOSPITAL_COMMUNITY): Payer: Medicare Other

## 2015-10-03 LAB — GLUCOSE, CAPILLARY
Glucose-Capillary: 102 mg/dL — ABNORMAL HIGH (ref 65–99)
Glucose-Capillary: 126 mg/dL — ABNORMAL HIGH (ref 65–99)
Glucose-Capillary: 121 mg/dL — ABNORMAL HIGH (ref 65–99)
Glucose-Capillary: 99 mg/dL (ref 65–99)

## 2015-10-03 LAB — CBC
HCT: 24 % — ABNORMAL LOW (ref 36.0–46.0)
Hemoglobin: 7.6 g/dL — ABNORMAL LOW (ref 12.0–15.0)
MCH: 28.8 pg (ref 26.0–34.0)
MCHC: 31.7 g/dL (ref 30.0–36.0)
MCV: 90.9 fL (ref 78.0–100.0)
Platelets: 115 10*3/uL — ABNORMAL LOW (ref 150–400)
RBC: 2.64 MIL/uL — ABNORMAL LOW (ref 3.87–5.11)
RDW: 20.3 % — ABNORMAL HIGH (ref 11.5–15.5)
WBC: 6.1 10*3/uL (ref 4.0–10.5)

## 2015-10-03 LAB — BASIC METABOLIC PANEL WITH GFR
Anion gap: 9 (ref 5–15)
BUN: <5 mg/dL — ABNORMAL LOW (ref 6–20)
CO2: 34 mmol/L — ABNORMAL HIGH (ref 22–32)
Calcium: 8.6 mg/dL — ABNORMAL LOW (ref 8.9–10.3)
Chloride: 98 mmol/L — ABNORMAL LOW (ref 101–111)
Creatinine, Ser: 0.36 mg/dL — ABNORMAL LOW (ref 0.44–1.00)
GFR calc Af Amer: >60 mL/min
GFR calc non Af Amer: >60 mL/min
Glucose, Bld: 107 mg/dL — ABNORMAL HIGH (ref 65–99)
Potassium: 3.7 mmol/L (ref 3.5–5.1)
Sodium: 141 mmol/L (ref 135–145)

## 2015-10-03 MED ORDER — SODIUM CHLORIDE 0.9 % IJ SOLN: 3.0000 mL | INTRAMUSCULAR | Status: DC | PRN

## 2015-10-03 MED ORDER — POTASSIUM CHLORIDE 10 MEQ/50ML IV SOLN
10.0000 meq | INTRAVENOUS | Status: AC
  Administered 2015-10-03 (×3): 10 meq via INTRAVENOUS
  Filled 2015-10-03 (×3): qty 50

## 2015-10-03 MED ORDER — MAGNESIUM HYDROXIDE 400 MG/5ML PO SUSP: 30.0000 mL | Freq: Every day | ORAL | Status: DC | PRN

## 2015-10-03 MED ORDER — LEVALBUTEROL HCL 1.25 MG/0.5ML IN NEBU
1.2500 mg | INHALATION_SOLUTION | Freq: Four times a day (QID) | RESPIRATORY_TRACT | Status: DC | PRN
  Filled 2015-10-03: qty 0.5

## 2015-10-03 MED ORDER — MOVING RIGHT ALONG BOOK
Freq: Once | Status: AC
  Administered 2015-10-03: 1
  Filled 2015-10-03: qty 1

## 2015-10-03 MED ORDER — POTASSIUM CHLORIDE CRYS ER 20 MEQ PO TBCR
20.0000 meq | EXTENDED_RELEASE_TABLET | Freq: Every day | ORAL | Status: DC
  Administered 2015-10-03 – 2015-10-07 (×5): 20 meq via ORAL
  Filled 2015-10-03 (×4): qty 1

## 2015-10-03 MED ORDER — SODIUM CHLORIDE 0.9 % IJ SOLN
3.0000 mL | Freq: Two times a day (BID) | INTRAMUSCULAR | Status: DC
  Administered 2015-10-03 – 2015-10-06 (×5): 3 mL via INTRAVENOUS

## 2015-10-03 MED ORDER — FUROSEMIDE 20 MG PO TABS
20.0000 mg | ORAL_TABLET | Freq: Every day | ORAL | Status: DC
  Administered 2015-10-03 – 2015-10-04 (×2): 20 mg via ORAL
  Filled 2015-10-03 (×2): qty 1

## 2015-10-03 MED ORDER — SUMATRIPTAN SUCCINATE 100 MG PO TABS
100.0000 mg | ORAL_TABLET | ORAL | Status: DC | PRN
  Filled 2015-10-03: qty 1

## 2015-10-03 MED ORDER — SODIUM CHLORIDE 0.9 % IV SOLN: 250.0000 mL | INTRAVENOUS | Status: DC | PRN

## 2015-10-03 MED ORDER — ALUM & MAG HYDROXIDE-SIMETH 200-200-20 MG/5ML PO SUSP: 15.0000 mL | ORAL | Status: DC | PRN

## 2015-10-03 MED ORDER — GUAIFENESIN-DM 100-10 MG/5ML PO SYRP: 15.0000 mL | ORAL_SOLUTION | ORAL | Status: DC | PRN

## 2015-10-03 NOTE — Progress Notes (Signed)
K+= 3.7 and creat= 0.36 w/ urine o/p > 30cc/hr; TCTS KCL protocol initiated with 10 mEq KCL in 50cc IV x 3, each over one hour.

## 2015-10-03 NOTE — Progress Notes (Signed)
3 Days Post-Op Procedure(s) (LRB): AORTIC VALVE REPLACEMENT (AVR) (N/A) TRANSESOPHAGEAL ECHOCARDIOGRAM (TEE) (N/A) Subjective: "I feel draggy today" Did well with ambulation per RN  Objective: Vital signs in last 24 hours: Temp:  [98 F (36.7 C)-98.5 F (36.9 C)] 98.3 F (36.8 C) (10/16 0800) Pulse Rate:  [73-101] 80 (10/16 0900) Cardiac Rhythm:  [-] Normal sinus rhythm (10/16 0800) Resp:  [13-22] 18 (10/16 0900) BP: (86-117)/(51-73) 99/58 mmHg (10/16 0900) SpO2:  [90 %-100 %] 99 % (10/16 0900) Weight:  [97 lb 7.1 oz (44.2 kg)] 97 lb 7.1 oz (44.2 kg) (10/16 0600)  Hemodynamic parameters for last 24 hours:    Intake/Output from previous day: 10/15 0701 - 10/16 0700 In: 2110 [P.O.:1920; I.V.:140; IV Piggyback:50] Out: 3135 [Urine:3125; Chest Tube:10] Intake/Output this shift: Total I/O In: 300 [P.O.:300] Out: 415 [Urine:415]  General appearance: alert, cooperative and no distress Neurologic: intact Heart: regular rate and rhythm Lungs: diminished breath sounds bibasilar Abdomen: normal findings: soft, non-tender No new neuro deficit  Lab Results:  Recent Labs  10/02/15 0430 10/03/15 0434  WBC 7.8 6.1  HGB 8.4* 7.6*  HCT 25.9* 24.0*  PLT 113* 115*   BMET:  Recent Labs  10/02/15 0430 10/03/15 0434  NA 135 141  K 3.8 3.7  CL 98* 98*  CO2 30 34*  GLUCOSE 108* 107*  BUN 7 <5*  CREATININE 0.39* 0.36*  CALCIUM 8.2* 8.6*    PT/INR:  Recent Labs  09/30/15 1311  LABPROT 20.3*  INR 1.73*   ABG    Component Value Date/Time   PHART 7.318* 10/01/2015 0121   HCO3 23.3 10/01/2015 0121   TCO2 24 10/01/2015 1652   ACIDBASEDEF 3.0* 10/01/2015 0121   O2SAT 85.0 10/01/2015 0121   CBG (last 3)   Recent Labs  10/02/15 1558 10/02/15 1921 10/03/15 0820  GLUCAP 111* 141* 102*    Assessment/Plan: S/P Procedure(s) (LRB): AORTIC VALVE REPLACEMENT (AVR) (N/A) TRANSESOPHAGEAL ECHOCARDIOGRAM (TEE) (N/A) Plan for transfer to step-down: see transfer orders    CV- AVR   in SR  On aspirin, statin, beta blocker  RESP_ COPD, no acitve wheezing at present  Symbicort scheduled, xopenex PRN  RENAL- supplement K, change lasix to PO  ENDO- CBG well controlled  Deconditioning- continue ambulation  Transfer to PTCU when bed available   LOS: 3 days    Melrose Nakayama 10/03/2015

## 2015-10-03 NOTE — Progress Notes (Signed)
Patient refused to walk, stated she would do it later. Kathryne Sharper, RN.

## 2015-10-04 ENCOUNTER — Inpatient Hospital Stay (HOSPITAL_COMMUNITY): Payer: Medicare Other

## 2015-10-04 LAB — CBC
HEMATOCRIT: 23.9 % — AB (ref 36.0–46.0)
HEMOGLOBIN: 7.4 g/dL — AB (ref 12.0–15.0)
MCH: 28.8 pg (ref 26.0–34.0)
MCHC: 31 g/dL (ref 30.0–36.0)
MCV: 93 fL (ref 78.0–100.0)
Platelets: 169 10*3/uL (ref 150–400)
RBC: 2.57 MIL/uL — AB (ref 3.87–5.11)
RDW: 19.6 % — ABNORMAL HIGH (ref 11.5–15.5)
WBC: 6.2 10*3/uL (ref 4.0–10.5)

## 2015-10-04 LAB — TYPE AND SCREEN
ABO/RH(D): A POS
Antibody Screen: NEGATIVE
Unit division: 0
Unit division: 0
Unit division: 0
Unit division: 0

## 2015-10-04 LAB — BASIC METABOLIC PANEL
ANION GAP: 9 (ref 5–15)
BUN: 9 mg/dL (ref 6–20)
CHLORIDE: 96 mmol/L — AB (ref 101–111)
CO2: 30 mmol/L (ref 22–32)
CREATININE: 0.41 mg/dL — AB (ref 0.44–1.00)
Calcium: 8.2 mg/dL — ABNORMAL LOW (ref 8.9–10.3)
GFR calc non Af Amer: >60 mL/min (ref >60)
Glucose, Bld: 99 mg/dL (ref 65–99)
POTASSIUM: 4.3 mmol/L (ref 3.5–5.1)
SODIUM: 135 mmol/L (ref 135–145)

## 2015-10-04 LAB — PREPARE RBC (CROSSMATCH)

## 2015-10-04 LAB — GLUCOSE, CAPILLARY: Glucose-Capillary: 88 mg/dL (ref 65–99)

## 2015-10-04 MED ORDER — ELDERTONIC PO ELIX
15.0000 mL | ORAL_SOLUTION | Freq: Three times a day (TID) | ORAL | Status: DC
  Administered 2015-10-04 – 2015-10-07 (×9): 15 mL via ORAL
  Filled 2015-10-04 (×13): qty 15

## 2015-10-04 MED ORDER — SODIUM CHLORIDE 0.9 % IV SOLN
Freq: Once | INTRAVENOUS | Status: AC
  Administered 2015-10-04: 10 mL/h via INTRAVENOUS

## 2015-10-04 NOTE — Progress Notes (Addendum)
      [address]       [place] [address]             [phone]      4 Days Post-Op Procedure(s) (LRB): AORTIC VALVE REPLACEMENT (AVR) (N/A) TRANSESOPHAGEAL ECHOCARDIOGRAM (TEE) (N/A)   Subjective:  Stephanie Velasquez complains of pain, mostly in her back.  She also has dyspnea with ambulation.  + BM  Objective: Vital signs in last 24 hours: Temp:  [97.7 F (36.5 C)-98 F (36.7 C)] 98 F (36.7 C) (10/17 0414) Pulse Rate:  [68-84] 76 (10/16 2136) Cardiac Rhythm:  [-] Normal sinus rhythm (10/17 0700) Resp:  [16-24] 17 (10/16 1952) BP: (84-115)/(58-67) 92/62 mmHg (10/17 0419) SpO2:  [92 %-100 %] 95 % (10/17 0812) Weight:  [101 lb 3.1 oz (45.9 kg)] 101 lb 3.1 oz (45.9 kg) (10/17 0421)  Intake/Output from previous day: 10/16 0701 - 10/17 0700 In: 300 [P.O.:300] Out: 1825 [Urine:1825] Intake/Output this shift: Total I/O In: 320 [P.O.:320] Out: -   General appearance: alert, cooperative and no distress Heart: regular rate and rhythm Lungs: diminished breath sounds bibasilar Abdomen: soft, non-tender; bowel sounds normal; no masses,  no organomegaly Extremities: edema none appreciated Wound: clean and dry  Lab Results:  Recent Labs  10/03/15 0434 10/04/15 0241  WBC 6.1 6.2  HGB 7.6* 7.4*  HCT 24.0* 23.9*  PLT 115* 169   BMET:  Recent Labs  10/03/15 0434 10/04/15 0241  NA 141 135  K 3.7 4.3  CL 98* 96*  CO2 34* 30  GLUCOSE 107* 99  BUN <5* 9  CREATININE 0.36* 0.41*  CALCIUM 8.6* 8.2*    PT/INR: No results for input(s): LABPROT, INR in the last 72 hours. ABG    Component Value Date/Time   PHART 7.318* 10/01/2015 0121   HCO3 23.3 10/01/2015 0121   TCO2 24 10/01/2015 1652   ACIDBASEDEF 3.0* 10/01/2015 0121   O2SAT 85.0 10/01/2015 0121   CBG (last 3)   Recent Labs  10/03/15 1643 10/03/15 2135 10/04/15 0611  GLUCAP 121* 99 88    Assessment/Plan: S/P Procedure(s) (LRB): AORTIC VALVE REPLACEMENT (AVR) (N/A) TRANSESOPHAGEAL  ECHOCARDIOGRAM (TEE) (N/A)  1. CV- NSR, Hypotension 80-90s SBP- will add parameters for lopressor 2. Pulm- small effusions R>L, H/O COPD- will have patient use oxygen for now 3. Renal- creatinine WNL, not significantly edematous- diuresis for effusions 4. Expected post operative blood loss anemia- Hgb down to 7.4, will hypotension and dyspnea- will transfuse 1 unit of packed cells 5. CBGs- controlled, patient not a diabetic, will d/c SSIP 6. Dispo- patient stable, will transfuse 1 unit for symptomatic anemia, repeat CXR in AM, d/c EPW   LOS: 4 days    Stephanie Velasquez, Stephanie Velasquez 10/04/2015  nsr atelectasis on CXR Will give 1 unit PRBCs for weakness, low O2 sats and expected postop blood loss anemia patient examined and medical record reviewed,agree with above note. Tharon Aquas Trigt III 10/04/2015

## 2015-10-04 NOTE — Progress Notes (Signed)
Pt had burst of SVT. Pt asleep at the time and HR is now returned to 59s. Will continue to monitor.  Stephanie Velasquez

## 2015-10-04 NOTE — Progress Notes (Signed)
Utilization review completed.  

## 2015-10-04 NOTE — Progress Notes (Signed)
CARDIAC REHAB PHASE I   PRE:  Rate/Rhythm: 83 SR  BP:  Sitting: 105/67        SaO2: 87 RA, 91 on 2L O2  MODE:  Ambulation: 150 ft   POST:  Rate/Rhythm: 88 SR  BP:  Sitting: 118/67         SaO2: 95 2L  Pt sitting up in chair, agreeable to ambulate, states she wants to go back to bed after walk. Pt sats on RA 87% at rest. Pt denies SOB, sats increased to 91% on 2L O2 at rest.  Pt ambulated 150 ft on 2L O2, gait belt, rolling walker, slow steady gait, tolerated fair. Pt c/o mild dizziness, DOE and fatigue. Pt declined to ambulate farther. Oxygen sats 95% on 2L O2. Encouraged additional ambulation, IS. Pt verbalized understanding. Pt to bed after walk, call bell within reach.   [phone]  Lenna Sciara, RN, BSN 10/04/2015 9:25 AM

## 2015-10-04 NOTE — Progress Notes (Signed)
Patient was out of breath upon ambulating to the standing scale. Breathing returned to normal when she sat down. Will continue to monitor.   Stephanie Velasquez H Gryphon Vanderveen

## 2015-10-04 NOTE — Progress Notes (Signed)
Removed epicardial wires per order. 3 intact.  Pt tolerated procedure well.  Pt instructed to remain on bedrest for one hour.  Frequent vitals will be taken and documented. Pt resting with call bell within reach. [place] Gipe McClintock, RN   

## 2015-10-04 NOTE — Care Management Important Message (Signed)
Important Message  Patient Details  Name: Stephanie Velasquez MRN: [id number] Date of Birth: 12-03-1956   Medicare Important Message Given:  Yes-second notification given    Nathen May 10/04/2015, 11:50 AM

## 2015-10-05 ENCOUNTER — Inpatient Hospital Stay (HOSPITAL_COMMUNITY): Payer: Medicare Other

## 2015-10-05 LAB — TYPE AND SCREEN
ABO/RH(D): A POS
Antibody Screen: NEGATIVE
UNIT DIVISION: 0

## 2015-10-05 LAB — CBC
HEMATOCRIT: 29.4 % — AB (ref 36.0–46.0)
Hemoglobin: 9.4 g/dL — ABNORMAL LOW (ref 12.0–15.0)
MCH: 28.9 pg (ref 26.0–34.0)
MCHC: 32 g/dL (ref 30.0–36.0)
MCV: 90.5 fL (ref 78.0–100.0)
PLATELETS: 196 10*3/uL (ref 150–400)
RBC: 3.25 MIL/uL — ABNORMAL LOW (ref 3.87–5.11)
RDW: 18.8 % — AB (ref 11.5–15.5)
WBC: 6.7 10*3/uL (ref 4.0–10.5)

## 2015-10-05 MED ORDER — FUROSEMIDE 20 MG PO TABS
20.0000 mg | ORAL_TABLET | Freq: Two times a day (BID) | ORAL | Status: DC
  Administered 2015-10-05 – 2015-10-07 (×5): 20 mg via ORAL
  Filled 2015-10-05 (×6): qty 1

## 2015-10-05 MED ORDER — INFLUENZA VAC SPLIT QUAD 0.5 ML IM SUSY
0.5000 mL | PREFILLED_SYRINGE | INTRAMUSCULAR | Status: DC
  Filled 2015-10-05: qty 0.5

## 2015-10-05 MED ORDER — PNEUMOCOCCAL VAC POLYVALENT 25 MCG/0.5ML IJ INJ
0.5000 mL | INJECTION | INTRAMUSCULAR | Status: DC
  Filled 2015-10-05: qty 0.5

## 2015-10-05 NOTE — Discharge Summary (Signed)
[address]       [place] [address]             [phone]              Discharge Summary  Name: Stephanie Velasquez DOB: Nov 24, 1956 59 y.o. MRN: [id number]   Admission Date: 09/30/2015 Discharge Date: 10/07/2015    Admitting Diagnosis: Severe aortic stenosis   Discharge Diagnosis:  Severe aortic stenosis Bicuspid aortic valve Expected postop blood loss anemia  Past Medical History  Diagnosis Date  . Hypertension   . Aortic valve disease   . Migraine   . Hypercholesterolemia     Simvastatin  . Anxiety and depression   . Low back pain   . Iron deficiency anemia   . Osteoarthritis     hands  . Breast cancer [facility])     Dr Bobby Rumpf Dr Rogelia Boga, right side 2011  . Raynaud's syndrome   . Diverticulitis   . Stomach ulcer   . Anxiety   . Depression   . GERD (gastroesophageal reflux disease)     Nexium  . Wears glasses   . Overactive bladder   . Constipation   . Heart murmur   . History of bronchitis   . Hearing difficulty of left ear   . Paralysis of right upper extremity ([place])   . Right arm numbness   . PONV (postoperative nausea and vomiting)     pt. states that she was very sick     Procedures: AORTIC VALVE REPLACEMENT (23 mm [facility] Ease pericardial tissue valve) -  09/30/2015    HPI:  The patient is a 58 y.o. female with a known history of heart murmur since age 11.  Echocardiograms demonstrated a bicuspid aortic valve. She has been followed by her primary care physician with serial echocardiograms. In 2014, a transthoracic echocardiogram demonstrated moderate aortic stenosis with a peak gradient of 50 mm mercury. She was asymptomatic.  Over the past 3-4 years, the patient has had progressive weight loss, decreased exercise intolerance and pedal edema.  An echocardiogram was performed which showed severe aortic stenosis with increase in transvalvular gradientt of close to 90 mm Hg. The images show a heavily calcified  valve very thickened. The LV outflow tract is approximately 18-20 mm. There is no significant mitral regurgitation or tricuspid regurgitation. LV systolic function is normal. There is mild LVH. The aortic root does not appear to be dilated. Cardiac catheterization showed normal coronaries with normal right heart pressures.  She was referred to Dr. Prescott Gum for cardiac surgical consultation. It was felt that she would benefit from aortic valve replacement at this time.  All risks, benefits and alternatives of surgery were explained in detail, and the patient agreed to proceed.    Hospital Course:  The patient was admitted to [facility] on 09/30/2015. The patient was taken to the operating room and underwent the above procedure.    The postoperative course has generally been uneventful.  She did require a transfusion of packed red blood cells for postop blood loss anemia.  She continues to require supplemental oxygen and it is felt that she will require home oxygen. She remains in sinus rhythm and blood pressures are low normal but stable. She has been volume overloaded and was started on Lasix, and is diuresing well. She did develop a productive cough. She was given Mucinex and put on Ceftin. The patient is overall doing well and is medically stable on today's  date for discharge home.    Recent vital signs:  Filed Vitals:   10/07/15 0447  BP: 111/67  Pulse: 66  Temp: 98.2 F (36.8 C)  Resp: 20    Recent laboratory studies:  CBC:  Recent Labs  10/05/15 0222  WBC 6.7  HGB 9.4*  HCT 29.4*  PLT 196   BMET:  No results for input(s): NA, K, CL, CO2, GLUCOSE, BUN, CREATININE, CALCIUM in the last 72 hours.  PT/INR: No results for input(s): LABPROT, INR in the last 72 hours.   Discharge Medications:     Medication List    TAKE these medications        aspirin 325 MG EC tablet  Take 1 tablet (325 mg total) by mouth daily.     budesonide-formoterol 160-4.5 MCG/ACT inhaler  Commonly  known as:  SYMBICORT  Inhale 2 puffs into the lungs 2 (two) times daily.     cefUROXime 250 MG tablet  Commonly known as:  CEFTIN  Take 1 tablet (250 mg total) by mouth 2 (two) times daily with a meal.     citalopram 20 MG tablet  Commonly known as:  CELEXA  Take 20 mg by mouth daily.     EMERGEN-C IMMUNE PO  Take 1 tablet by mouth daily.     esomeprazole 40 MG capsule  Commonly known as:  NEXIUM  Take 40 mg by mouth daily at 12 noon.     ferrous sulfate 325 (65 FE) MG tablet  Take 325 mg by mouth daily with breakfast.     GOODY HEADACHE PO  Take 1 each by mouth 2 (two) times daily as needed (PAIN).     guaiFENesin 600 MG 12 hr tablet  Commonly known as:  MUCINEX  Take 1 tablet (600 mg total) by mouth 2 (two) times daily. For cough     metoprolol tartrate 25 MG tablet  Commonly known as:  LOPRESSOR  Take 0.5 tablets (12.5 mg total) by mouth 2 (two) times daily.     oxyCODONE-acetaminophen 7.5-325 MG tablet  Commonly known as:  PERCOCET  Take 1 tablet by mouth every 8 (eight) hours as needed for severe pain.     OXYCONTIN 20 mg T12a 12 hr tablet  Generic drug:  OxyCODONE  Take 20 mg by mouth every 12 (twelve) hours.     promethazine 25 MG tablet  Commonly known as:  PHENERGAN  Take 25 mg by mouth every 6 (six) hours as needed for nausea or vomiting.     ranitidine 150 MG tablet  Commonly known as:  ZANTAC  Take 150 mg by mouth 2 (two) times daily.     simvastatin 40 MG tablet  Commonly known as:  ZOCOR  Take 40 mg by mouth daily.     solifenacin 5 MG tablet  Commonly known as:  VESICARE  Take 5 mg by mouth daily.     SUMAtriptan 100 MG tablet  Commonly known as:  IMITREX  Take 100 mg by mouth every 2 (two) hours as needed for migraine. May repeat in 2 hours if headache persists or recurs.     traMADol 50 MG tablet  Commonly known as:  ULTRAM  Take 1 tablet (50 mg total) by mouth every 6 (six) hours as needed for moderate pain.         Discharge  Instructions:  The patient is to refrain from driving, heavy lifting or strenuous activity.  May shower daily and clean incisions with soap and water.  May resume regular diet.    Follow Up: Follow-up Information    Follow up with Ivin Poot III, MD On 11/03/2015.   Specialty:  Cardiothoracic Surgery   Why:  Have a chest x-ray at [facility] at 1:00, then see MD at 2:00   Contact information:   [address] [place] [address] [phone]       Follow up with Glori Bickers, MD.   Specialty:  Cardiology   Why:  Call for a follow up appointment for 2 weeks   Contact information:   [address] [place] [address] [phone]       Follow up with [facility].   Why:  home 02 arranged- portable tank to be delivered to room prior to discharge   Contact information:   [address] [place] [address] [phone]       Follow up with [facility].   Why:  HH-RN arranged   Contact information:   [place] [address] [phone]       Discharge Instructions    Amb Referral to Cardiac Rehabilitation    Complete by:  As directed   Diagnosis:  Valve Replacement/Repair Comment - referring to [place]          The patient has been discharged on:  1.Beta Blocker: Yes [ x ]  No [ ]   If No, reason:    2.Ace Inhibitor/ARB: Yes [ ]   No [ x ]  If No, reason: No CAD and Labile BP   3.Statin: Yes [ x ]  No [  ]  If No, reason:    4.Ecasa: Yes [ x ]  No [ ]   If No, reason:   Follow-up Information    Follow up with Ivin Poot III, MD On 11/03/2015.   Specialty:  Cardiothoracic Surgery   Why:  Have a chest x-ray at [facility] at 1:00, then see MD at 2:00   Contact information:   [address] [place] [address] [phone]       Follow up with Glori Bickers, MD.   Specialty:  Cardiology   Why:  Call for a  follow up appointment for 2 weeks   Contact information:   [address] [place] [address] [phone]       Follow up with [facility].   Why:  home 02 arranged- portable tank to be delivered to room prior to discharge   Contact information:   [address] [place] [address] [phone]       Follow up with [facility].   Why:  HH-RN arranged   Contact information:   [place] [address] [phone]        Arnoldo Lenis 10/07/2015, 8:02 AM

## 2015-10-05 NOTE — Progress Notes (Signed)
Patient ambulated 150 ft with minimal assistance using a RW, pt tolerated well, VSS.  Sharene Skeans , RN

## 2015-10-05 NOTE — Progress Notes (Signed)
CARDIAC REHAB PHASE I   PRE:  Rate/Rhythm: 69 SR  BP:  Supine:   Sitting: 113/74  Standing:    SaO2: 98% 2L  96%RA  MODE:  Ambulation: 350 ft   POST:  Rate/Rhythm: 78 SR  BP:  Supine:   Sitting: 131/73  Standing:    SaO2: 90-94%RA 1307-1347 Pt walked 350 ft on RA with rolling walker and asst x 1. I monitored sats whole walk. Did well on RA. To recliner after walk and left off oxygen. Encouraged pt to call RN if she feels SOB. Encouraged IS. Began discussion on low sodium restriction of 2000mg .   Graylon Good, RN BSN  10/05/2015 1:43 PM

## 2015-10-05 NOTE — Progress Notes (Addendum)
      [address]       [place] [address]             [phone]        5 Days Post-Op Procedure(s) (LRB): AORTIC VALVE REPLACEMENT (AVR) (N/A) TRANSESOPHAGEAL ECHOCARDIOGRAM (TEE) (N/A)  Subjective: Has some shortness of breath with ambulation.  Objective: Vital signs in last 24 hours: Temp:  [98 F (36.7 C)-98.4 F (36.9 C)] 98.4 F (36.9 C) (10/18 0521) Pulse Rate:  [67-76] 70 (10/18 0521) Cardiac Rhythm:  [-] Normal sinus rhythm;Bundle branch block (10/17 1900) Resp:  [16-18] 17 (10/18 0521) BP: (95-135)/(60-88) 135/88 mmHg (10/18 0521) SpO2:  [95 %-99 %] 97 % (10/18 0521) Weight:  [102 lb 15.3 oz (46.7 kg)] 102 lb 15.3 oz (46.7 kg) (10/18 0348)  Pre op weight 44 kg Current Weight  10/05/15 102 lb 15.3 oz (46.7 kg)      Intake/Output from previous day: 10/17 0701 - 10/18 0700 In: 1135 [P.O.:800; Blood:335] Out: -    Physical Exam:  Cardiovascular: RRR, no murmurs Pulmonary: Diminished at bases bilaterally; no rales, wheezes, or rhonchi. Abdomen: Soft, non tender, bowel sounds present. Extremities: Trace bilateral lower extremity edema. Wounds: Clean and dry.  No erythema or signs of infection.  Lab Results: CBC: Recent Labs  10/04/15 0241 10/05/15 0222  WBC 6.2 6.7  HGB 7.4* 9.4*  HCT 23.9* 29.4*  PLT 169 196   BMET:  Recent Labs  10/03/15 0434 10/04/15 0241  NA 141 135  K 3.7 4.3  CL 98* 96*  CO2 34* 30  GLUCOSE 107* 99  BUN <5* 9  CREATININE 0.36* 0.41*  CALCIUM 8.6* 8.2*    PT/INR:  Lab Results  Component Value Date   INR 1.73* 09/30/2015   INR 1.68* 09/30/2015   INR 1.01 09/24/2015   ABG:  INR: Will add last result for INR, ABG once components are confirmed Will add last 4 CBG results once components are confirmed  Assessment/Plan:  1. CV - SR in the 70's. On Lopressor 12.5 mg bid 2.  Pulmonary - On 2 liters of oxygen via [place]. She has a history of COPD. CXR this am appears to shows bilateral pleural  effusions and bibasilar atelectasis, no pneumothorax.Encourage incentive spirometer and flutter valve. 3. Volume Overload - On Lasix 20 mg daily. Will give bid today. 4.  Acute blood loss anemia - H and H up to 9.4 and 29.4 s/p transfusion. Continue ferrous sulfate. 5. Likely discharge in 1-2 days  ZIMMERMAN,DONIELLE MPA-C 10/05/2015,7:34 AM  Chest x-ray personally reviewed and is satisfactory She will probably need home oxygen because of history of COPD and active smoking No Coumadin for her biologic valve in sinus rhythm Home health nurse for restorative care and to monitor oxygen needs patient examined and medical record reviewed,agree with above note. Tharon Aquas Trigt III 10/05/2015

## 2015-10-05 NOTE — Progress Notes (Signed)
Patient's son and daughter law were at the bedside earlier tonight. No distress, only pain was from the surgery. Gave medications, call light within reach.

## 2015-10-06 MED ORDER — AMLODIPINE BESYLATE 5 MG PO TABS: 5.0000 mg | ORAL_TABLET | Freq: Every day | ORAL | Status: DC

## 2015-10-06 MED ORDER — ATORVASTATIN CALCIUM 20 MG PO TABS
20.0000 mg | ORAL_TABLET | Freq: Every day | ORAL | Status: DC
  Administered 2015-10-06: 20 mg via ORAL
  Filled 2015-10-06: qty 1

## 2015-10-06 MED ORDER — TRAMADOL HCL 50 MG PO TABS: 50.0000 mg | ORAL_TABLET | Freq: Four times a day (QID) | ORAL | Status: DC | PRN

## 2015-10-06 MED ORDER — BUDESONIDE-FORMOTEROL FUMARATE 160-4.5 MCG/ACT IN AERO: 2.0000 | INHALATION_SPRAY | Freq: Two times a day (BID) | RESPIRATORY_TRACT | Status: DC

## 2015-10-06 MED ORDER — GUAIFENESIN ER 600 MG PO TB12: 600.0000 mg | ORAL_TABLET | Freq: Two times a day (BID) | ORAL | Status: DC

## 2015-10-06 MED ORDER — AMLODIPINE BESYLATE 5 MG PO TABS
5.0000 mg | ORAL_TABLET | Freq: Every day | ORAL | Status: DC
  Administered 2015-10-06 – 2015-10-07 (×2): 5 mg via ORAL
  Filled 2015-10-06 (×2): qty 1

## 2015-10-06 MED ORDER — ASPIRIN 325 MG PO TBEC: 325.0000 mg | DELAYED_RELEASE_TABLET | Freq: Every day | ORAL | Status: DC

## 2015-10-06 MED ORDER — CEFUROXIME AXETIL 500 MG PO TABS
250.0000 mg | ORAL_TABLET | Freq: Two times a day (BID) | ORAL | Status: DC
  Administered 2015-10-06 – 2015-10-07 (×2): 250 mg via ORAL
  Filled 2015-10-06 (×3): qty 1

## 2015-10-06 MED ORDER — BUDESONIDE-FORMOTEROL FUMARATE 160-4.5 MCG/ACT IN AERO
2.0000 | INHALATION_SPRAY | Freq: Two times a day (BID) | RESPIRATORY_TRACT | Status: DC
Start: 2015-10-06 — End: 2015-10-06

## 2015-10-06 MED ORDER — METOPROLOL TARTRATE 25 MG PO TABS: 12.5000 mg | ORAL_TABLET | Freq: Two times a day (BID) | ORAL | Status: DC

## 2015-10-06 NOTE — Progress Notes (Addendum)
      [address]       [place] [address]             [phone]        6 Days Post-Op Procedure(s) (LRB): AORTIC VALVE REPLACEMENT (AVR) (N/A) TRANSESOPHAGEAL ECHOCARDIOGRAM (TEE) (N/A)  Subjective: Has some shortness of breath with ambulation and productive cough (mostly clear)  Objective: Vital signs in last 24 hours: Temp:  [98.1 F (36.7 C)-98.2 F (36.8 C)] 98.2 F (36.8 C) (10/19 0428) Pulse Rate:  [65-78] 70 (10/19 0428) Cardiac Rhythm:  [-] Normal sinus rhythm (10/19 0708) Resp:  [18-20] 20 (10/19 0428) BP: (103-158)/(70-81) 158/81 mmHg (10/19 0428) SpO2:  [93 %-96 %] 95 % (10/19 0500) Weight:  [97 lb 3.6 oz (44.1 kg)] 97 lb 3.6 oz (44.1 kg) (10/19 0428)  Pre op weight 44 kg Current Weight  10/06/15 97 lb 3.6 oz (44.1 kg)      Intake/Output from previous day: 10/18 0701 - 10/19 0700 In: 883 [P.O.:880; I.V.:3] Out: 430 [Urine:430]   Physical Exam:  Cardiovascular: RRR, no murmurs Pulmonary: Diminished at bases bilaterally; no rales, wheezes, or rhonchi. Abdomen: Soft, non tender, bowel sounds present. Extremities: Trace bilateral lower extremity edema. Wounds: Clean and dry.  No erythema or signs of infection.  Lab Results: CBC:  Recent Labs  10/04/15 0241 10/05/15 0222  WBC 6.2 6.7  HGB 7.4* 9.4*  HCT 23.9* 29.4*  PLT 169 196   BMET:   Recent Labs  10/04/15 0241  NA 135  K 4.3  CL 96*  CO2 30  GLUCOSE 99  BUN 9  CREATININE 0.41*  CALCIUM 8.2*    PT/INR:  Lab Results  Component Value Date   INR 1.73* 09/30/2015   INR 1.68* 09/30/2015   INR 1.01 09/24/2015   ABG:  INR: Will add last result for INR, ABG once components are confirmed Will add last 4 CBG results once components are confirmed  Assessment/Plan:  1. CV - SR in the 70's. On Lopressor 12.5 mg bid. SBP increasing into the 130's+. Will add Norvasc 2.  Pulmonary - On 2 liters of oxygen via [place]. She has a history of COPD. She does desat with  ambulation and will likely need home oxygen. On Symbicort, Mucinex, and Xopenex PRN. Encourage incentive spirometer and flutter valve. 3. Volume Overload - On Lasix 20 mg daily.  4.  Acute blood loss anemia - Last H and H up to 9.4 and 29.4 s/p transfusion. Continue ferrous sulfate. 5. As discussed with Dr. Prescott Gum, possibly home with oxygen in am  Abyan Cadman MPA-C 10/06/2015,7:56 AM

## 2015-10-06 NOTE — Progress Notes (Signed)
CARDIAC REHAB PHASE I   PRE:  Rate/Rhythm: 81 SR  BP:  Supine:   Sitting: 132/80  Standing:    SaO2: 93%RA  MODE:  Ambulation: 550 ft   POST:  Rate/Rhythm: 75 SR  BP:  Supine:   Sitting: 145/92  Standing:    SaO2: 85% RA at 400 ft , 98  SATURATION QUALIFICATIONS: (This note is used to comply with regulatory documentation for home oxygen)  Patient Saturations on Room Air at Rest = 93%  Patient Saturations on Room Air while Ambulating = 85%  Patient Saturations on 2 Liters of oxygen while Ambulating = 97%  Please briefly explain why patient needs home oxygen: desat when walking  1011-1122 Pt walked 550 ft with rolling walker independently. Pt did well on RA until at 400 ft she desat to 85%. Put on 2L to keep sats up rest of walk and to 97%. Discussed with pt may need home oxygen for walking and she was ok with this. To chair after walk. Education completed with pt and husband who voiced understanding. Encouraged IS and flutter valve. Discussed watching sodium and handouts given. Discussed smoking cessation and fake cigarette given. Handout given and pt plans to quit cold [place]. Discussed CRP 2 and will refer to [facility] [place]. Wrote down how to view post op video.     Graylon Good, RN BSN  10/06/2015 11:18 AM

## 2015-10-06 NOTE — Progress Notes (Signed)
Patient ambulated to the bathroom, pt became sob, RR 20 saO2 88% on RA. RN placed patient on 1L of oxygen, reassessed saO2 95%. Patient is resting comfortable in bed, call bell within reach, no needs at this time, continue to assess.  Sharene Skeans, RN

## 2015-10-06 NOTE — Progress Notes (Signed)
Patient wants to remain in the chair at this time.  Pain medication given. No other needs expressed.

## 2015-10-06 NOTE — Care Management Note (Addendum)
Case Management Note CM note started by Luz Lex RNCM  Patient Details  Name: Stephanie Velasquez MRN: [id number] Date of Birth: [birth year]  Subjective/Objective:       Plan for dc home with husband 24-7             Action/Plan:   Expected Discharge Date:     10/07/15             Expected Discharge Plan:  Home/Self Care  In-House Referral:     Discharge planning Services  CM Consult  Post Acute Care Choice:  Durable Medical Equipment, Home Health Choice offered to:  Patient  DME Arranged:  Oxygen DME Agency:  [place]:   [place]:   [place].  Status of Service:  Completed, signed off  Medicare Important Message Given:  Yes-second notification given Date Medicare IM Given:    Medicare IM give by:    Date Additional Medicare IM Given:    Additional Medicare Important Message give by:     If discussed at [place] of Stay Meetings, dates discussed:    Additional Comments:  10/06/15- referral for home 02- spoke with pt and husband at bedside- discussed DME needs and agency options- pt agreeable to [facility] for home 02- spoke with Jermaine with [facility] regarding home 02 needs. - portable tank to be delivered to room prior to discharge.  Order also placed for [place]- offered choice for [facility] agency in [facility]- per pt choice she would like to use [facility] for [facility] services- referral called to Santiago Glad with [facility] for HH-RN  Dawayne Patricia, RN 10/06/2015, 11:53 AM

## 2015-10-07 ENCOUNTER — Other Ambulatory Visit: Payer: Self-pay | Admitting: Physician Assistant

## 2015-10-07 MED ORDER — PNEUMOCOCCAL VAC POLYVALENT 25 MCG/0.5ML IJ INJ: 0.5000 mL | INJECTION | INTRAMUSCULAR | Status: DC

## 2015-10-07 MED ORDER — CEFUROXIME AXETIL 250 MG PO TABS: 250.0000 mg | ORAL_TABLET | Freq: Two times a day (BID) | ORAL | Status: DC

## 2015-10-07 MED ORDER — INFLUENZA VAC SPLIT QUAD 0.5 ML IM SUSY
0.5000 mL | PREFILLED_SYRINGE | INTRAMUSCULAR | Status: DC
Start: 2015-10-08 — End: 2015-10-07

## 2015-10-07 NOTE — Progress Notes (Signed)
      [address]       [place] [address]             [phone]        7 Days Post-Op Procedure(s) (LRB): AORTIC VALVE REPLACEMENT (AVR) (N/A) TRANSESOPHAGEAL ECHOCARDIOGRAM (TEE) (N/A)  Subjective: Patient had a better night. She hopes to go home today.  Objective: Vital signs in last 24 hours: Temp:  [97.7 F (36.5 C)-98.2 F (36.8 C)] 98.2 F (36.8 C) (10/20 0447) Pulse Rate:  [66-71] 66 (10/20 0447) Cardiac Rhythm:  [-] Normal sinus rhythm (10/19 1952) Resp:  [20] 20 (10/20 0447) BP: (99-132)/(66-80) 111/67 mmHg (10/20 0447) SpO2:  [94 %-100 %] 100 % (10/20 0447) Weight:  [99 lb 10.4 oz (45.2 kg)] 99 lb 10.4 oz (45.2 kg) (10/20 0447)  Pre op weight 44 kg Current Weight  10/07/15 99 lb 10.4 oz (45.2 kg)      Intake/Output from previous day: 10/19 0701 - 10/20 0700 In: 780 [P.O.:777; I.V.:3] Out: -    Physical Exam:  Cardiovascular: RRR, no murmurs Pulmonary: Diminished at bases bilaterally; no rales, wheezes, or rhonchi. Abdomen: Soft, non tender, bowel sounds present. Extremities: No lower extremity edema. Wounds: Clean and dry.  No erythema or signs of infection.  Lab Results: CBC:  Recent Labs  10/05/15 0222  WBC 6.7  HGB 9.4*  HCT 29.4*  PLT 196   BMET:  No results for input(s): NA, K, CL, CO2, GLUCOSE, BUN, CREATININE, CALCIUM in the last 72 hours.  PT/INR:  Lab Results  Component Value Date   INR 1.73* 09/30/2015   INR 1.68* 09/30/2015   INR 1.01 09/24/2015   ABG:  INR: Will add last result for INR, ABG once components are confirmed Will add last 4 CBG results once components are confirmed  Assessment/Plan:  1. CV - SR in the 70's. On Lopressor 12.5 mg bid. SBP increasing into the 130's+ yesterday and Norvasc was added. BP somewhat labile last 12 hours. Will stop Norvasc. 2.  Pulmonary - On 2 liters of oxygen via [place]. She has a history of COPD. She does desat with ambulation and will likely need home oxygen. On  Symbicort, Mucinex, and Xopenex PRN. Encourage incentive spirometer and flutter valve. 3. Volume Overload - On Lasix 20 mg bid. Will stop. 4.  Acute blood loss anemia - Last H and H up to 9.4 and 29.4 s/p transfusion. Continue ferrous sulfate. 5. On Ceftin for productive cough 6. As discussed with Dr. Prescott Gum, likely home with oxygen today  ZIMMERMAN,DONIELLE MPA-C 10/07/2015,7:28 AM

## 2015-10-07 NOTE — Progress Notes (Signed)
Discharged with instructions and prescriptions. Voiced understanding. Discharged via wheelchair accompanied by husband and volunteer.

## 2015-10-07 NOTE — Care Management Important Message (Signed)
Important Message  Patient Details  Name: Stephanie Velasquez MRN: [id number] Date of Birth: Mar 04, 1956   Medicare Important Message Given:  Yes-third notification given    Nathen May 10/07/2015, 12:35 PM

## 2015-10-07 NOTE — Progress Notes (Signed)
Patient sitting up in chair, pain medication given. Call light within reach.

## 2015-10-08 DIAGNOSIS — Z48812 Encounter for surgical aftercare following surgery on the circulatory system: Secondary | ICD-10-CM | POA: Diagnosis not present

## 2015-10-08 DIAGNOSIS — F419 Anxiety disorder, unspecified: Secondary | ICD-10-CM | POA: Diagnosis not present

## 2015-10-08 DIAGNOSIS — E7800 Pure hypercholesterolemia, unspecified: Secondary | ICD-10-CM | POA: Diagnosis not present

## 2015-10-08 DIAGNOSIS — Z954 Presence of other heart-valve replacement: Secondary | ICD-10-CM | POA: Diagnosis not present

## 2015-10-08 DIAGNOSIS — F17210 Nicotine dependence, cigarettes, uncomplicated: Secondary | ICD-10-CM | POA: Diagnosis not present

## 2015-10-08 DIAGNOSIS — I7300 Raynaud's syndrome without gangrene: Secondary | ICD-10-CM | POA: Diagnosis not present

## 2015-10-08 DIAGNOSIS — I10 Essential (primary) hypertension: Secondary | ICD-10-CM | POA: Diagnosis not present

## 2015-10-11 DIAGNOSIS — Z48812 Encounter for surgical aftercare following surgery on the circulatory system: Secondary | ICD-10-CM | POA: Diagnosis not present

## 2015-10-11 DIAGNOSIS — F17210 Nicotine dependence, cigarettes, uncomplicated: Secondary | ICD-10-CM | POA: Diagnosis not present

## 2015-10-11 DIAGNOSIS — I10 Essential (primary) hypertension: Secondary | ICD-10-CM | POA: Diagnosis not present

## 2015-10-11 DIAGNOSIS — I7300 Raynaud's syndrome without gangrene: Secondary | ICD-10-CM | POA: Diagnosis not present

## 2015-10-11 DIAGNOSIS — Z954 Presence of other heart-valve replacement: Secondary | ICD-10-CM | POA: Diagnosis not present

## 2015-10-11 DIAGNOSIS — E7800 Pure hypercholesterolemia, unspecified: Secondary | ICD-10-CM | POA: Diagnosis not present

## 2015-10-13 DIAGNOSIS — Z48812 Encounter for surgical aftercare following surgery on the circulatory system: Secondary | ICD-10-CM | POA: Diagnosis not present

## 2015-10-13 DIAGNOSIS — Z954 Presence of other heart-valve replacement: Secondary | ICD-10-CM | POA: Diagnosis not present

## 2015-10-13 DIAGNOSIS — F17210 Nicotine dependence, cigarettes, uncomplicated: Secondary | ICD-10-CM | POA: Diagnosis not present

## 2015-10-13 DIAGNOSIS — E7800 Pure hypercholesterolemia, unspecified: Secondary | ICD-10-CM | POA: Diagnosis not present

## 2015-10-13 DIAGNOSIS — I7300 Raynaud's syndrome without gangrene: Secondary | ICD-10-CM | POA: Diagnosis not present

## 2015-10-13 DIAGNOSIS — I10 Essential (primary) hypertension: Secondary | ICD-10-CM | POA: Diagnosis not present

## 2015-10-15 DIAGNOSIS — Z48812 Encounter for surgical aftercare following surgery on the circulatory system: Secondary | ICD-10-CM | POA: Diagnosis not present

## 2015-10-15 DIAGNOSIS — Z954 Presence of other heart-valve replacement: Secondary | ICD-10-CM | POA: Diagnosis not present

## 2015-10-15 DIAGNOSIS — I7300 Raynaud's syndrome without gangrene: Secondary | ICD-10-CM | POA: Diagnosis not present

## 2015-10-15 DIAGNOSIS — I10 Essential (primary) hypertension: Secondary | ICD-10-CM | POA: Diagnosis not present

## 2015-10-15 DIAGNOSIS — E7800 Pure hypercholesterolemia, unspecified: Secondary | ICD-10-CM | POA: Diagnosis not present

## 2015-10-15 DIAGNOSIS — F17210 Nicotine dependence, cigarettes, uncomplicated: Secondary | ICD-10-CM | POA: Diagnosis not present

## 2015-10-19 DIAGNOSIS — Z954 Presence of other heart-valve replacement: Secondary | ICD-10-CM | POA: Diagnosis not present

## 2015-10-19 DIAGNOSIS — E7800 Pure hypercholesterolemia, unspecified: Secondary | ICD-10-CM | POA: Diagnosis not present

## 2015-10-19 DIAGNOSIS — Z48812 Encounter for surgical aftercare following surgery on the circulatory system: Secondary | ICD-10-CM | POA: Diagnosis not present

## 2015-10-19 DIAGNOSIS — F17210 Nicotine dependence, cigarettes, uncomplicated: Secondary | ICD-10-CM | POA: Diagnosis not present

## 2015-10-19 DIAGNOSIS — I10 Essential (primary) hypertension: Secondary | ICD-10-CM | POA: Diagnosis not present

## 2015-10-19 DIAGNOSIS — I7300 Raynaud's syndrome without gangrene: Secondary | ICD-10-CM | POA: Diagnosis not present

## 2015-10-20 ENCOUNTER — Encounter: Payer: Medicare Other | Admitting: Physician Assistant

## 2015-11-02 ENCOUNTER — Telehealth: Payer: Self-pay | Admitting: *Deleted

## 2015-11-02 ENCOUNTER — Other Ambulatory Visit: Payer: Self-pay | Admitting: Cardiothoracic Surgery

## 2015-11-02 ENCOUNTER — Encounter: Payer: Self-pay | Admitting: *Deleted

## 2015-11-02 DIAGNOSIS — Q231 Congenital insufficiency of aortic valve: Principal | ICD-10-CM

## 2015-11-02 DIAGNOSIS — Q230 Congenital stenosis of aortic valve: Secondary | ICD-10-CM

## 2015-11-02 NOTE — Telephone Encounter (Signed)
Called patient to get family status.

## 2015-11-03 ENCOUNTER — Ambulatory Visit (INDEPENDENT_AMBULATORY_CARE_PROVIDER_SITE_OTHER): Payer: Self-pay | Admitting: Cardiothoracic Surgery

## 2015-11-03 ENCOUNTER — Ambulatory Visit
Admission: RE | Admit: 2015-11-03 | Discharge: 2015-11-03 | Disposition: A | Discharge status: Home / Self Care | Payer: Medicare Other | Source: Ambulatory Visit | Attending: Cardiothoracic Surgery | Admitting: Cardiothoracic Surgery

## 2015-11-03 ENCOUNTER — Encounter: Payer: Self-pay | Admitting: Cardiothoracic Surgery

## 2015-11-03 VITALS — BP 108/75 | HR 74 | Resp 20 | Ht 61.0 in | Wt 98.0 lb

## 2015-11-03 DIAGNOSIS — Q230 Congenital stenosis of aortic valve: Secondary | ICD-10-CM

## 2015-11-03 DIAGNOSIS — Z952 Presence of prosthetic heart valve: Secondary | ICD-10-CM

## 2015-11-03 DIAGNOSIS — J90 Pleural effusion, not elsewhere classified: Secondary | ICD-10-CM | POA: Diagnosis not present

## 2015-11-03 DIAGNOSIS — Z954 Presence of other heart-valve replacement: Secondary | ICD-10-CM

## 2015-11-03 DIAGNOSIS — Q231 Congenital insufficiency of aortic valve: Principal | ICD-10-CM

## 2015-11-03 DIAGNOSIS — I350 Nonrheumatic aortic (valve) stenosis: Secondary | ICD-10-CM

## 2015-11-03 NOTE — Progress Notes (Signed)
PCP is Cyndi Bender, PA-C Referring Provider is Bensimhon, Shaune Pascal, MD  Chief Complaint  Patient presents with  . Routine Post Op    f/u from surgery with CXR s/p AVR 09/30/15    HPI: One month followup after aortic valve replacement with a pericardial tissue valve 23 mm magna ease for severe aortic stenosis Patient has severe COPD from smoking and was sent home on home oxygen which is now not using. She's done well without symptoms of angina or CHF. The surgical incisions healing well. She is maintained sinus rhythm. She denies cough or fever.  Chest x-ray today shows a moderate right pleural effusion increased from her discharge x-ray. This is probably multifactorial- postop effusion plus poor nutritional status and low protein plus underlying COPD.she also has fairly severe scoliosis and deformity of her right thorax and right shoulder. The patient has stopped smoking for which she was commended.  Past Medical History  Diagnosis Date  . Hypertension   . Aortic valve disease   . Migraine   . Hypercholesterolemia     Simvastatin  . Anxiety and depression   . Low back pain   . Iron deficiency anemia   . Osteoarthritis     hands  . Breast cancer [facility])     Dr Bobby Rumpf Dr Rogelia Boga, right side 2011  . Raynaud's syndrome   . Diverticulitis   . Stomach ulcer   . Anxiety   . Depression   . GERD (gastroesophageal reflux disease)     Nexium  . Wears glasses   . Overactive bladder   . Constipation   . Heart murmur   . History of bronchitis   . Hearing difficulty of left ear   . Paralysis of right upper extremity ([place])   . Right arm numbness   . PONV (postoperative nausea and vomiting)     pt. states that she was very sick    Past Surgical History  Procedure Laterality Date  . Cesarean section      x 2  . Tubal ligation    . Cardiac catheterization N/A 09/03/2015    Procedure: Right/Left Heart Cath and Coronary Angiography;  Surgeon: Jolaine Artist, MD;  Location: [place] CV LAB;  Service: Cardiovascular;  Laterality: N/A;  . Breast lumpectomy with axillary lymph node biopsy Right 2011  . Breast surgery  2011, 2014    right side x 2  . Colonoscopy  2014  . Esophagogastroduodenoscopy    . Tonsillectomy    . Aortic valve replacement N/A 09/30/2015    Procedure: AORTIC VALVE REPLACEMENT (AVR);  Surgeon: Ivin Poot, MD;  Location: [place];  Service: Open Heart Surgery;  Laterality: N/A;  . Tee without cardioversion N/A 09/30/2015    Procedure: TRANSESOPHAGEAL ECHOCARDIOGRAM (TEE);  Surgeon: Ivin Poot, MD;  Location: [place];  Service: Open Heart Surgery;  Laterality: N/A;    Family History  Problem Relation Age of Onset  . COPD Father   . Heart disease Father   . Hyperlipidemia Father   . Hypertension Father   . Hypertension Mother   . Heart disease Sister   . Hyperlipidemia Sister   . Hypertension Sister   . COPD Brother   . Heart disease Brother   . Hyperlipidemia Brother   . Hypertension Brother     Social History Social History  Substance Use Topics  . Smoking status: Current Every Day Smoker -- 0.50 packs/day    Types: Cigarettes  . Smokeless tobacco: None  .  Alcohol Use: No    Current Outpatient Prescriptions  Medication Sig Dispense Refill  . aspirin EC 325 MG EC tablet Take 1 tablet (325 mg total) by mouth daily. 30 tablet 0  . Aspirin-Acetaminophen-Caffeine (GOODY HEADACHE PO) Take 1 each by mouth 2 (two) times daily as needed (PAIN).    Marland Kitchen budesonide-formoterol (SYMBICORT) 160-4.5 MCG/ACT inhaler Inhale 2 puffs into the lungs 2 (two) times daily. 1 Inhaler 0  . citalopram (CELEXA) 20 MG tablet Take 20 mg by mouth daily.    Marland Kitchen esomeprazole (NEXIUM) 40 MG capsule Take 40 mg by mouth daily at 12 noon.    . ferrous sulfate 325 (65 FE) MG tablet Take 325 mg by mouth daily with breakfast.    . metoprolol tartrate (LOPRESSOR) 25 MG tablet Take 0.5 tablets (12.5 mg total) by mouth 2 (two) times daily. 30 tablet 1  . Multiple  Vitamins-Minerals (EMERGEN-C IMMUNE PO) Take 1 tablet by mouth daily.    . OxyCODONE (OXYCONTIN) 20 mg T12A 12 hr tablet Take 20 mg by mouth every 12 (twelve) hours.    Marland Kitchen oxyCODONE-acetaminophen (PERCOCET) 7.5-325 MG per tablet Take 1 tablet by mouth every 8 (eight) hours as needed for severe pain.    . promethazine (PHENERGAN) 25 MG tablet Take 25 mg by mouth every 6 (six) hours as needed for nausea or vomiting.    . ranitidine (ZANTAC) 150 MG tablet Take 150 mg by mouth 2 (two) times daily.    . simvastatin (ZOCOR) 40 MG tablet Take 40 mg by mouth daily.    . solifenacin (VESICARE) 5 MG tablet Take 5 mg by mouth daily.    . SUMAtriptan (IMITREX) 100 MG tablet Take 100 mg by mouth every 2 (two) hours as needed for migraine. May repeat in 2 hours if headache persists or recurs.    . traMADol (ULTRAM) 50 MG tablet Take 1 tablet (50 mg total) by mouth every 6 (six) hours as needed for moderate pain. (Patient not taking: Reported on 11/03/2015) 30 tablet 0   No current facility-administered medications for this visit.    No Known Allergies  Review of Systems  Appetite improving Sleep habits improving Overall strength is improving No or ankle edema  BP 108/75 mmHg  Pulse 74  Resp 20  Ht 5\' 1"  (1.549 m)  Wt 98 lb (44.453 kg)  BMI 18.53 kg/m2  SpO2 96% Physical Exam Diminished breath sounds right base Normal heart valve closure sounds, normal heart rhythm Sternal incision well-healed and stable Abdomen soft Extremities warm  Diagnostic Tests: Chest x-ray with right sided moderate pleural effusion Sternal wires intact, cardiac silhouette stable  Impression: Doing well status post aVR for severe AS. She can resume driving in light household activities She'll be given a weeks course of oral Lasix 40 to treat the pleural effusion. She is recommended to start treating and short twice a day. We will remove her home oxygen.  Plan: Return with chest x-ray in 2 weeks after course of  oral Lasix for the postop right pleural effusion  Len Childs, MD [facility] [phone]

## 2015-11-04 NOTE — Progress Notes (Signed)
Cardiology Office Note   Date:  11/05/2015   ID:  Stephanie Velasquez, DOB 1956-05-27, MRN [id number]   Patient Care Team: Cyndi Bender, PA-C as PCP - General (Physician Assistant) Sherren Mocha, MD as Consulting Physician (Cardiology) Ivin Poot, MD as Consulting Physician (Cardiothoracic Surgery)    Chief Complaint  Patient presents with  . NEW PATIENT  . Hospitalization Follow-up    s/p AVR     History of Present Illness: Stephanie Velasquez is a 59 y.o. female with a hx of bicuspid aortic valve with associated aortic stenosis.  Other hx includes spinal stenosis, PUD, HTN, HL, iron deficiency anemia, COPD, tobacco abuse.  She saw her PCP in 8/16 and FU echo demonstrated severe AS with mean gradient 50 mmHg.  She was referred to Dr. Prescott Gum.  Cardiac cath was arranged and demonstrated normal coronary arteries.    She was admitted 10/13-10/20 and underwent bioprosthetic AVR. She remained in normal sinus rhythm postoperatively. She did require transfusion with PRBCs secondary to blood loss anemia. She had oxygen desaturation and was discharged on home O2.  She presents to our office today to establish with Cardiology.  Doing well since her AVR. Chest soreness is minimal. She denies significant dyspnea. Denies orthopnea or PND. Denies edema. Denies syncope. Denies fevers or chills. She notes that she is trying to determine if she can go to cardiac rehabilitation.  She was noted to have a R effusion on CXR this week with Dr. Prescott Gum. She was placed on Lasix x 10 days.    Studies/Reports Reviewed Today:  Carotid US 09/24/15 Bilateral 1-39%  LHC 09/03/15 1. Normal coronaries with very large RCA  2. Normal pressures 3. Mildly depressed cardiac output in the setting of severe AS  Echo 07/28/15 Photocopy quality is poor and cannot be read   Past Medical History  Diagnosis Date  . Hypertension   . Aortic valve disease     s/p AVR 11/16  . Migraine   .  Hypercholesterolemia     Simvastatin  . Anxiety and depression   . Spinal stenosis   . Iron deficiency anemia   . Osteoarthritis     hands  . Breast cancer [facility])     Dr Bobby Rumpf Dr Rogelia Boga, right side 2011  . Raynaud's syndrome   . Diverticulitis   . Stomach ulcer   . GERD (gastroesophageal reflux disease)     Nexium  . Overactive bladder   . Constipation   . History of bronchitis   . Hearing difficulty of left ear   . Paralysis of right upper extremity [facility])     Past Surgical History  Procedure Laterality Date  . Cesarean section      x 2  . Tubal ligation    . Cardiac catheterization N/A 09/03/2015    Procedure: Right/Left Heart Cath and Coronary Angiography;  Surgeon: Jolaine Artist, MD;  Location: [place] CV LAB;  Service: Cardiovascular;  Laterality: N/A;  . Breast lumpectomy with axillary lymph node biopsy Right 2011  . Breast surgery  2011, 2014    right side x 2  . Colonoscopy  2014  . Esophagogastroduodenoscopy    . Tonsillectomy    . Aortic valve replacement N/A 09/30/2015    Procedure: AORTIC VALVE REPLACEMENT (AVR);  Surgeon: Ivin Poot, MD;  Location: [place];  Service: Open Heart Surgery;  Laterality: N/A;  . Tee without cardioversion N/A 09/30/2015    Procedure: TRANSESOPHAGEAL ECHOCARDIOGRAM (TEE);  Surgeon: Tharon Aquas Trigt,  MD;  Location[facility];  Service: Open Heart Surgery;  Laterality: N/A;  . Trigger finger release    . Appendectomy       Current Outpatient Prescriptions  Medication Sig Dispense Refill  . aspirin 81 MG EC tablet Take 1 tablet (81 mg total) by mouth daily.    . Aspirin-Acetaminophen-Caffeine (GOODY HEADACHE PO) Take 1 each by mouth 2 (two) times daily as needed (PAIN).    . citalopram (CELEXA) 20 MG tablet Take 20 mg by mouth daily.    Marland Kitchen esomeprazole (NEXIUM) 40 MG capsule Take 40 mg by mouth daily at 12 noon.    . Furosemide (LASIX PO) Take 1 tablet by mouth daily. Patient takes 1 tablet by mouth once daily. PATIENT NOT  SURE OF DOSAGE.    . metoprolol tartrate (LOPRESSOR) 25 MG tablet Take 0.5 tablets (12.5 mg total) by mouth 2 (two) times daily. 30 tablet 11  . Multiple Vitamins-Minerals (EMERGEN-C IMMUNE PO) Take 1 tablet by mouth daily.    . OxyCODONE (OXYCONTIN) 20 mg T12A 12 hr tablet Take 20 mg by mouth every 12 (twelve) hours.    Marland Kitchen oxyCODONE-acetaminophen (PERCOCET) 7.5-325 MG per tablet Take 1 tablet by mouth every 8 (eight) hours as needed for severe pain.    . promethazine (PHENERGAN) 25 MG tablet Take 25 mg by mouth every 6 (six) hours as needed for nausea or vomiting.    . ranitidine (ZANTAC) 150 MG tablet Take 150 mg by mouth daily.     . solifenacin (VESICARE) 5 MG tablet Take 5 mg by mouth daily.    . SUMAtriptan (IMITREX) 100 MG tablet Take 100 mg by mouth every 2 (two) hours as needed for migraine. May repeat in 2 hours if headache persists or recurs.    . traMADol (ULTRAM) 50 MG tablet Take 1 tablet (50 mg total) by mouth every 6 (six) hours as needed for moderate pain. 30 tablet 0   No current facility-administered medications for this visit.    Allergies:   Review of patient's allergies indicates no known allergies.    Social History:   Social History   Social History  . Marital Status: Married    Spouse Name: N/A  . Number of Children: 2  . Years of Education: N/A   Occupational History  . disabled    Social History Main Topics  . Smoking status: Former Smoker -- 0.50 packs/day    Types: Cigarettes    Quit date: 09/30/2015  . Smokeless tobacco: None  . Alcohol Use: No  . Drug Use: No  . Sexual Activity: Not Asked   Other Topics Concern  . None   Social History Narrative     Family History:   Family History  Problem Relation Age of Onset  . COPD Father   . Heart disease Father   . Hyperlipidemia Father   . Hypertension Father   . Hypertension Mother   . Heart disease Sister   . Hyperlipidemia Sister   . Hypertension Sister   . COPD Brother   . Heart  disease Brother   . Hyperlipidemia Brother   . Hypertension Brother       ROS:   Please see the history of present illness.   Review of Systems  All other systems reviewed and are negative.     PHYSICAL EXAM: VS:  BP 104/62 mmHg  Pulse 68  Ht 5\' 1"  (1.549 m)  Wt 92 lb (41.731 kg)  BMI 17.39 kg/m2  Wt Readings from Last 3 Encounters:  11/05/15 92 lb (41.731 kg)  11/03/15 98 lb (44.453 kg)  10/07/15 99 lb 10.4 oz (45.2 kg)     GEN: Thin, frail female, in no acute distress HEENT: normal Neck: no JVD, no carotid bruits, no masses Cardiac:  Normal 99991111, RRR; 1/6 systolic murmur along the LSB,  no rubs or gallops, no edema   Respiratory:  Decreased breath sounds at the bases, especially on the right, no wheezing or rales GI: soft, nontender, nondistended, + BS MS: no deformity or atrophy Skin: warm and dry  Neuro:  CNs II-XII intact, Strength and sensation are intact Psych: Normal affect   EKG:  EKG is ordered today.  It demonstrates:   NSR, HR 68, normal axis, T-wave inversions 2, 3, aVF, V3-V5, LVH, QTc 423 ms   Recent Labs: 09/24/2015: ALT 21 10/01/2015: Magnesium 1.6* 10/04/2015: BUN 9; Creatinine, Ser 0.41*; Potassium 4.3; Sodium 135 10/05/2015: Hemoglobin 9.4*; Platelets 196    Lipid Panel No results found for: CHOL, TRIG, HDL, CHOLHDL, VLDL, LDLCALC, LDLDIRECT    ASSESSMENT AND PLAN:  1. Aortic Valve Disease: History of bicuspid aortic valve now status post bioprosthetic AVR.  She is progressing well.  We discussed the importance of SBE prophylaxis.  Continue Metoprolol for now.  Consider DC at next visit in 3 mos.  Arrange FU echo.  If she cannot attend cardiac rehab, I asked her to continue walking on her own.    2. Pleural Effusion:  She is on Lasix for 10 days.  She will FU with Dr. Prescott Gum with a repeat CXR in 2 weeks.   3. HTN:  Controlled.  4. Hyperlipidemia:  Managed by PCP.   5. COPD:  She has quit smoking.       Medication  Changes: Current medicines are reviewed at length with the patient today.  Concerns regarding medicines are as outlined above.  The following changes have been made:   Discontinued Medications   BUDESONIDE-FORMOTEROL (SYMBICORT) 160-4.5 MCG/ACT INHALER    Inhale 2 puffs into the lungs 2 (two) times daily.   FERROUS SULFATE 325 (65 FE) MG TABLET    Take 325 mg by mouth daily with breakfast.   SIMVASTATIN (ZOCOR) 40 MG TABLET    Take 40 mg by mouth daily.   Modified Medications   Modified Medication Previous Medication   ASPIRIN 81 MG EC TABLET aspirin EC 325 MG EC tablet      Take 1 tablet (81 mg total) by mouth daily.    Take 1 tablet (325 mg total) by mouth daily.   METOPROLOL TARTRATE (LOPRESSOR) 25 MG TABLET metoprolol tartrate (LOPRESSOR) 25 MG tablet      Take 0.5 tablets (12.5 mg total) by mouth 2 (two) times daily.    Take 0.5 tablets (12.5 mg total) by mouth 2 (two) times daily.   New Prescriptions   No medications on file   Labs/ tests ordered today include:   Orders Placed This Encounter  Procedures  . EKG 12-Lead  . Echocardiogram     Disposition:    I will establish her with Dr. Sherren Mocha. FU with me in 3 mos and Dr. Sherren Mocha in 9 mos.    Signed, Versie Starks, MHS 11/05/2015 11:52 AM    [facility] [place], [place], [place]  [address] Phone: [phone]; Fax: [phone]

## 2015-11-05 ENCOUNTER — Ambulatory Visit (INDEPENDENT_AMBULATORY_CARE_PROVIDER_SITE_OTHER): Payer: Medicare Other | Admitting: Physician Assistant

## 2015-11-05 ENCOUNTER — Encounter: Payer: Self-pay | Admitting: Physician Assistant

## 2015-11-05 VITALS — BP 104/62 | HR 68 | Ht 61.0 in | Wt 92.0 lb

## 2015-11-05 DIAGNOSIS — E785 Hyperlipidemia, unspecified: Secondary | ICD-10-CM | POA: Diagnosis not present

## 2015-11-05 DIAGNOSIS — Z954 Presence of other heart-valve replacement: Secondary | ICD-10-CM | POA: Diagnosis not present

## 2015-11-05 DIAGNOSIS — I350 Nonrheumatic aortic (valve) stenosis: Secondary | ICD-10-CM

## 2015-11-05 DIAGNOSIS — Z952 Presence of prosthetic heart valve: Secondary | ICD-10-CM

## 2015-11-05 DIAGNOSIS — I10 Essential (primary) hypertension: Secondary | ICD-10-CM | POA: Diagnosis not present

## 2015-11-05 DIAGNOSIS — J90 Pleural effusion, not elsewhere classified: Secondary | ICD-10-CM

## 2015-11-05 MED ORDER — METOPROLOL TARTRATE 25 MG PO TABS: 12.5000 mg | ORAL_TABLET | Freq: Two times a day (BID) | ORAL | Status: DC

## 2015-11-05 MED ORDER — ASPIRIN 81 MG PO TBEC: 81.0000 mg | DELAYED_RELEASE_TABLET | Freq: Every day | ORAL | Status: AC

## 2015-11-05 NOTE — Patient Instructions (Signed)
Medication Instructions:  1. A REFILL FOR METOPROLOL WAS SENT IN  2. YOUR MEDICATION LIST HAS BEEN CHANGED TO RELFECT ASPIRIN 34 MG DAILY  Labwork: NONE  Testing/Procedures: Your physician has requested that you have an echocardiogram. Echocardiography is a painless test that uses sound waves to create images of your heart. It provides your doctor with information about the size and shape of your heart and how well your heart's chambers and valves are working. This procedure takes approximately one hour. There are no restrictions for this procedure.   Follow-Up: 1. 3 MONTHS WITH SCOTT WEAVER, PAC   2. 9 MONTHS WITH DR. Burt Knack; OUR OFFICE WILL CALL YOU A COUPLE OF MONTHS EARLIER TO MAKE APPT  Any Other Special Instructions Will Be Listed Below (If Applicable).   If you need a refill on your cardiac medications before your next appointment, please call your pharmacy.

## 2015-11-09 DIAGNOSIS — F17210 Nicotine dependence, cigarettes, uncomplicated: Secondary | ICD-10-CM | POA: Diagnosis not present

## 2015-11-09 DIAGNOSIS — Z954 Presence of other heart-valve replacement: Secondary | ICD-10-CM | POA: Diagnosis not present

## 2015-11-09 DIAGNOSIS — Z48812 Encounter for surgical aftercare following surgery on the circulatory system: Secondary | ICD-10-CM | POA: Diagnosis not present

## 2015-11-09 DIAGNOSIS — E7800 Pure hypercholesterolemia, unspecified: Secondary | ICD-10-CM | POA: Diagnosis not present

## 2015-11-09 DIAGNOSIS — I7300 Raynaud's syndrome without gangrene: Secondary | ICD-10-CM | POA: Diagnosis not present

## 2015-11-09 DIAGNOSIS — I10 Essential (primary) hypertension: Secondary | ICD-10-CM | POA: Diagnosis not present

## 2015-11-15 ENCOUNTER — Encounter: Payer: Self-pay | Admitting: Physician Assistant

## 2015-11-16 ENCOUNTER — Other Ambulatory Visit: Payer: Self-pay

## 2015-11-16 ENCOUNTER — Ambulatory Visit (HOSPITAL_COMMUNITY): Payer: Medicare Other | Attending: Physician Assistant

## 2015-11-16 DIAGNOSIS — I10 Essential (primary) hypertension: Secondary | ICD-10-CM | POA: Diagnosis not present

## 2015-11-16 DIAGNOSIS — Z952 Presence of prosthetic heart valve: Secondary | ICD-10-CM

## 2015-11-16 DIAGNOSIS — Z954 Presence of other heart-valve replacement: Secondary | ICD-10-CM | POA: Insufficient documentation

## 2015-11-16 DIAGNOSIS — I313 Pericardial effusion (noninflammatory): Secondary | ICD-10-CM | POA: Insufficient documentation

## 2015-11-16 DIAGNOSIS — I351 Nonrheumatic aortic (valve) insufficiency: Secondary | ICD-10-CM | POA: Insufficient documentation

## 2015-11-16 DIAGNOSIS — I359 Nonrheumatic aortic valve disorder, unspecified: Secondary | ICD-10-CM | POA: Diagnosis present

## 2015-11-16 DIAGNOSIS — I517 Cardiomegaly: Secondary | ICD-10-CM | POA: Insufficient documentation

## 2015-11-17 ENCOUNTER — Telehealth: Payer: Self-pay | Admitting: *Deleted

## 2015-11-17 ENCOUNTER — Encounter: Payer: Self-pay | Admitting: Physician Assistant

## 2015-11-17 NOTE — Telephone Encounter (Signed)
Pt notified of echo results by phone with verbal understanding 

## 2015-11-18 ENCOUNTER — Encounter: Payer: Self-pay | Admitting: Cardiothoracic Surgery

## 2015-11-18 ENCOUNTER — Ambulatory Visit (INDEPENDENT_AMBULATORY_CARE_PROVIDER_SITE_OTHER): Payer: Self-pay | Admitting: Cardiothoracic Surgery

## 2015-11-18 ENCOUNTER — Ambulatory Visit
Admission: RE | Admit: 2015-11-18 | Discharge: 2015-11-18 | Disposition: A | Discharge status: Home / Self Care | Payer: Medicare Other | Source: Ambulatory Visit | Attending: Cardiothoracic Surgery | Admitting: Cardiothoracic Surgery

## 2015-11-18 ENCOUNTER — Other Ambulatory Visit: Payer: Self-pay | Admitting: Cardiothoracic Surgery

## 2015-11-18 VITALS — BP 125/77 | HR 86 | Resp 20 | Ht 61.0 in | Wt 98.0 lb

## 2015-11-18 DIAGNOSIS — J90 Pleural effusion, not elsewhere classified: Secondary | ICD-10-CM | POA: Diagnosis not present

## 2015-11-18 DIAGNOSIS — I350 Nonrheumatic aortic (valve) stenosis: Secondary | ICD-10-CM

## 2015-11-18 DIAGNOSIS — Z954 Presence of other heart-valve replacement: Secondary | ICD-10-CM

## 2015-11-18 DIAGNOSIS — J948 Other specified pleural conditions: Secondary | ICD-10-CM

## 2015-11-18 DIAGNOSIS — Z952 Presence of prosthetic heart valve: Secondary | ICD-10-CM

## 2015-11-19 DIAGNOSIS — M5136 Other intervertebral disc degeneration, lumbar region: Secondary | ICD-10-CM | POA: Diagnosis not present

## 2015-11-19 DIAGNOSIS — M5416 Radiculopathy, lumbar region: Secondary | ICD-10-CM | POA: Diagnosis not present

## 2015-11-19 DIAGNOSIS — M4120 Other idiopathic scoliosis, site unspecified: Secondary | ICD-10-CM | POA: Diagnosis not present

## 2015-11-19 DIAGNOSIS — M545 Low back pain: Secondary | ICD-10-CM | POA: Diagnosis not present

## 2015-11-19 DIAGNOSIS — M4806 Spinal stenosis, lumbar region: Secondary | ICD-10-CM | POA: Diagnosis not present

## 2015-11-19 DIAGNOSIS — M62838 Other muscle spasm: Secondary | ICD-10-CM | POA: Diagnosis not present

## 2015-11-19 NOTE — Progress Notes (Signed)
PCP is Cyndi Bender, PA-C Referring Provider is Bensimhon, Shaune Pascal, MD  Chief Complaint  Patient presents with  . Routine Post Op    2 week f/u with CXR    HPI: The patient returns for follow-up after undergoing aortic valve replacement with a tissue valve for severe AS. She has developed a postoperative right pleural effusion which has been treated with oral diuretic, smoking cessation, and improved diet for protein malnutrition. She is off oxygen. Her shortness of breath is getting better.  Chest x-ray done today is personally reviewed and shows improvement in the right pleural effusion. Cardiac silhouette remained stable. She is currently off her Lasix.  Past Medical History  Diagnosis Date  . Hypertension   . Aortic valve disease     a. s/p AVR 11/16;  b. Echo 11/16: mild LVH, vigorous LVF, no RWMA, bioprosthetic AVR ok withmean 13 mmHg and trivial AI, trivial effusion  . Migraine   . Hypercholesterolemia     Simvastatin  . Anxiety and depression   . Spinal stenosis   . Iron deficiency anemia   . Osteoarthritis     hands  . Breast cancer [facility])     Dr Bobby Rumpf Dr Rogelia Boga, right side 2011  . Raynaud's syndrome   . Diverticulitis   . Stomach ulcer   . GERD (gastroesophageal reflux disease)     Nexium  . Overactive bladder   . Constipation   . History of bronchitis   . Hearing difficulty of left ear   . Paralysis of right upper extremity [facility])     Past Surgical History  Procedure Laterality Date  . Cesarean section      x 2  . Tubal ligation    . Cardiac catheterization N/A 09/03/2015    Procedure: Right/Left Heart Cath and Coronary Angiography;  Surgeon: Jolaine Artist, MD;  Location: [place] [facility];  Service: Cardiovascular;  Laterality: N/A;  . Breast lumpectomy with axillary lymph node biopsy Right 2011  . Breast surgery  2011, 2014    right side x 2  . Colonoscopy  2014  . Esophagogastroduodenoscopy    . Tonsillectomy    . Aortic valve replacement  N/A 09/30/2015    Procedure: AORTIC VALVE REPLACEMENT (AVR);  Surgeon: Ivin Poot, MD;  Location: [place];  Service: Open Heart Surgery;  Laterality: N/A;  . Tee without cardioversion N/A 09/30/2015    Procedure: TRANSESOPHAGEAL ECHOCARDIOGRAM (TEE);  Surgeon: Ivin Poot, MD;  Location: [place];  Service: Open Heart Surgery;  Laterality: N/A;  . Trigger finger release    . Appendectomy      Family History  Problem Relation Age of Onset  . COPD Father   . Heart disease Father   . Hyperlipidemia Father   . Hypertension Father   . Hypertension Mother   . Heart disease Sister   . Hyperlipidemia Sister   . Hypertension Sister   . COPD Brother   . Heart disease Brother   . Hyperlipidemia Brother   . Hypertension Brother     Social History Social History  Substance Use Topics  . Smoking status: Former Smoker -- 0.50 packs/day    Types: Cigarettes    Quit date: 09/30/2015  . Smokeless tobacco: None  . Alcohol Use: No    Current Outpatient Prescriptions  Medication Sig Dispense Refill  . aspirin 81 MG EC tablet Take 1 tablet (81 mg total) by mouth daily.    . Aspirin-Acetaminophen-Caffeine (GOODY HEADACHE PO) Take 1 each by  mouth 2 (two) times daily as needed (PAIN).    . citalopram (CELEXA) 20 MG tablet Take 20 mg by mouth daily.    Marland Kitchen esomeprazole (NEXIUM) 40 MG capsule Take 40 mg by mouth daily at 12 noon.    . metoprolol tartrate (LOPRESSOR) 25 MG tablet Take 0.5 tablets (12.5 mg total) by mouth 2 (two) times daily. 30 tablet 11  . Multiple Vitamins-Minerals (EMERGEN-C IMMUNE PO) Take 1 tablet by mouth daily.    . OxyCODONE (OXYCONTIN) 20 mg T12A 12 hr tablet Take 20 mg by mouth every 12 (twelve) hours.    Marland Kitchen oxyCODONE-acetaminophen (PERCOCET) 7.5-325 MG per tablet Take 1 tablet by mouth every 8 (eight) hours as needed for severe pain.    . promethazine (PHENERGAN) 25 MG tablet Take 25 mg by mouth every 6 (six) hours as needed for nausea or vomiting.    . ranitidine  (ZANTAC) 150 MG tablet Take 150 mg by mouth daily.     . solifenacin (VESICARE) 5 MG tablet Take 5 mg by mouth daily.    . SUMAtriptan (IMITREX) 100 MG tablet Take 100 mg by mouth every 2 (two) hours as needed for migraine. May repeat in 2 hours if headache persists or recurs.    . traMADol (ULTRAM) 50 MG tablet Take 1 tablet (50 mg total) by mouth every 6 (six) hours as needed for moderate pain. 30 tablet 0   No current facility-administered medications for this visit.    No Known Allergies  Review of Systems  No fever or productive cough She is not smoking Appetite is improved Ankle edema is resolved  BP 125/77 mmHg  Pulse 86  Resp 20  Ht 5\' 1"  (1.549 m)  Wt 98 lb (44.453 kg)  BMI 18.53 kg/m2  SpO2 91% Physical Exam Alert and comfortable Lungs clear but with some diminished breath sounds at right base Heart rate regular, no murmur through the aortic valve prosthesis Neuro intact with baseline right upper extremity weakness and deformity  Diagnostic Tests: Chest x-ray done today personally reviewed shows improvement in the right pleural effusion  Patient had a recent echocardiogram which shows normal LV function and normal functioning of her bioprosthetic valve. No pericardial effusion.  Impression: Diffusion is improved but not resolved She will take Lasix 20 mg daily for 10 days. She'll return for chest x-ray after the holidays. She is encouraged to continue smoking cessation.   Plan: Return with chest x-ray in 6 weeks to assess right pleural effusion. She notes she can start driving and doing more activities following her AVR.  Len Childs, MD [facility] [phone]

## 2015-11-21 ENCOUNTER — Other Ambulatory Visit: Payer: Self-pay | Admitting: Cardiothoracic Surgery

## 2015-12-01 DIAGNOSIS — I7300 Raynaud's syndrome without gangrene: Secondary | ICD-10-CM | POA: Diagnosis not present

## 2015-12-01 DIAGNOSIS — Z48812 Encounter for surgical aftercare following surgery on the circulatory system: Secondary | ICD-10-CM | POA: Diagnosis not present

## 2015-12-01 DIAGNOSIS — F17210 Nicotine dependence, cigarettes, uncomplicated: Secondary | ICD-10-CM | POA: Diagnosis not present

## 2015-12-01 DIAGNOSIS — I10 Essential (primary) hypertension: Secondary | ICD-10-CM | POA: Diagnosis not present

## 2015-12-01 DIAGNOSIS — E7800 Pure hypercholesterolemia, unspecified: Secondary | ICD-10-CM | POA: Diagnosis not present

## 2015-12-01 DIAGNOSIS — Z954 Presence of other heart-valve replacement: Secondary | ICD-10-CM | POA: Diagnosis not present

## 2015-12-27 ENCOUNTER — Other Ambulatory Visit: Payer: Self-pay | Admitting: Cardiothoracic Surgery

## 2015-12-27 DIAGNOSIS — I350 Nonrheumatic aortic (valve) stenosis: Secondary | ICD-10-CM

## 2015-12-29 ENCOUNTER — Encounter: Payer: Self-pay | Admitting: Cardiothoracic Surgery

## 2015-12-29 ENCOUNTER — Ambulatory Visit (INDEPENDENT_AMBULATORY_CARE_PROVIDER_SITE_OTHER): Payer: Self-pay | Admitting: Cardiothoracic Surgery

## 2015-12-29 ENCOUNTER — Ambulatory Visit
Admission: RE | Admit: 2015-12-29 | Discharge: 2015-12-29 | Disposition: A | Discharge status: Home / Self Care | Payer: Medicare Other | Source: Ambulatory Visit | Attending: Cardiothoracic Surgery | Admitting: Cardiothoracic Surgery

## 2015-12-29 VITALS — BP 114/68 | HR 72 | Resp 20 | Ht 61.0 in | Wt 98.0 lb

## 2015-12-29 DIAGNOSIS — J948 Other specified pleural conditions: Secondary | ICD-10-CM

## 2015-12-29 DIAGNOSIS — Z954 Presence of other heart-valve replacement: Secondary | ICD-10-CM

## 2015-12-29 DIAGNOSIS — J90 Pleural effusion, not elsewhere classified: Secondary | ICD-10-CM | POA: Diagnosis not present

## 2015-12-29 DIAGNOSIS — Z952 Presence of prosthetic heart valve: Secondary | ICD-10-CM

## 2015-12-29 DIAGNOSIS — I350 Nonrheumatic aortic (valve) stenosis: Secondary | ICD-10-CM

## 2015-12-30 NOTE — Progress Notes (Signed)
PCP is Cyndi Bender, PA-C Referring Provider is Bensimhon, Shaune Pascal, MD  Chief Complaint  Patient presents with  . Routine Post Op    6 week f/u right pleural effusion HX AVR    HPI:the patient had a aVR almost 3 months ago and returns for followup chest x-ray to check the status of a postoperative right pleural effusion.  She has no symptoms. Chest x-ray today shows resolution right pleural effusion. Her diet has improved and she is eating more protein now. She is not smoking.   Past Medical History  Diagnosis Date  . Hypertension   . Aortic valve disease     a. s/p AVR 11/16;  b. Echo 11/16: mild LVH, vigorous LVF, no RWMA, bioprosthetic AVR ok withmean 13 mmHg and trivial AI, trivial effusion  . Migraine   . Hypercholesterolemia     Simvastatin  . Anxiety and depression   . Spinal stenosis   . Iron deficiency anemia   . Osteoarthritis     hands  . Breast cancer [facility])     Dr Bobby Rumpf Dr Rogelia Boga, right side 2011  . Raynaud's syndrome   . Diverticulitis   . Stomach ulcer   . GERD (gastroesophageal reflux disease)     Nexium  . Overactive bladder   . Constipation   . History of bronchitis   . Hearing difficulty of left ear   . Paralysis of right upper extremity [facility])     Past Surgical History  Procedure Laterality Date  . Cesarean section      x 2  . Tubal ligation    . Cardiac catheterization N/A 09/03/2015    Procedure: Right/Left Heart Cath and Coronary Angiography;  Surgeon: Jolaine Artist, MD;  Location: [place] [facility];  Service: Cardiovascular;  Laterality: N/A;  . Breast lumpectomy with axillary lymph node biopsy Right 2011  . Breast surgery  2011, 2014    right side x 2  . Colonoscopy  2014  . Esophagogastroduodenoscopy    . Tonsillectomy    . Aortic valve replacement N/A 09/30/2015    Procedure: AORTIC VALVE REPLACEMENT (AVR);  Surgeon: Ivin Poot, MD;  Location: [place];  Service: Open Heart Surgery;  Laterality: N/A;  . Tee without  cardioversion N/A 09/30/2015    Procedure: TRANSESOPHAGEAL ECHOCARDIOGRAM (TEE);  Surgeon: Ivin Poot, MD;  Location: [place];  Service: Open Heart Surgery;  Laterality: N/A;  . Trigger finger release    . Appendectomy      Family History  Problem Relation Age of Onset  . COPD Father   . Heart disease Father   . Hyperlipidemia Father   . Hypertension Father   . Hypertension Mother   . Heart disease Sister   . Hyperlipidemia Sister   . Hypertension Sister   . COPD Brother   . Heart disease Brother   . Hyperlipidemia Brother   . Hypertension Brother     Social History Social History  Substance Use Topics  . Smoking status: Former Smoker -- 0.50 packs/day    Types: Cigarettes    Quit date: 09/30/2015  . Smokeless tobacco: None  . Alcohol Use: No    Current Outpatient Prescriptions  Medication Sig Dispense Refill  . aspirin 81 MG EC tablet Take 1 tablet (81 mg total) by mouth daily.    . Aspirin-Acetaminophen-Caffeine (GOODY HEADACHE PO) Take 1 each by mouth 2 (two) times daily as needed (PAIN).    . citalopram (CELEXA) 20 MG tablet Take 20 mg by  mouth daily.    Marland Kitchen esomeprazole (NEXIUM) 40 MG capsule Take 40 mg by mouth daily at 12 noon.    . metoprolol tartrate (LOPRESSOR) 25 MG tablet Take 0.5 tablets (12.5 mg total) by mouth 2 (two) times daily. 30 tablet 11  . Multiple Vitamins-Minerals (EMERGEN-C IMMUNE PO) Take 1 tablet by mouth daily.    . OxyCODONE (OXYCONTIN) 20 mg T12A 12 hr tablet Take 20 mg by mouth every 12 (twelve) hours.    Marland Kitchen oxyCODONE-acetaminophen (PERCOCET) 7.5-325 MG per tablet Take 1 tablet by mouth every 8 (eight) hours as needed for severe pain.    . promethazine (PHENERGAN) 25 MG tablet Take 25 mg by mouth every 6 (six) hours as needed for nausea or vomiting.    . ranitidine (ZANTAC) 150 MG tablet Take 150 mg by mouth daily.     . solifenacin (VESICARE) 5 MG tablet Take 5 mg by mouth daily.    . SUMAtriptan (IMITREX) 100 MG tablet Take 100 mg by mouth  every 2 (two) hours as needed for migraine. May repeat in 2 hours if headache persists or recurs.    . furosemide (LASIX) 20 MG tablet TAKE 1 TABLET BY MOUTH EVERY DAY (Patient not taking: Reported on 12/29/2015) 90 tablet 0  . traMADol (ULTRAM) 50 MG tablet Take 1 tablet (50 mg total) by mouth every 6 (six) hours as needed for moderate pain. (Patient not taking: Reported on 12/29/2015) 30 tablet 0   No current facility-administered medications for this visit.    No Known Allergies  Review of Systems  No shortness of breath She has gained a few pounds No peripheral edema  BP 114/68 mmHg  Pulse 72  Resp 20  Ht 5\' 1"  (1.549 m)  Wt 98 lb (44.453 kg)  BMI 18.53 kg/m2  SpO2 96% Physical Exam Alert and comfortable Heart rate regular without murmur Breath sounds clear and equal Sternal incision well-healed  Diagnostic Tests: Chest x-ray shows resolution right pleural effusion  Impression: Patient is recovered well after aVR.  Plan:the patient returned to the care of her cardiologist and primary care physician and return to this office as needed for surgical issues.   Len Childs, MD [facility] [phone]

## 2016-01-19 DIAGNOSIS — E7800 Pure hypercholesterolemia, unspecified: Secondary | ICD-10-CM | POA: Diagnosis not present

## 2016-01-19 DIAGNOSIS — F418 Other specified anxiety disorders: Secondary | ICD-10-CM | POA: Diagnosis not present

## 2016-01-19 DIAGNOSIS — Z952 Presence of prosthetic heart valve: Secondary | ICD-10-CM | POA: Diagnosis not present

## 2016-01-19 DIAGNOSIS — I10 Essential (primary) hypertension: Secondary | ICD-10-CM | POA: Diagnosis not present

## 2016-01-19 DIAGNOSIS — G43009 Migraine without aura, not intractable, without status migrainosus: Secondary | ICD-10-CM | POA: Diagnosis not present

## 2016-01-20 DIAGNOSIS — R928 Other abnormal and inconclusive findings on diagnostic imaging of breast: Secondary | ICD-10-CM | POA: Diagnosis not present

## 2016-01-20 DIAGNOSIS — C50119 Malignant neoplasm of central portion of unspecified female breast: Secondary | ICD-10-CM | POA: Diagnosis not present

## 2016-02-06 NOTE — Progress Notes (Signed)
Cardiology Office Note:    Date:  02/07/2016   ID:  Quantella Quam, DOB Apr 14, 1956, MRN [id number]  PCP:  Fae Pippin  Cardiologist:  Dr. Sherren Mocha   Electrophysiologist:  n/a  Chief Complaint  Patient presents with  . s/p AVR    Follow up    History of Present Illness:     Jayceon Baldez is a 60 y.o. female with a hx of bicuspid aortic valve with associated aortic stenosis. Other hx includes spinal stenosis, PUD, HTN, HL, iron deficiency anemia, COPD, tobacco abuse. She saw her PCP in 8/16 and FU echo demonstrated severe AS with mean gradient 50 mmHg. She was referred to Dr. Prescott Gum. Cardiac cath was arranged and demonstrated normal coronary arteries. In 10/16, she underwent bioprosthetic AVR with Dr. Prescott Gum. Last seen by me 11/16.   Returns for follow-up. She had a post op R effusion. Last visit with Dr. Prescott Gum in 1/17.  R pleural effusion was resolved at that time.  She is doing fairly well. She does note some fatigue. She has seen her primary care provider. Her antidepressant has been changed. Chest remains slightly sore at times. She denies significant dyspnea. She denies orthopnea, PND or edema. Denies syncope. She is off of statin therapy. Recent lipid panel was apparently optimal.    Past Medical History  Diagnosis Date  . Hypertension   . Aortic valve disease     a. s/p AVR 11/16;  b. Echo 11/16: mild LVH, vigorous LVF, no RWMA, bioprosthetic AVR ok withmean 13 mmHg and trivial AI, trivial effusion  . History of cardiac catheterization     a. LHC 9/16: normal coronary arteries with very large RCA  . Hypercholesterolemia     Simvastatin  . Carotid stenosis     a. Carotid US 10/16: bilateral ICA 1-39%  . Spinal stenosis   . Iron deficiency anemia   . Osteoarthritis     hands  . Breast cancer [facility])     Dr Bobby Rumpf Dr Rogelia Boga, right side 2011  . Raynaud's syndrome   . Diverticulosis   . PUD (peptic ulcer disease)   . GERD  (gastroesophageal reflux disease)     Nexium  . Overactive bladder   . Hearing difficulty of left ear   . Paralysis of right upper extremity ([place])   . History of migraine headaches   . Anxiety and depression     Past Surgical History  Procedure Laterality Date  . Cesarean section      x 2  . Tubal ligation    . Cardiac catheterization N/A 09/03/2015    Procedure: Right/Left Heart Cath and Coronary Angiography;  Surgeon: Jolaine Artist, MD;  Location: [place] [facility];  Service: Cardiovascular;  Laterality: N/A;  . Breast lumpectomy with axillary lymph node biopsy Right 2011  . Breast surgery  2011, 2014    right side x 2  . Colonoscopy  2014  . Esophagogastroduodenoscopy    . Tonsillectomy    . Aortic valve replacement N/A 09/30/2015    Procedure: AORTIC VALVE REPLACEMENT (AVR);  Surgeon: Ivin Poot, MD;  Location: [place];  Service: Open Heart Surgery;  Laterality: N/A;  . Tee without cardioversion N/A 09/30/2015    Procedure: TRANSESOPHAGEAL ECHOCARDIOGRAM (TEE);  Surgeon: Ivin Poot, MD;  Location: [place];  Service: Open Heart Surgery;  Laterality: N/A;  . Trigger finger release    . Appendectomy      Current Medications: Outpatient Prescriptions Prior to Visit  Medication Sig Dispense Refill  . aspirin 81 MG EC tablet Take 1 tablet (81 mg total) by mouth daily.    . Aspirin-Acetaminophen-Caffeine (GOODY HEADACHE PO) Take 1 each by mouth 2 (two) times daily as needed (PAIN).    Marland Kitchen esomeprazole (NEXIUM) 40 MG capsule Take 40 mg by mouth daily at 12 noon.    . metoprolol tartrate (LOPRESSOR) 25 MG tablet Take 0.5 tablets (12.5 mg total) by mouth 2 (two) times daily. 30 tablet 11  . Multiple Vitamins-Minerals (EMERGEN-C IMMUNE PO) Take 1 tablet by mouth daily.    . OxyCODONE (OXYCONTIN) 20 mg T12A 12 hr tablet Take 20 mg by mouth every 12 (twelve) hours.    Marland Kitchen oxyCODONE-acetaminophen (PERCOCET) 7.5-325 MG per tablet Take 1 tablet by mouth every 8 (eight) hours as  needed for severe pain.    . promethazine (PHENERGAN) 25 MG tablet Take 25 mg by mouth every 6 (six) hours as needed for nausea or vomiting.    . SUMAtriptan (IMITREX) 100 MG tablet Take 100 mg by mouth every 2 (two) hours as needed for migraine. May repeat in 2 hours if headache persists or recurs.    . citalopram (CELEXA) 20 MG tablet Take 20 mg by mouth daily. Reported on 02/07/2016    . furosemide (LASIX) 20 MG tablet TAKE 1 TABLET BY MOUTH EVERY DAY (Patient not taking: Reported on 02/07/2016) 90 tablet 0  . ranitidine (ZANTAC) 150 MG tablet Take 150 mg by mouth daily. Reported on 02/07/2016    . solifenacin (VESICARE) 5 MG tablet Take 5 mg by mouth daily. Reported on 02/07/2016    . traMADol (ULTRAM) 50 MG tablet Take 1 tablet (50 mg total) by mouth every 6 (six) hours as needed for moderate pain. (Patient not taking: Reported on 12/29/2015) 30 tablet 0   No facility-administered medications prior to visit.     Allergies:   Review of patient's allergies indicates no known allergies.   Social History   Social History  . Marital Status: Married    Spouse Name: N/A  . Number of Children: 2  . Years of Education: N/A   Occupational History  . disabled    Social History Main Topics  . Smoking status: Former Smoker -- 0.50 packs/day    Types: Cigarettes    Quit date: 09/30/2015  . Smokeless tobacco: None  . Alcohol Use: No  . Drug Use: No  . Sexual Activity: Not Asked   Other Topics Concern  . None   Social History Narrative     Family History:  The patient's family history includes COPD in her brother and father; Heart disease in her brother, father, and sister; Hyperlipidemia in her brother, father, and sister; Hypertension in her brother, father, mother, and sister.   ROS:   Please see the history of present illness.    Review of Systems  Constitution: Positive for malaise/fatigue.  All other systems reviewed and are negative.   Physical Exam:   VS:  BP 112/60 mmHg   Pulse 66  Ht 5\' 1"  (1.549 m)  Wt 109 lb (49.442 kg)  BMI 20.61 kg/m2   GEN: Well nourished, well developed, in no acute distress HEENT: normal Neck: no JVD, no masses Cardiac: Normal 99991111, RRR; 2/6 systolic murmurs RUSB and LSB,   no edema    Respiratory:  clear to auscultation bilaterally; no wheezing, rhonchi or rales GI: soft, nontender  MS: no deformity or atrophy Skin: warm and dry,  Neuro:   no focal  deficits  Psych: Alert and oriented x 3, normal affect   Wt Readings from Last 3 Encounters:  02/07/16 109 lb (49.442 kg)  12/29/15 98 lb (44.453 kg)  11/18/15 98 lb (44.453 kg)      Studies/Labs Reviewed:     EKG:  EKG is  ordered today.  The ekg ordered today demonstrates NSR, HR 67, T-wave inversions 2, 3, aVF, V3-V6, no significant change compared to prior tracings, QTc 409 ms  Recent Labs: 09/24/2015: ALT 21 10/01/2015: Magnesium 1.6* 10/04/2015: BUN 9; Creatinine, Ser 0.41*; Potassium 4.3; Sodium 135 10/05/2015: Hemoglobin 9.4*; Platelets 196   Recent Lipid Panel No results found for: CHOL, TRIG, HDL, CHOLHDL, VLDL, LDLCALC, LDLDIRECT  Additional studies/ records that were reviewed today include:   Echo 11/16 Mild LVH, vigorous LVF, EF 65-70%, normal wall motion, normal diastolic function, bioprosthetic AVR okay with trivial AI, trivial pericardial effusion    ASSESSMENT:     1. S/P AVR   2. Essential hypertension     PLAN:     In order of problems listed above:  1. S/p AVR -  History of bicuspid aortic valve now status post bioprosthetic AVR. She seems to be doing well.  She has had some fatigue but her PCP has recently done a workup and she has had her antidepressant changed.  -  Continue SBE prophylaxis.   2. HTN: Controlled.   Medication Adjustments/Labs and Tests Ordered: Current medicines are reviewed at length with the patient today.  Concerns regarding medicines are outlined above.  Medication changes, Labs and Tests ordered today are  outlined in the Patient Instructions noted below. Patient Instructions  Medication Instructions: Your physician recommends that you continue on your current medications as directed. Please refer to the Current Medication list given to you today.  Labwork: NONE  Testing/Procedures: NONE  Follow-Up: DR. Burt Knack 07/2016  Any Other Special Instructions Will Be Listed Below (If Applicable).  If you need a refill on your cardiac medications before your next appointment, please call your pharmacy.   Signed, Richardson Dopp, PA-C  02/07/2016 11:48 AM    [facility] [address], [place], [place]  [address] Phone: [phone]; Fax: [phone]

## 2016-02-07 ENCOUNTER — Encounter: Payer: Self-pay | Admitting: Physician Assistant

## 2016-02-07 ENCOUNTER — Ambulatory Visit (INDEPENDENT_AMBULATORY_CARE_PROVIDER_SITE_OTHER): Payer: Medicare Other | Admitting: Physician Assistant

## 2016-02-07 VITALS — BP 112/60 | HR 66 | Ht 61.0 in | Wt 109.0 lb

## 2016-02-07 DIAGNOSIS — Z952 Presence of prosthetic heart valve: Secondary | ICD-10-CM

## 2016-02-07 DIAGNOSIS — Z954 Presence of other heart-valve replacement: Secondary | ICD-10-CM

## 2016-02-07 DIAGNOSIS — I10 Essential (primary) hypertension: Secondary | ICD-10-CM

## 2016-02-07 NOTE — Patient Instructions (Addendum)
Medication Instructions: Your physician recommends that you continue on your current medications as directed. Please refer to the Current Medication list given to you today.  Labwork: NONE  Testing/Procedures: NONE  Follow-Up: DR. Burt Knack 07/2016  Any Other Special Instructions Will Be Listed Below (If Applicable).  If you need a refill on your cardiac medications before your next appointment, please call your pharmacy.

## 2016-02-10 DIAGNOSIS — M5136 Other intervertebral disc degeneration, lumbar region: Secondary | ICD-10-CM | POA: Diagnosis not present

## 2016-02-10 DIAGNOSIS — M4120 Other idiopathic scoliosis, site unspecified: Secondary | ICD-10-CM | POA: Diagnosis not present

## 2016-02-10 DIAGNOSIS — M545 Low back pain: Secondary | ICD-10-CM | POA: Diagnosis not present

## 2016-02-10 DIAGNOSIS — M4806 Spinal stenosis, lumbar region: Secondary | ICD-10-CM | POA: Diagnosis not present

## 2016-02-10 DIAGNOSIS — M5416 Radiculopathy, lumbar region: Secondary | ICD-10-CM | POA: Diagnosis not present

## 2016-02-10 DIAGNOSIS — M62838 Other muscle spasm: Secondary | ICD-10-CM | POA: Diagnosis not present

## 2016-02-14 DIAGNOSIS — C773 Secondary and unspecified malignant neoplasm of axilla and upper limb lymph nodes: Secondary | ICD-10-CM | POA: Diagnosis not present

## 2016-02-14 DIAGNOSIS — C50111 Malignant neoplasm of central portion of right female breast: Secondary | ICD-10-CM | POA: Diagnosis not present

## 2016-02-14 DIAGNOSIS — Z170 Estrogen receptor positive status [ER+]: Secondary | ICD-10-CM | POA: Diagnosis not present

## 2016-03-08 ENCOUNTER — Other Ambulatory Visit: Payer: Self-pay | Admitting: *Deleted

## 2016-05-03 DIAGNOSIS — M62838 Other muscle spasm: Secondary | ICD-10-CM | POA: Diagnosis not present

## 2016-05-03 DIAGNOSIS — M545 Low back pain: Secondary | ICD-10-CM | POA: Diagnosis not present

## 2016-05-03 DIAGNOSIS — M4120 Other idiopathic scoliosis, site unspecified: Secondary | ICD-10-CM | POA: Diagnosis not present

## 2016-05-03 DIAGNOSIS — M5136 Other intervertebral disc degeneration, lumbar region: Secondary | ICD-10-CM | POA: Diagnosis not present

## 2016-07-19 DIAGNOSIS — Z952 Presence of prosthetic heart valve: Secondary | ICD-10-CM | POA: Diagnosis not present

## 2016-07-19 DIAGNOSIS — F418 Other specified anxiety disorders: Secondary | ICD-10-CM | POA: Diagnosis not present

## 2016-07-19 DIAGNOSIS — Z6822 Body mass index (BMI) 22.0-22.9, adult: Secondary | ICD-10-CM | POA: Diagnosis not present

## 2016-07-19 DIAGNOSIS — Z1389 Encounter for screening for other disorder: Secondary | ICD-10-CM | POA: Diagnosis not present

## 2016-07-19 DIAGNOSIS — E2839 Other primary ovarian failure: Secondary | ICD-10-CM | POA: Diagnosis not present

## 2016-07-19 DIAGNOSIS — G43009 Migraine without aura, not intractable, without status migrainosus: Secondary | ICD-10-CM | POA: Diagnosis not present

## 2016-07-19 DIAGNOSIS — M545 Low back pain: Secondary | ICD-10-CM | POA: Diagnosis not present

## 2016-07-19 DIAGNOSIS — K219 Gastro-esophageal reflux disease without esophagitis: Secondary | ICD-10-CM | POA: Diagnosis not present

## 2016-07-19 DIAGNOSIS — Z0000 Encounter for general adult medical examination without abnormal findings: Secondary | ICD-10-CM | POA: Diagnosis not present

## 2016-07-19 DIAGNOSIS — I10 Essential (primary) hypertension: Secondary | ICD-10-CM | POA: Diagnosis not present

## 2016-07-19 DIAGNOSIS — E7800 Pure hypercholesterolemia, unspecified: Secondary | ICD-10-CM | POA: Diagnosis not present

## 2016-07-24 ENCOUNTER — Encounter: Payer: Self-pay | Admitting: Physician Assistant

## 2016-07-25 ENCOUNTER — Telehealth: Payer: Self-pay | Admitting: *Deleted

## 2016-07-25 NOTE — Telephone Encounter (Signed)
Pt notified of lab results and findings by phone with verbal understanding. Pt advised to keep working on diet to help lower LDL. Pt agreeable to plan of care.

## 2016-07-26 DIAGNOSIS — M5136 Other intervertebral disc degeneration, lumbar region: Secondary | ICD-10-CM | POA: Diagnosis not present

## 2016-07-26 DIAGNOSIS — M62838 Other muscle spasm: Secondary | ICD-10-CM | POA: Diagnosis not present

## 2016-07-26 DIAGNOSIS — M4120 Other idiopathic scoliosis, site unspecified: Secondary | ICD-10-CM | POA: Diagnosis not present

## 2016-07-26 DIAGNOSIS — M545 Low back pain: Secondary | ICD-10-CM | POA: Diagnosis not present

## 2016-10-04 DIAGNOSIS — Z23 Encounter for immunization: Secondary | ICD-10-CM | POA: Diagnosis not present

## 2016-10-05 ENCOUNTER — Encounter: Payer: Self-pay | Admitting: Cardiovascular Disease

## 2016-10-19 ENCOUNTER — Ambulatory Visit (INDEPENDENT_AMBULATORY_CARE_PROVIDER_SITE_OTHER): Payer: Medicare Other | Admitting: Cardiovascular Disease

## 2016-10-19 ENCOUNTER — Encounter: Payer: Self-pay | Admitting: Cardiovascular Disease

## 2016-10-19 VITALS — BP 128/70 | HR 78 | Ht 61.0 in | Wt 118.8 lb

## 2016-10-19 DIAGNOSIS — E785 Hyperlipidemia, unspecified: Secondary | ICD-10-CM | POA: Diagnosis not present

## 2016-10-19 DIAGNOSIS — I10 Essential (primary) hypertension: Secondary | ICD-10-CM | POA: Diagnosis not present

## 2016-10-19 DIAGNOSIS — Z952 Presence of prosthetic heart valve: Secondary | ICD-10-CM | POA: Diagnosis not present

## 2016-10-19 NOTE — Progress Notes (Signed)
Cardiology Office Note Date:  10/19/2016   ID:  Stephanie Velasquez, DOB 12/25/1955, MRN [id number]  PCP:  Cyndi Bender, PA-C  Cardiologist:  Sherren Mocha, MD    Chief Complaint  Patient presents with  . Fatigue   History of Present Illness: Stephanie Velasquez is a 60 y.o. female who presents for follow-up of aortic valve disease. The patient has a history of severe bicuspid valve aortic stenosis and she underwent bioprosthetic aortic valve replacement with that 23 mm magna ease tissue valve in October 2016. Postoperative echo demonstrated normal function of her bioprosthesis. She was last seen by Richardson Dopp in February 2017. She is here to establish follow-up with me today. The patient is here with her husband. She's had a lot of fatigue since surgery, but otherwise is doing fairly well. Has had slow improvement over the last year. She denies symptoms of exertional shortness of breath or chest discomfort. She's had no lightheadedness or syncope. She denies heart palpitations. She associates metoprolol with worsening fatigue and she quit taking this about 2 weeks ago. States her blood pressure has been running good even off of the metoprolol.  Past Medical History:  Diagnosis Date  . Anxiety and depression   . Aortic valve disease    a. s/p AVR 11/16;  b. Echo 11/16: mild LVH, vigorous LVF, no RWMA, bioprosthetic AVR ok withmean 13 mmHg and trivial AI, trivial effusion  . Breast cancer [facility])    Dr Bobby Rumpf Dr Rogelia Boga, right side 2011  . Carotid stenosis    a. Carotid US 10/16: bilateral ICA 1-39%  . Diverticulosis   . GERD (gastroesophageal reflux disease)    Nexium  . Hearing difficulty of left ear   . History of cardiac catheterization    a. LHC 9/16: normal coronary arteries with very large RCA  . History of migraine headaches   . Hypercholesterolemia    Simvastatin  . Hypertension   . Iron deficiency anemia   . Osteoarthritis    hands  . Overactive bladder   . Paralysis  of right upper extremity ([place])   . PUD (peptic ulcer disease)   . Raynaud's syndrome   . Spinal stenosis     Past Surgical History:  Procedure Laterality Date  . AORTIC VALVE REPLACEMENT N/A 09/30/2015   Procedure: AORTIC VALVE REPLACEMENT (AVR);  Surgeon: Ivin Poot, MD;  Location: [place];  Service: Open Heart Surgery;  Laterality: N/A;  . APPENDECTOMY    . BREAST LUMPECTOMY WITH AXILLARY LYMPH NODE BIOPSY Right 2011  . BREAST SURGERY  2011, 2014   right side x 2  . CARDIAC CATHETERIZATION N/A 09/03/2015   Procedure: Right/Left Heart Cath and Coronary Angiography;  Surgeon: Jolaine Artist, MD;  Location: [place] CV LAB;  Service: Cardiovascular;  Laterality: N/A;  . CESAREAN SECTION     x 2  . COLONOSCOPY  2014  . ESOPHAGOGASTRODUODENOSCOPY    . TEE WITHOUT CARDIOVERSION N/A 09/30/2015   Procedure: TRANSESOPHAGEAL ECHOCARDIOGRAM (TEE);  Surgeon: Ivin Poot, MD;  Location: [place];  Service: Open Heart Surgery;  Laterality: N/A;  . TONSILLECTOMY    . TRIGGER FINGER RELEASE    . TUBAL LIGATION      Current Outpatient Prescriptions  Medication Sig Dispense Refill  . aspirin 81 MG EC tablet Take 1 tablet (81 mg total) by mouth daily.    . Aspirin-Acetaminophen-Caffeine (GOODY HEADACHE PO) Take 1 each by mouth 2 (two) times daily as needed (PAIN).    Marland Kitchen escitalopram (  LEXAPRO) 20 MG tablet TAKE ONE TABLET  20 MG BY MOUTH ONCE DAILY.  1  . esomeprazole (NEXIUM) 40 MG capsule Take 40 mg by mouth daily at 12 noon.    . Multiple Vitamins-Minerals (EMERGEN-C IMMUNE PO) Take 1 tablet by mouth daily.    Marland Kitchen oxyCODONE-acetaminophen (PERCOCET) 7.5-325 MG per tablet Take 1 tablet by mouth every 8 (eight) hours as needed for severe pain.    . promethazine (PHENERGAN) 25 MG tablet Take 25 mg by mouth every 6 (six) hours as needed for nausea or vomiting.    . SUMAtriptan (IMITREX) 100 MG tablet Take 100 mg by mouth every 2 (two) hours as needed for migraine. May repeat in 2 hours if  headache persists or recurs.     No current facility-administered medications for this visit.     Allergies:   Naproxen   Social History:  The patient  reports that she quit smoking about 12 months ago. Her smoking use included Cigarettes. She smoked 0.50 packs per day. She has never used smokeless tobacco. She reports that she does not drink alcohol or use drugs.   Family History:  The patient's  family history includes COPD in her brother and father; Heart disease in her brother, father, and sister; Hyperlipidemia in her brother, father, and sister; Hypertension in her brother, father, mother, and sister.    ROS:  Please see the history of present illness. All other systems are reviewed and negative.    PHYSICAL EXAM: VS:  BP 128/70   Pulse 78   Ht 5\' 1"  (1.549 m)   Wt 53.9 kg (118 lb 12.8 oz)   BMI 22.45 kg/m  , BMI Body mass index is 22.45 kg/m. GEN: Well nourished, well developed, in no acute distress  HEENT: normal  Neck: no JVD, no masses. No carotid bruits Cardiac: RRR with 2/6 systolic murmur at the right upper sternal border              Respiratory:  clear to auscultation bilaterally, normal work of breathing GI: soft, nontender, nondistended, + BS MS: no deformity or atrophy  Ext: no pretibial edema, pedal pulses 2+= bilaterally Skin: warm and dry, no rash Neuro:  Strength and sensation are intact Psych: euthymic mood, full affect  EKG:  EKG is ordered today. The ekg ordered today shows normal sinus rhythm 69 bpm, possible age-indeterminate septal infarct  Recent Labs: No results found for requested labs within last 8760 hours.   Lipid Panel  No results found for: CHOL, TRIG, HDL, CHOLHDL, VLDL, LDLCALC, LDLDIRECT    Wt Readings from Last 3 Encounters:  10/19/16 53.9 kg (118 lb 12.8 oz)  02/07/16 49.4 kg (109 lb)  12/29/15 44.5 kg (98 lb)     Cardiac Studies Reviewed: 2-D echocardiogram 11/16/2015: Study Conclusions  - Left ventricle: The cavity  size was normal. There was mild   concentric hypertrophy. Systolic function was vigorous. The   estimated ejection fraction was in the range of 65% to 70%. Wall   motion was normal; there were no regional wall motion   abnormalities. Left ventricular diastolic function parameters   were normal. - Aortic valve: A bioprosthesis was present. There was trivial   regurgitation. Valve area (VTI): 1.5 cm^2. Valve area (Vmax):   1.25 cm^2. Valve area (Vmean): 1.38 cm^2. - Pericardium, extracardiac: A trivial pericardial effusion was   identified.  ASSESSMENT AND PLAN: Aortic valve disease status post bioprosthetic aortic valve replacement: Patient with an H class I symptoms now  one year out from surgery. She will remain on aspirin 81 mg daily. No indication for metoprolol and side effects of fatigue are improved since she has stopped this. She should return in 6 months for follow-up evaluation. SBE prophylaxis guidelines are reviewed with her today.  Current medicines are reviewed with the patient today.  The patient does not have concerns regarding medicines.  Labs/ tests ordered today include:   Orders Placed This Encounter  Procedures  . EKG 12-Lead    Disposition:   FU 6 months with APP  Signed, Sherren Mocha, MD  10/19/2016 1:08 PM    [facility] [address], [place], [place]  [address] Phone: [phone]; Fax: [phone]

## 2016-10-19 NOTE — Patient Instructions (Addendum)
Medication Instructions:  Your physician has recommended you make the following change in your medication:  1. STOP Metoprolol Tartrate  Labwork: No new orders.   Testing/Procedures: No new orders.   Follow-Up: Your physician recommends that you schedule a follow-up appointment in: 6 MONTHS with Richardson Dopp PA-C   Any Other Special Instructions Will Be Listed Below (If Applicable).  Your physician discussed the importance of taking an antibiotic prior to any dental, gastrointestinal, genitourinary procedures to prevent damage to the heart valves from infection.   If you need a refill on your cardiac medications before your next appointment, please call your pharmacy.

## 2016-10-30 DIAGNOSIS — M4120 Other idiopathic scoliosis, site unspecified: Secondary | ICD-10-CM | POA: Diagnosis not present

## 2016-10-30 DIAGNOSIS — M5136 Other intervertebral disc degeneration, lumbar region: Secondary | ICD-10-CM | POA: Diagnosis not present

## 2016-10-30 DIAGNOSIS — M545 Low back pain: Secondary | ICD-10-CM | POA: Diagnosis not present

## 2016-10-30 DIAGNOSIS — M62838 Other muscle spasm: Secondary | ICD-10-CM | POA: Diagnosis not present

## 2017-02-12 DIAGNOSIS — M5136 Other intervertebral disc degeneration, lumbar region: Secondary | ICD-10-CM | POA: Diagnosis not present

## 2017-02-12 DIAGNOSIS — M545 Low back pain: Secondary | ICD-10-CM | POA: Diagnosis not present

## 2017-02-12 DIAGNOSIS — M62838 Other muscle spasm: Secondary | ICD-10-CM | POA: Diagnosis not present

## 2017-02-12 DIAGNOSIS — M4120 Other idiopathic scoliosis, site unspecified: Secondary | ICD-10-CM | POA: Diagnosis not present

## 2017-02-12 DIAGNOSIS — I10 Essential (primary) hypertension: Secondary | ICD-10-CM | POA: Diagnosis not present

## 2017-03-15 DIAGNOSIS — R922 Inconclusive mammogram: Secondary | ICD-10-CM | POA: Diagnosis not present

## 2017-03-15 DIAGNOSIS — C50111 Malignant neoplasm of central portion of right female breast: Secondary | ICD-10-CM | POA: Diagnosis not present

## 2017-03-26 DIAGNOSIS — C773 Secondary and unspecified malignant neoplasm of axilla and upper limb lymph nodes: Secondary | ICD-10-CM | POA: Diagnosis not present

## 2017-03-26 DIAGNOSIS — C50111 Malignant neoplasm of central portion of right female breast: Secondary | ICD-10-CM | POA: Diagnosis not present

## 2017-03-26 DIAGNOSIS — Z170 Estrogen receptor positive status [ER+]: Secondary | ICD-10-CM | POA: Diagnosis not present

## 2017-04-18 ENCOUNTER — Encounter: Payer: Self-pay | Admitting: Physician Assistant

## 2017-04-18 ENCOUNTER — Ambulatory Visit (INDEPENDENT_AMBULATORY_CARE_PROVIDER_SITE_OTHER): Payer: Medicare Other | Admitting: Physician Assistant

## 2017-04-18 VITALS — BP 116/78 | HR 75 | Ht 61.0 in | Wt 117.8 lb

## 2017-04-18 DIAGNOSIS — Z952 Presence of prosthetic heart valve: Secondary | ICD-10-CM | POA: Diagnosis not present

## 2017-04-18 DIAGNOSIS — J449 Chronic obstructive pulmonary disease, unspecified: Secondary | ICD-10-CM

## 2017-04-18 NOTE — Patient Instructions (Addendum)
Medication Instructions:  No changes. Do not forget that you need antibiotics for any dental procedures, etc.  Labwork: None   Testing/Procedures: None   Follow-Up: Your physician wants you to follow-up in: 1 year with DR. Burt Knack OR Yolande Skoda, PAC You will receive a reminder letter in the mail two months in advance. If you don't receive a letter, please call our office to schedule the follow-up appointment.  Any Other Special Instructions Will Be Listed Below (If Applicable).  If you need a refill on your cardiac medications before your next appointment, please call your pharmacy.

## 2017-04-18 NOTE — Progress Notes (Signed)
Cardiology Office Note:    Date:  04/18/2017   ID:  Stephanie Velasquez, DOB 08-04-1956, MRN [id number]  PCP:  Cyndi Bender, PA-C  Cardiologist:  Dr. Sherren Mocha   Electrophysiologist:  n/a  Referring MD: Cyndi Bender, PA-C   Chief Complaint  Patient presents with  . Follow-up    s/p AVR    History of Present Illness:    Stephanie Velasquez is a 61 y.o. female with a hx of Bicuspid aortic valve with associated aortic stenosis status post bioprosthetic AVR in 10/16, spinal stenosis, PUD, HTN, HL, and deficiency anemia, tobacco abuse, COPD. Last seen by Dr. Burt Knack 11/17.  Here for Cardiology follow up.  Returns with her husband.  Since last seen, she denies chest pain, shortness of breath, syncope, orthopnea, PND or significant pedal edema. She feels an occasional skipped heart beat.  She had been fatigued and had no interest in doing things.  But, lately, she has felt more like increasing activity.   Prior CV studies:   The following studies were reviewed today:  Echo 11/16 Mild LVH, vigorous LVF, EF 65-70%, normal wall motion, normal diastolic function, bioprosthetic AVR okay with trivial AI, trivial pericardial effusion  Past Medical History:  Diagnosis Date  . Anxiety and depression   . Aortic valve disease    a. s/p AVR 11/16;  b. Echo 11/16: mild LVH, vigorous LVF, no RWMA, bioprosthetic AVR ok withmean 13 mmHg and trivial AI, trivial effusion  . Breast cancer [facility])    Dr Bobby Rumpf Dr Rogelia Boga, right side 2011  . Carotid stenosis    a. Carotid US 10/16: bilateral ICA 1-39%  . Diverticulosis   . GERD (gastroesophageal reflux disease)    Nexium  . Hearing difficulty of left ear   . History of cardiac catheterization    a. LHC 9/16: normal coronary arteries with very large RCA  . History of migraine headaches   . Hypercholesterolemia    Simvastatin  . Hypertension   . Iron deficiency anemia   . Osteoarthritis    hands  . Overactive bladder   . Paralysis of right  upper extremity ([place])   . PUD (peptic ulcer disease)   . Raynaud's syndrome   . Spinal stenosis     Past Surgical History:  Procedure Laterality Date  . AORTIC VALVE REPLACEMENT N/A 09/30/2015   Procedure: AORTIC VALVE REPLACEMENT (AVR);  Surgeon: Ivin Poot, MD;  Location: [place];  Service: Open Heart Surgery;  Laterality: N/A;  . APPENDECTOMY    . BREAST LUMPECTOMY WITH AXILLARY LYMPH NODE BIOPSY Right 2011  . BREAST SURGERY  2011, 2014   right side x 2  . CARDIAC CATHETERIZATION N/A 09/03/2015   Procedure: Right/Left Heart Cath and Coronary Angiography;  Surgeon: Jolaine Artist, MD;  Location: [place] CV LAB;  Service: Cardiovascular;  Laterality: N/A;  . CESAREAN SECTION     x 2  . COLONOSCOPY  2014  . ESOPHAGOGASTRODUODENOSCOPY    . TEE WITHOUT CARDIOVERSION N/A 09/30/2015   Procedure: TRANSESOPHAGEAL ECHOCARDIOGRAM (TEE);  Surgeon: Ivin Poot, MD;  Location: [place];  Service: Open Heart Surgery;  Laterality: N/A;  . TONSILLECTOMY    . TRIGGER FINGER RELEASE    . TUBAL LIGATION      Current Medications: Current Meds  Medication Sig  . aspirin 81 MG EC tablet Take 1 tablet (81 mg total) by mouth daily.  . Aspirin-Acetaminophen-Caffeine (GOODY HEADACHE PO) Take 1 each by mouth 2 (two) times daily as needed (PAIN).  Marland Kitchen  cyclobenzaprine (FLEXERIL) 10 MG tablet Take 10 mg by mouth at bedtime as needed. 1-2 TABS AT BEDTIME PRN  . escitalopram (LEXAPRO) 20 MG tablet TAKE ONE TABLET  20 MG BY MOUTH ONCE DAILY.  Marland Kitchen esomeprazole (NEXIUM) 40 MG capsule Take 40 mg by mouth daily at 12 noon.  . Multiple Vitamins-Minerals (EMERGEN-C IMMUNE PO) Take 1 tablet by mouth daily.  Marland Kitchen oxyCODONE-acetaminophen (PERCOCET) 7.5-325 MG per tablet Take 1 tablet by mouth every 8 (eight) hours as needed for severe pain.  . promethazine (PHENERGAN) 25 MG tablet Take 25 mg by mouth every 6 (six) hours as needed for nausea or vomiting.  . SUMAtriptan (IMITREX) 100 MG tablet Take 100 mg by mouth  every 2 (two) hours as needed for migraine. May repeat in 2 hours if headache persists or recurs.     Allergies:   Naproxen   Social History   Social History  . Marital status: Married    Spouse name: N/A  . Number of children: 2  . Years of education: N/A   Occupational History  . disabled    Social History Main Topics  . Smoking status: Former Smoker    Packs/day: 0.50    Types: Cigarettes    Quit date: 09/30/2015  . Smokeless tobacco: Never Used  . Alcohol use No  . Drug use: No  . Sexual activity: Not Asked   Other Topics Concern  . None   Social History Narrative  . None    Family Hx: The patient's family history includes COPD in her brother and father; Heart disease in her brother, father, and sister; Hyperlipidemia in her brother, father, and sister; Hypertension in her brother, father, mother, and sister.   ROS:   Please see the history of present illness.    Review of Systems  Musculoskeletal: Positive for back pain.   All other systems reviewed and are negative.   EKGs/Labs/Other Test Reviewed:    EKG:  EKG is  ordered today.  The ekg ordered today demonstrates NSR, HR 75, normal axis, TWI 2, 3, aVF, V3-5, no change from prior tracing.   Recent Labs: No results found for requested labs within last 8760 hours.  Labs 07/20/16: BUN 20, SCr 0.61, K 4.7, ALT 13, Hgb 10.8  Recent Lipid Panel No results found for: CHOL, TRIG, HDL, CHOLHDL, VLDL, LDLCALC, LDLDIRECT   Physical Exam:    VS:  BP 116/78   Pulse 75   Ht 5\' 1"  (1.549 m)   Wt 117 lb 12.8 oz (53.4 kg)   BMI 22.26 kg/m     Wt Readings from Last 3 Encounters:  04/18/17 117 lb 12.8 oz (53.4 kg)  10/19/16 118 lb 12.8 oz (53.9 kg)  02/07/16 109 lb (49.4 kg)     Physical Exam  Constitutional: She is oriented to person, place, and time. She appears well-developed and well-nourished. No distress.  HENT:  Head: Normocephalic and atraumatic.  Eyes: No scleral icterus.  Neck: Normal range of  motion. No JVD present.  Cardiovascular: Normal rate, regular rhythm, S1 normal and S2 normal.   Murmur heard.  Medium-pitched systolic murmur is present with a grade of 2/6  at the upper right sternal border Pulmonary/Chest: Breath sounds normal. She has no wheezes. She has no rhonchi. She has no rales.  Abdominal: Soft. There is no tenderness.  Musculoskeletal: She exhibits no edema.  Neurological: She is alert and oriented to person, place, and time.  Skin: Skin is warm and dry.  Psychiatric: She  has a normal mood and affect.    ASSESSMENT:    1. S/P AVR (aortic valve replacement)   2. Chronic obstructive pulmonary disease, unspecified COPD type ([place])    PLAN:    In order of problems listed above:  1. S/P AVR (aortic valve replacement) - Overall stable.  Echo in 11/16 with stable AVR.  Continue ASA.  Continue SBE prophylaxis.  FU 1 year.   2. Chronic obstructive pulmonary disease, unspecified COPD type ([place]) - She is not smoking.  FU with PCP as planned.   Dispo:  Return in about 1 year (around 04/18/2018) for Routine Follow Up, w/ Dr. Burt Knack or Richardson Dopp, PA-C.   Medication Adjustments/Labs and Tests Ordered: Current medicines are reviewed at length with the patient today.  Concerns regarding medicines are outlined above.  Orders placed this visit:  Orders Placed This Encounter  Procedures  . EKG 12-Lead   Medication changes this visit: No orders of the defined types were placed in this encounter.   Signed, Richardson Dopp, PA-C  04/18/2017 7:30 PM    [facility] [place], [place], [place]  [address] Phone: [phone]; Fax: [phone]

## 2017-05-03 DIAGNOSIS — M5136 Other intervertebral disc degeneration, lumbar region: Secondary | ICD-10-CM | POA: Diagnosis not present

## 2017-05-03 DIAGNOSIS — M545 Low back pain: Secondary | ICD-10-CM | POA: Diagnosis not present

## 2017-05-03 DIAGNOSIS — M4120 Other idiopathic scoliosis, site unspecified: Secondary | ICD-10-CM | POA: Diagnosis not present

## 2017-07-04 DIAGNOSIS — G43009 Migraine without aura, not intractable, without status migrainosus: Secondary | ICD-10-CM | POA: Diagnosis not present

## 2017-07-04 DIAGNOSIS — M545 Low back pain: Secondary | ICD-10-CM | POA: Diagnosis not present

## 2017-07-04 DIAGNOSIS — Z6822 Body mass index (BMI) 22.0-22.9, adult: Secondary | ICD-10-CM | POA: Diagnosis not present

## 2017-07-04 DIAGNOSIS — F418 Other specified anxiety disorders: Secondary | ICD-10-CM | POA: Diagnosis not present

## 2017-07-04 DIAGNOSIS — M7552 Bursitis of left shoulder: Secondary | ICD-10-CM | POA: Diagnosis not present

## 2017-08-02 DIAGNOSIS — M545 Low back pain: Secondary | ICD-10-CM | POA: Diagnosis not present

## 2017-08-02 DIAGNOSIS — M62838 Other muscle spasm: Secondary | ICD-10-CM | POA: Diagnosis not present

## 2017-08-02 DIAGNOSIS — M4120 Other idiopathic scoliosis, site unspecified: Secondary | ICD-10-CM | POA: Diagnosis not present

## 2017-08-02 DIAGNOSIS — M5136 Other intervertebral disc degeneration, lumbar region: Secondary | ICD-10-CM | POA: Diagnosis not present

## 2017-09-10 DIAGNOSIS — M4120 Other idiopathic scoliosis, site unspecified: Secondary | ICD-10-CM | POA: Diagnosis not present

## 2017-09-10 DIAGNOSIS — M47816 Spondylosis without myelopathy or radiculopathy, lumbar region: Secondary | ICD-10-CM | POA: Diagnosis not present

## 2017-10-13 IMAGING — CR DG CHEST 1V PORT
1 series · 1 of 1 positions shown · non-contrast
Comparison: September 24, 2015

CLINICAL DATA: Status post aortic valve replacement.  Hypoxia

EXAM:
PORTABLE CHEST 1 VIEW

[AP]
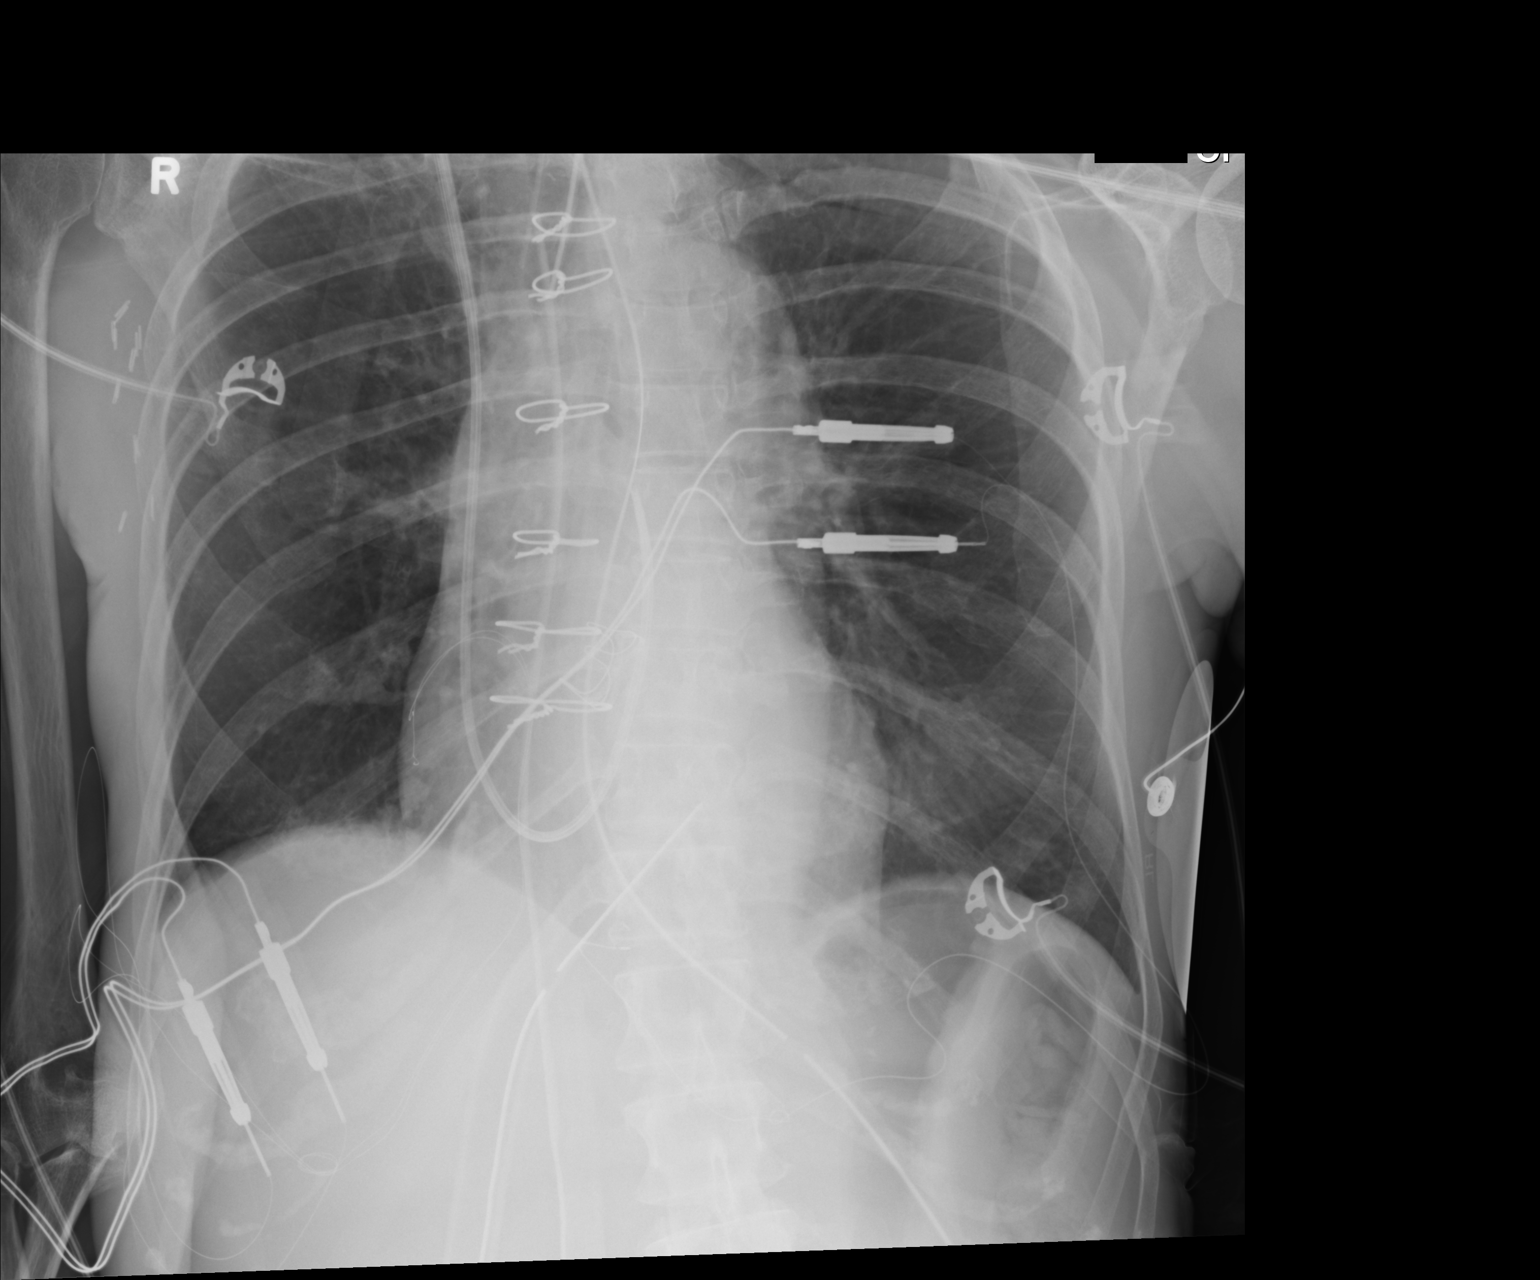

[1 of 1 positions shown; findings below may reference images not displayed]

FINDINGS: Endotracheal tube tip is 2.8 cm above the carina. Swan-Ganz catheter
tip is in the main pulmonary outflow tract. Nasogastric tube tip and
side port are in the stomach. There are mediastinal drains.
Pacemaker wires are attached to the right heart. No pneumothorax.
There is no edema or consolidation. The heart size and pulmonary
vascularity are normal. There is an aortic valve replacement
present. There are surgical clips in the right axillary region.
IMPRESSION: Tube and catheter positions as described without demonstrable
pneumothorax. No edema or consolidation.

## 2017-10-14 IMAGING — CR DG CHEST 1V PORT
1 series · 1 of 1 positions shown · non-contrast
Comparison: 09/30/2015, 09/15/2015 and 02/02/2012. CT neck
03/25/2012.

CLINICAL DATA: Aortic valve replacement.

EXAM:
PORTABLE CHEST 1 VIEW

[AP]
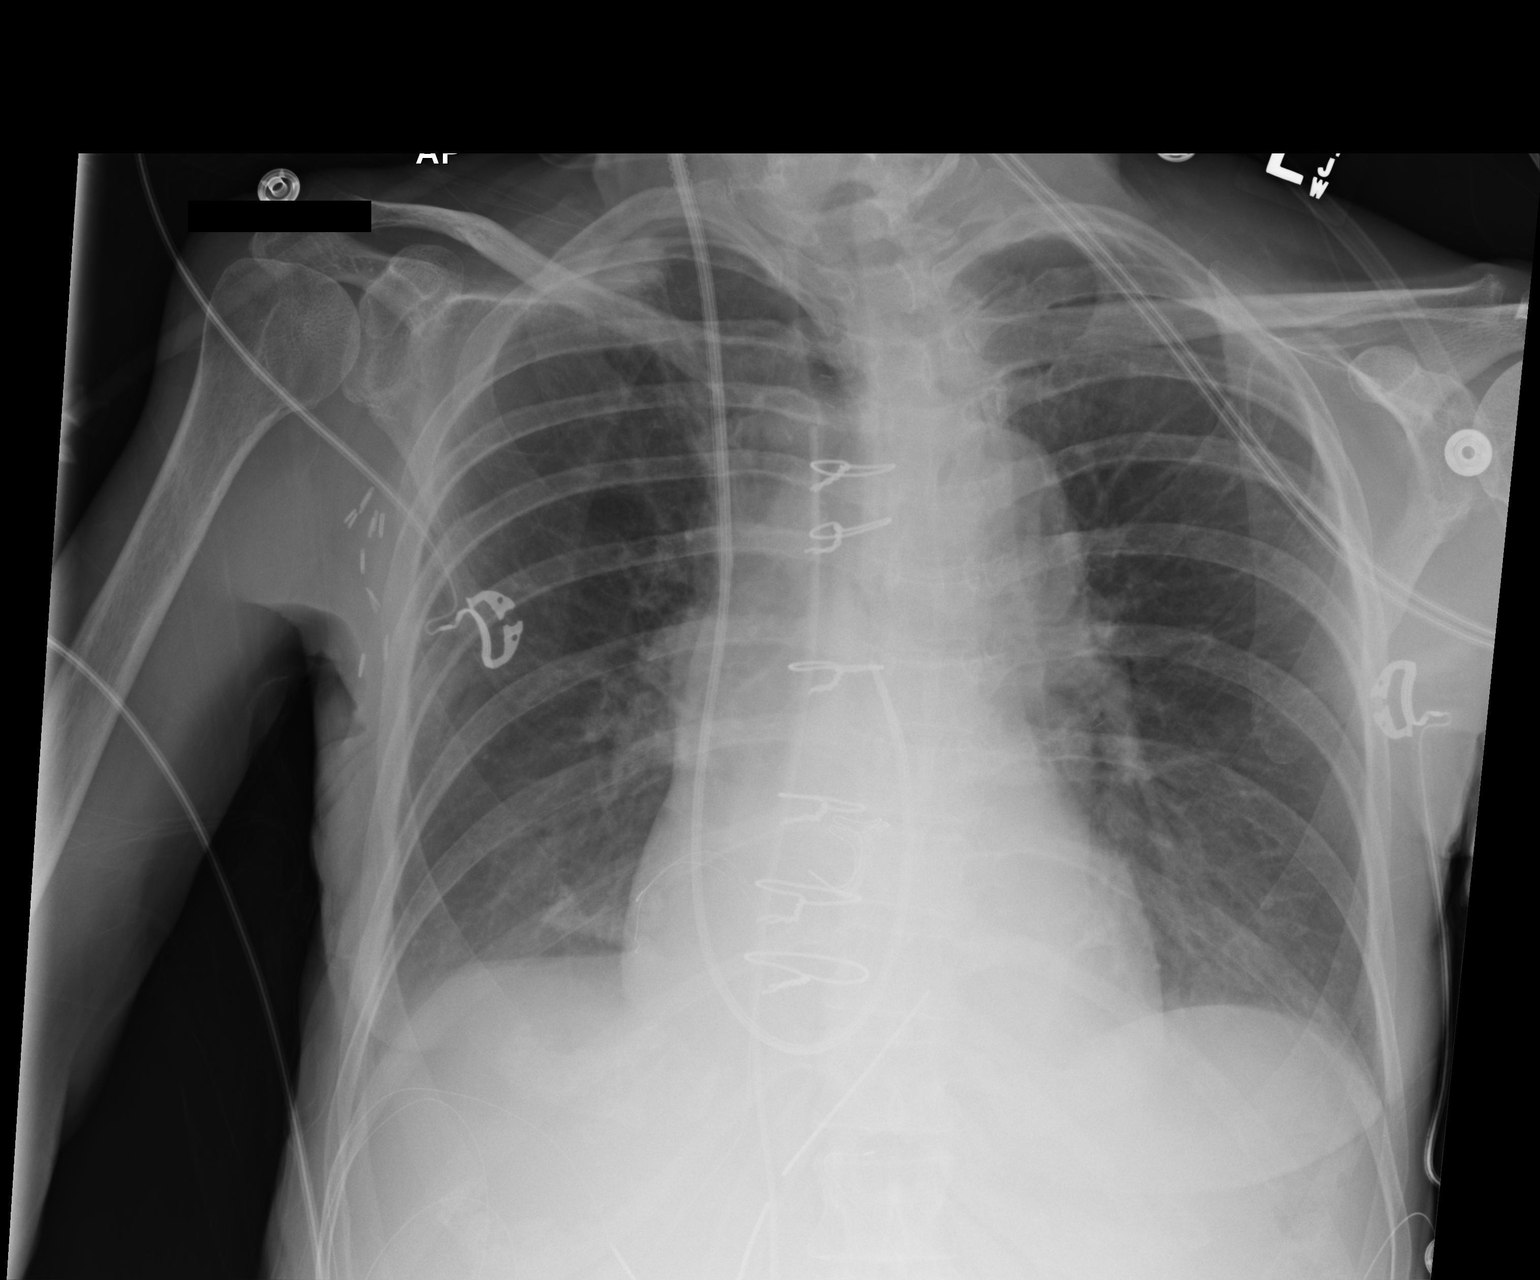

[1 of 1 positions shown; findings below may reference images not displayed]

FINDINGS: Interval extubation. Nasogastric tube has been removed as well.
Right IJ Swan-Ganz catheter tip projects over the proximal right
pulmonary artery. Mediastinal/ left chest drains and epicardial
pacer wires remain in place. Right apical nodular opacity is
unchanged from 09/15/2015. When compared with cross-sectional
imaging 03/25/2012, there appears to be subpleural scarring in this
location. Scout view appears similar from that study. Mild bibasilar
atelectasis. No pneumothorax. No definite pleural fluid. Surgical
clips in the right axilla. Right mastectomy.
IMPRESSION: Mild bibasilar atelectasis.

## 2017-10-15 DIAGNOSIS — Z23 Encounter for immunization: Secondary | ICD-10-CM | POA: Diagnosis not present

## 2017-10-15 IMAGING — CR DG CHEST 1V PORT
2 series · 2 of 2 positions shown · non-contrast
Comparison: One day prior

CLINICAL DATA: Shortness of breath.

EXAM:
PORTABLE CHEST 1 VIEW

[AP (1 of 2)]
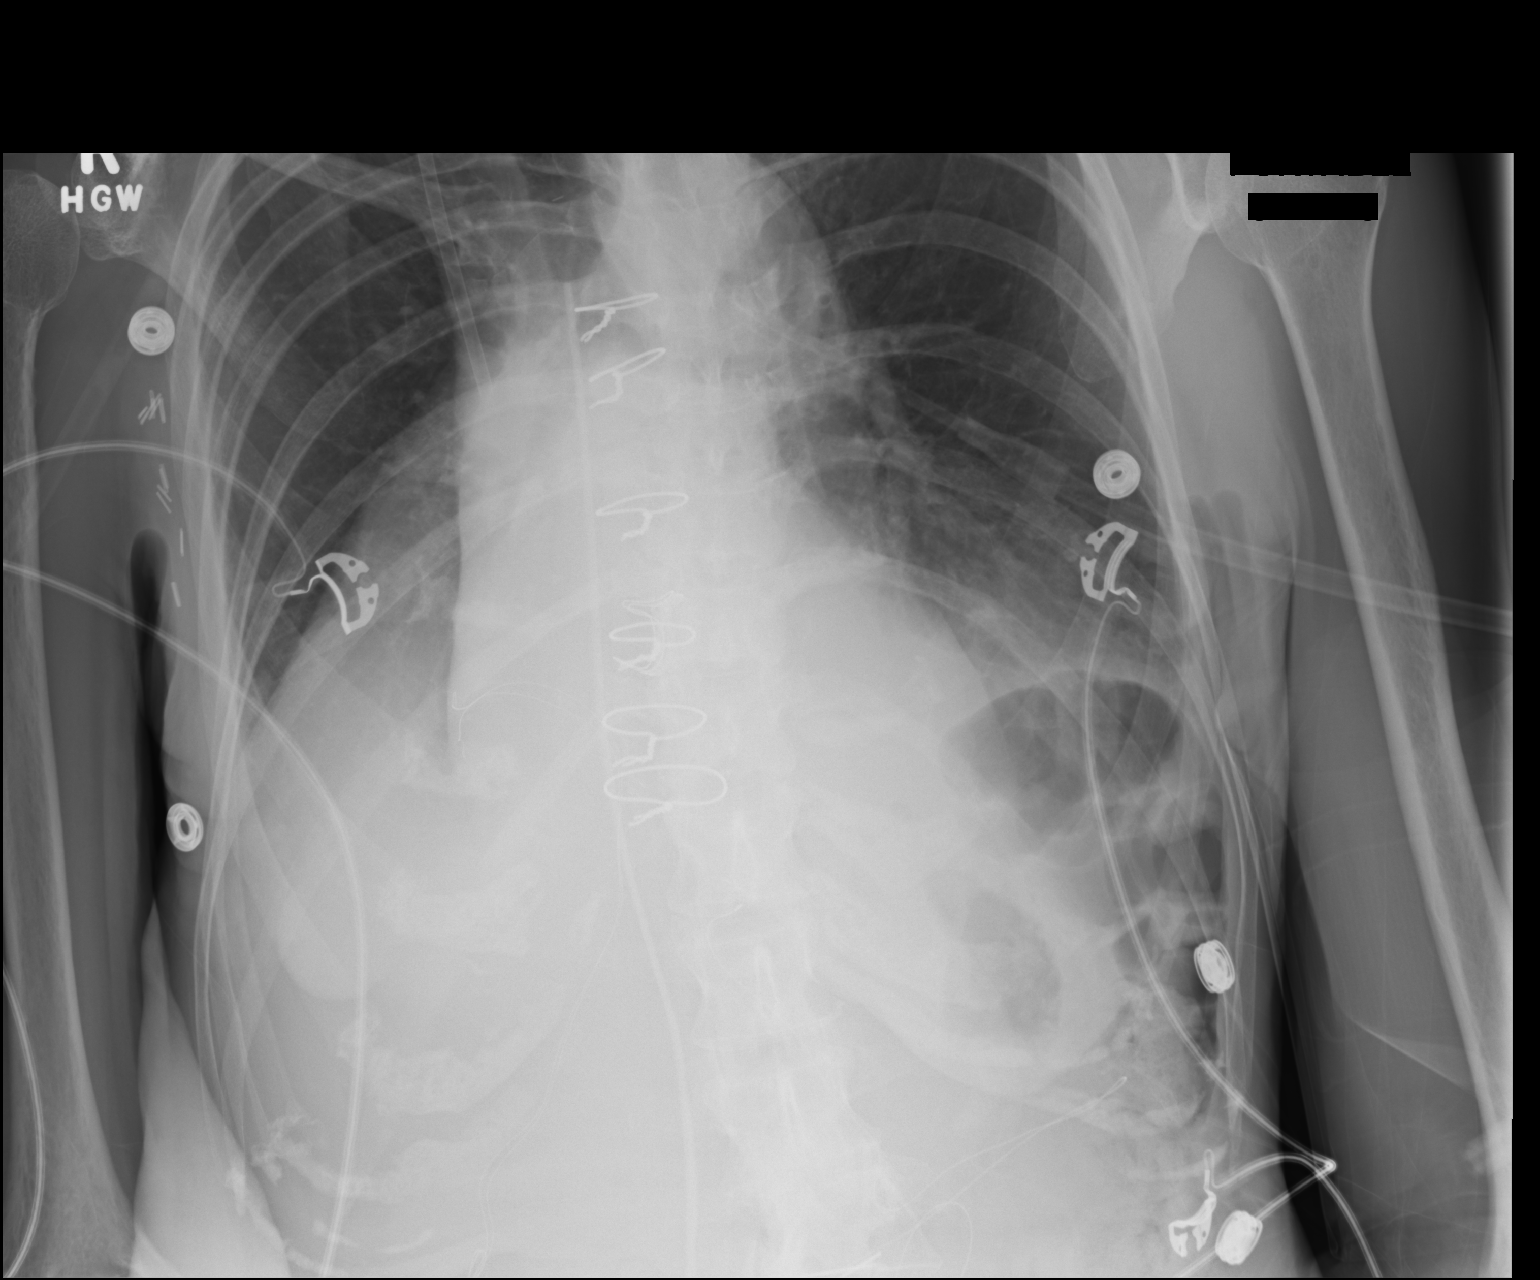

[AP (2 of 2)]
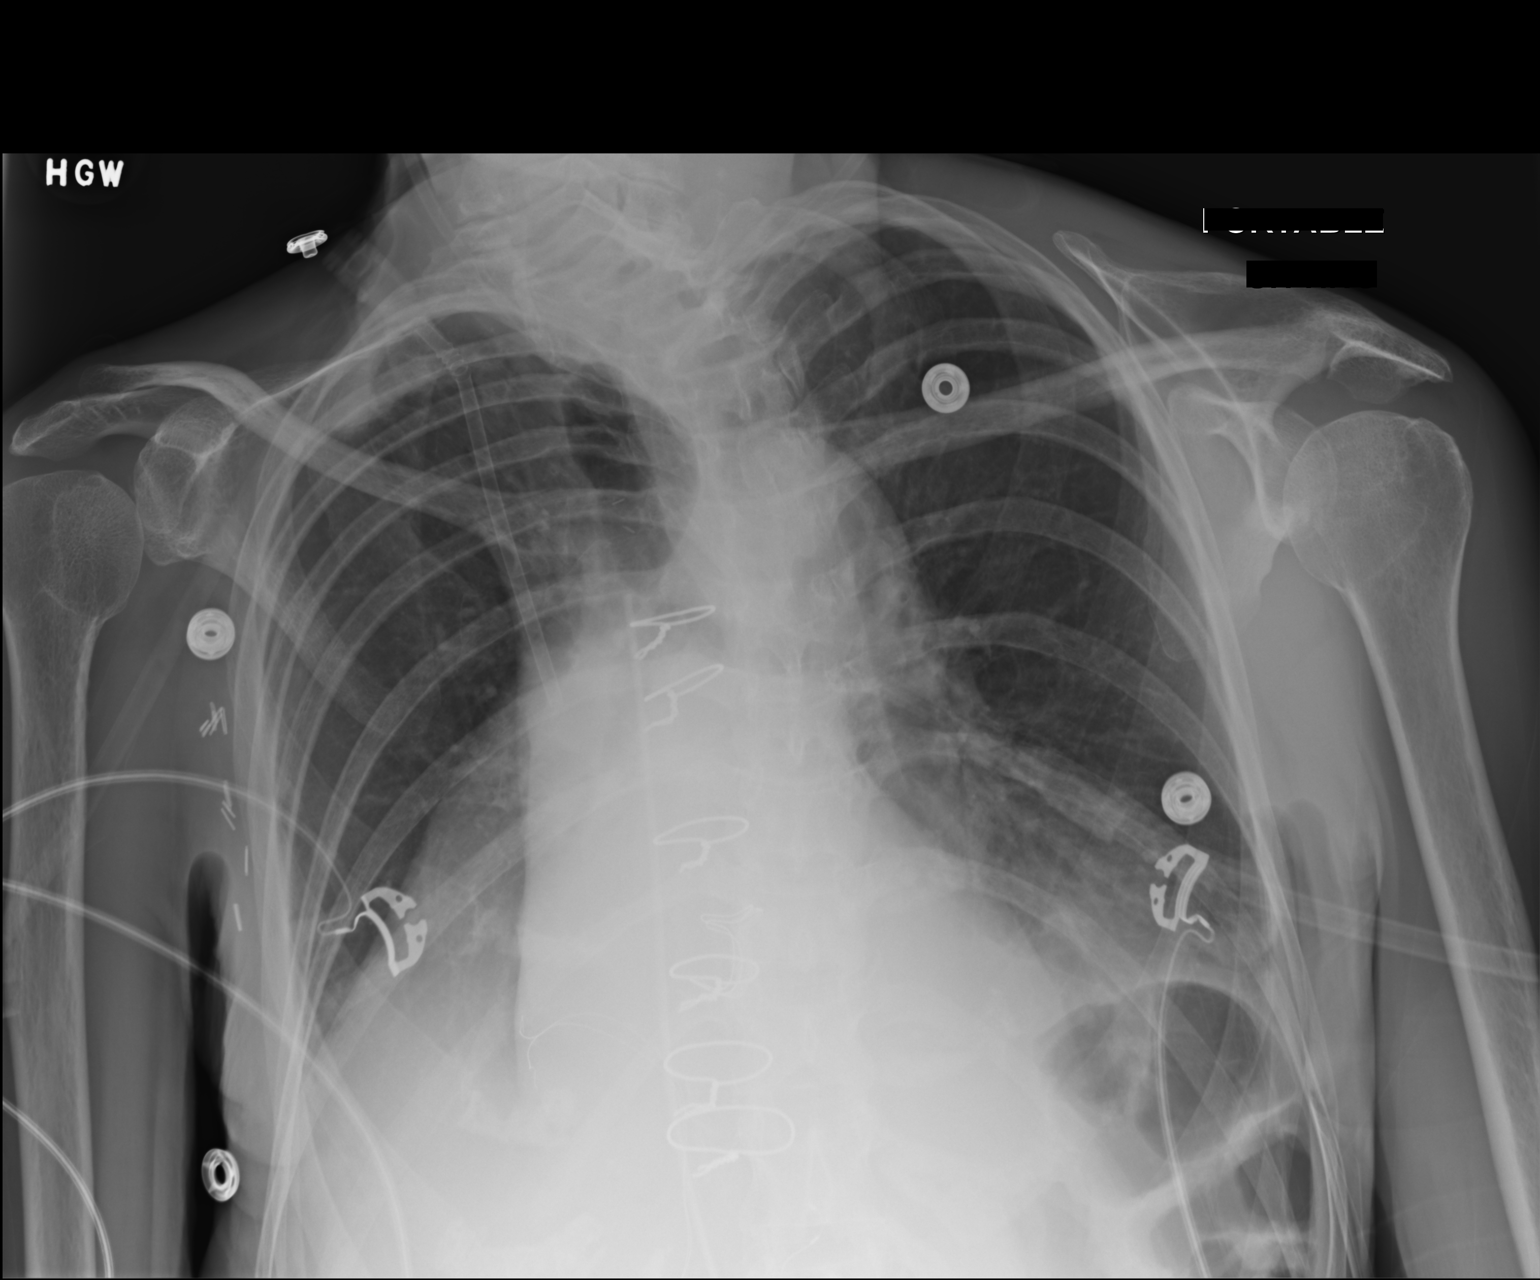

[2 of 2 positions shown; findings below may reference images not displayed]

FINDINGS: Right-sided Swan-Ganz catheter has been removed, with a Cordis
sheath remaining in place. Mediastinal drain remains. Patient
rotated to the right. Normal heart size. Probable small left pleural
effusion. No pneumothorax. Right apical opacity again identified as
detailed on yesterday's radiograph. No congestive failure. Worsening
bibasilar airspace disease. Surgical clips project over the right
axilla.
IMPRESSION: Worsening bibasilar aeration, favoring atelectasis.

No congestive failure or pneumothorax.

## 2017-10-16 IMAGING — CR DG CHEST 1V PORT
1 series · 1 of 1 positions shown · non-contrast
Comparison: One day prior

CLINICAL DATA: 09-30-15 s/p AVR

EXAM:
PORTABLE CHEST 1 VIEW

[AP]
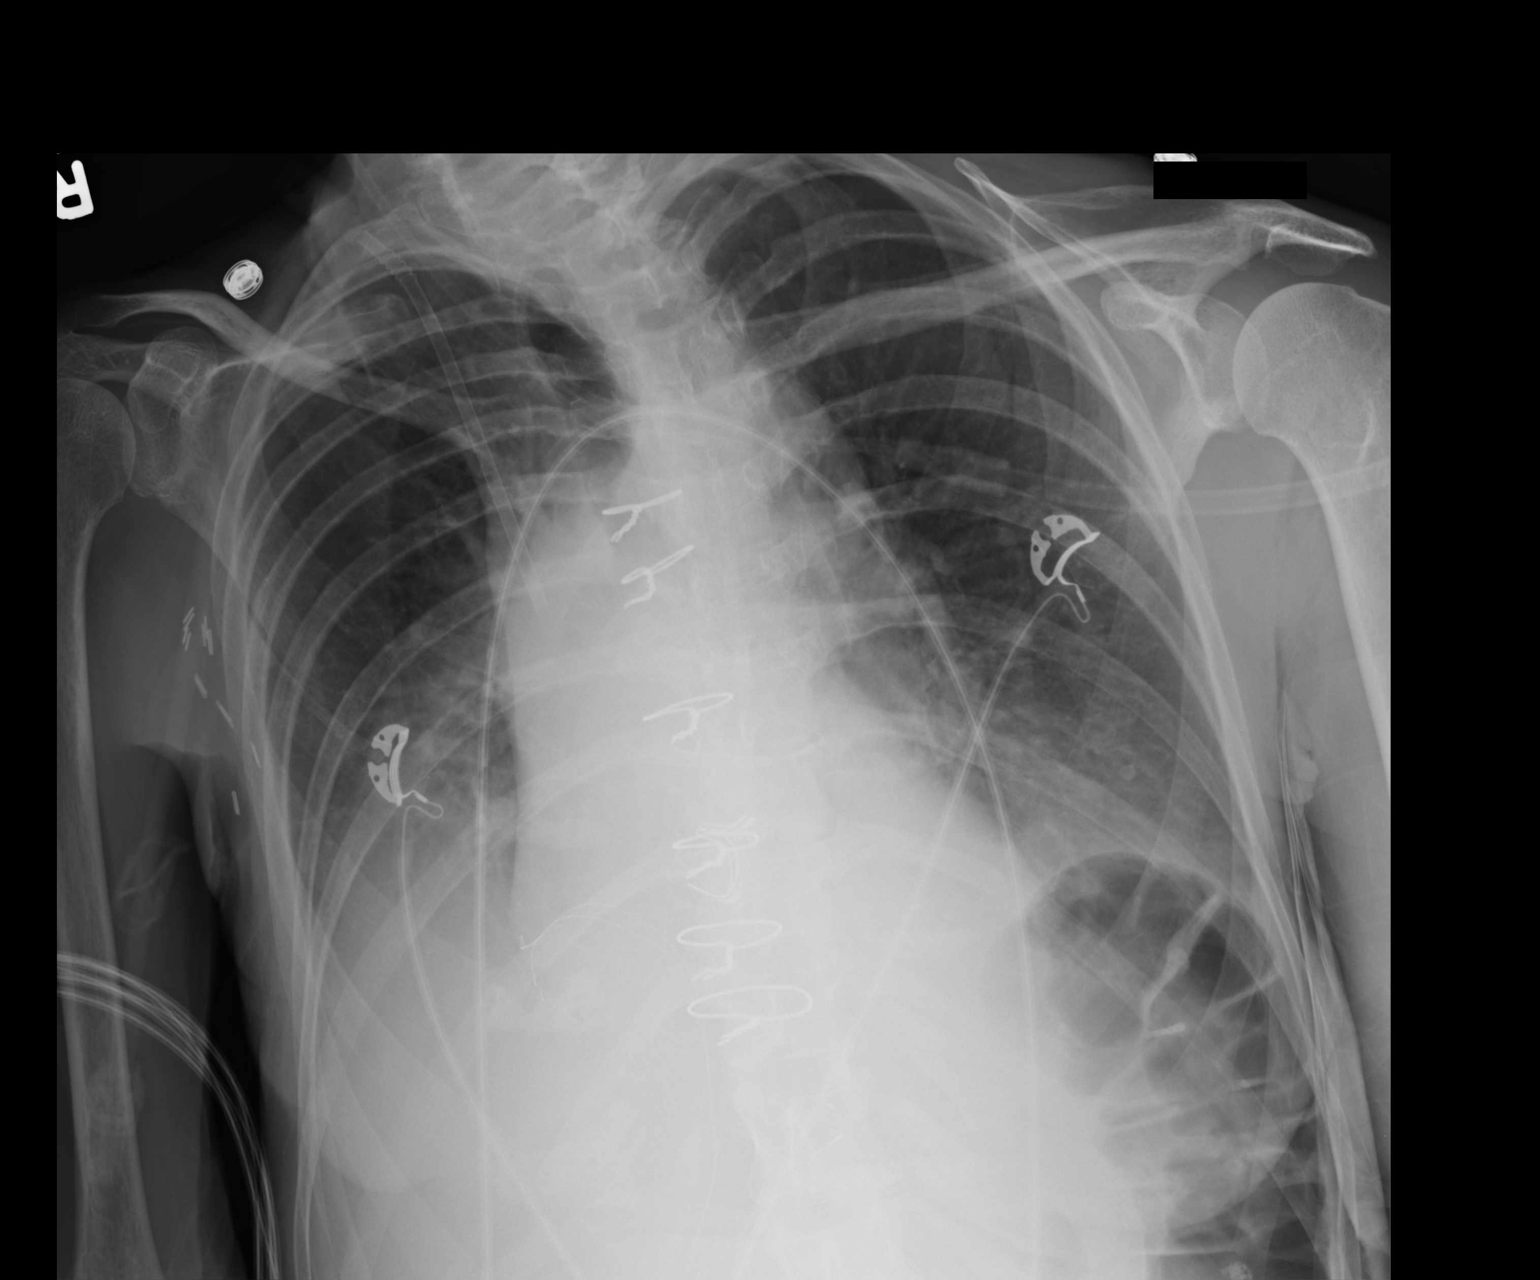

[1 of 1 positions shown; findings below may reference images not displayed]

FINDINGS: Patient rotated right. Right IJ Cordis sheath. Removal of
mediastinal drain or femoral line. Prior median sternotomy. Normal
heart size. Suspect small bilateral pleural effusions layering
posteriorly. No pneumothorax. No congestive failure. Persistent
bibasilar airspace disease, similar given differences in technique.
IMPRESSION: No significant change since one day prior.

Small bilateral pleural effusions with bibasilar Airspace disease,
likely atelectasis.

## 2017-11-01 DIAGNOSIS — M47816 Spondylosis without myelopathy or radiculopathy, lumbar region: Secondary | ICD-10-CM | POA: Diagnosis not present

## 2017-11-01 DIAGNOSIS — M4120 Other idiopathic scoliosis, site unspecified: Secondary | ICD-10-CM | POA: Diagnosis not present

## 2018-01-01 DIAGNOSIS — H524 Presbyopia: Secondary | ICD-10-CM | POA: Diagnosis not present

## 2018-01-01 DIAGNOSIS — H40013 Open angle with borderline findings, low risk, bilateral: Secondary | ICD-10-CM | POA: Diagnosis not present

## 2018-01-09 DIAGNOSIS — Z1331 Encounter for screening for depression: Secondary | ICD-10-CM | POA: Diagnosis not present

## 2018-01-09 DIAGNOSIS — G43009 Migraine without aura, not intractable, without status migrainosus: Secondary | ICD-10-CM | POA: Diagnosis not present

## 2018-01-09 DIAGNOSIS — F418 Other specified anxiety disorders: Secondary | ICD-10-CM | POA: Diagnosis not present

## 2018-01-09 DIAGNOSIS — Z952 Presence of prosthetic heart valve: Secondary | ICD-10-CM | POA: Diagnosis not present

## 2018-01-09 DIAGNOSIS — Z6823 Body mass index (BMI) 23.0-23.9, adult: Secondary | ICD-10-CM | POA: Diagnosis not present

## 2018-01-09 DIAGNOSIS — E7800 Pure hypercholesterolemia, unspecified: Secondary | ICD-10-CM | POA: Diagnosis not present

## 2018-01-09 DIAGNOSIS — D509 Iron deficiency anemia, unspecified: Secondary | ICD-10-CM | POA: Diagnosis not present

## 2018-01-11 ENCOUNTER — Telehealth: Payer: Self-pay | Admitting: *Deleted

## 2018-01-11 DIAGNOSIS — Z139 Encounter for screening, unspecified: Secondary | ICD-10-CM | POA: Diagnosis not present

## 2018-01-11 DIAGNOSIS — Z136 Encounter for screening for cardiovascular disorders: Secondary | ICD-10-CM | POA: Diagnosis not present

## 2018-01-11 DIAGNOSIS — E785 Hyperlipidemia, unspecified: Secondary | ICD-10-CM | POA: Diagnosis not present

## 2018-01-11 DIAGNOSIS — Z0000 Encounter for general adult medical examination without abnormal findings: Secondary | ICD-10-CM | POA: Diagnosis not present

## 2018-01-11 MED ORDER — ROSUVASTATIN CALCIUM 20 MG PO TABS: 20.0000 mg | ORAL_TABLET | Freq: Every day | ORAL | 3 refills | Status: DC

## 2018-01-11 NOTE — Telephone Encounter (Signed)
-----   Message from Liliane Shi, [place] sent at 01/11/2018  5:18 PM EST ----- Please call patient Her LDL was 196 which is quite high.  I reviewed this with our clinical pharmacist.  We think it is best that she start statin therapy to reduce future risk. PLAN: 1.  Start Crestor 20 mg daily 2.  Check lipids and LFTs in 12 weeks. Richardson Dopp, PA-C 01/11/2018 5:17 PM

## 2018-01-11 NOTE — Telephone Encounter (Signed)
Pt notified of lab results. Pt is agreeable to start Crestor 20 mg daily. Pt said she will have lab work done with PCP in 3 months. I will fax lab results with recommendation for repeat Fasting Lipid and Liver panel to be done in 3 months with results to be faxed to [place], [place]. Pt also made her appt to see Dr. Burt Knack 04/29/18  Per recall in epic. Pt thanked me for my call and my help tonight.

## 2018-01-28 DIAGNOSIS — M4120 Other idiopathic scoliosis, site unspecified: Secondary | ICD-10-CM | POA: Diagnosis not present

## 2018-01-28 DIAGNOSIS — M47816 Spondylosis without myelopathy or radiculopathy, lumbar region: Secondary | ICD-10-CM | POA: Diagnosis not present

## 2018-02-01 DIAGNOSIS — D509 Iron deficiency anemia, unspecified: Secondary | ICD-10-CM | POA: Diagnosis not present

## 2018-02-18 DIAGNOSIS — H401132 Primary open-angle glaucoma, bilateral, moderate stage: Secondary | ICD-10-CM | POA: Diagnosis not present

## 2018-03-18 DIAGNOSIS — Z1231 Encounter for screening mammogram for malignant neoplasm of breast: Secondary | ICD-10-CM | POA: Diagnosis not present

## 2018-04-01 DIAGNOSIS — N6011 Diffuse cystic mastopathy of right breast: Secondary | ICD-10-CM | POA: Diagnosis not present

## 2018-04-01 DIAGNOSIS — Z1231 Encounter for screening mammogram for malignant neoplasm of breast: Secondary | ICD-10-CM | POA: Diagnosis not present

## 2018-04-01 DIAGNOSIS — Z853 Personal history of malignant neoplasm of breast: Secondary | ICD-10-CM | POA: Diagnosis not present

## 2018-04-01 DIAGNOSIS — N6012 Diffuse cystic mastopathy of left breast: Secondary | ICD-10-CM | POA: Diagnosis not present

## 2018-04-24 DIAGNOSIS — M5136 Other intervertebral disc degeneration, lumbar region: Secondary | ICD-10-CM | POA: Diagnosis not present

## 2018-04-24 DIAGNOSIS — M62838 Other muscle spasm: Secondary | ICD-10-CM | POA: Diagnosis not present

## 2018-04-24 DIAGNOSIS — M4120 Other idiopathic scoliosis, site unspecified: Secondary | ICD-10-CM | POA: Diagnosis not present

## 2018-04-24 DIAGNOSIS — M47816 Spondylosis without myelopathy or radiculopathy, lumbar region: Secondary | ICD-10-CM | POA: Diagnosis not present

## 2018-04-29 ENCOUNTER — Encounter: Payer: Self-pay | Admitting: Cardiovascular Disease

## 2018-04-29 ENCOUNTER — Ambulatory Visit: Payer: PPO | Admitting: Cardiovascular Disease

## 2018-04-29 VITALS — BP 150/92 | HR 59 | Ht 62.0 in | Wt 126.6 lb

## 2018-04-29 DIAGNOSIS — E782 Mixed hyperlipidemia: Secondary | ICD-10-CM

## 2018-04-29 DIAGNOSIS — Z952 Presence of prosthetic heart valve: Secondary | ICD-10-CM

## 2018-04-29 DIAGNOSIS — I10 Essential (primary) hypertension: Secondary | ICD-10-CM

## 2018-04-29 LAB — HEPATIC FUNCTION PANEL
ALBUMIN: 4.5 g/dL (ref 3.6–4.8)
ALK PHOS: 49 IU/L (ref 39–117)
ALT: 18 IU/L (ref 0–32)
AST: 18 IU/L (ref 0–40)
BILIRUBIN, DIRECT: 0.06 mg/dL (ref 0.00–0.40)
Total Protein: 6.7 g/dL (ref 6.0–8.5)

## 2018-04-29 LAB — LIPID PANEL
CHOL/HDL RATIO: 2.2 ratio (ref 0.0–4.4)
Cholesterol, Total: 189 mg/dL (ref 100–199)
HDL: 86 mg/dL (ref >39)
LDL Calculated: 88 mg/dL (ref 0–99)
Triglycerides: 77 mg/dL (ref 0–149)
VLDL Cholesterol Cal: 15 mg/dL (ref 5–40)

## 2018-04-29 MED ORDER — AMLODIPINE BESYLATE 5 MG PO TABS: 5.0000 mg | ORAL_TABLET | Freq: Every day | ORAL | 3 refills | Status: DC

## 2018-04-29 NOTE — Patient Instructions (Signed)
Medication Instructions:  1) START AMLODIPINE 5 mg daily  Labwork: TODAY: Lipid/Liver  Testing/Procedures: Your provider has requested that you have an echocardiogram. Echocardiography is a painless test that uses sound waves to create images of your heart. It provides your doctor with information about the size and shape of your heart and how well your heart's chambers and valves are working. This procedure takes approximately one hour. There are no restrictions for this procedure.  Follow-Up: Your provider wants you to follow-up in: 1 year with Dr. Burt Knack. You will receive a reminder letter in the mail two months in advance. If you don't receive a letter, please call our office to schedule the follow-up appointment.    Any Other Special Instructions Will Be Listed Below (If Applicable).     If you need a refill on your cardiac medications before your next appointment, please call your pharmacy.

## 2018-04-29 NOTE — Progress Notes (Signed)
Cardiology Office Note Date:  04/29/2018   ID:  Stephanie Velasquez, DOB 08-18-1956, MRN [id number]  PCP:  Cyndi Bender, PA-C  Cardiologist:  Sherren Mocha, MD    Chief Complaint  Patient presents with  . Follow-up    Aortic valve disease     History of Present Illness: Stephanie Velasquez is a 62 y.o. female who presents for follow-up of aortic valve disease.  The patient has a history of severe bicuspid valve aortic stenosis and she underwent bioprosthetic aortic valve replacement with that 23 mm magna ease tissue valve in October 2016. Postoperative echo demonstrated normal function of her bioprosthesis  The patient is here with her husband today.  She is having a lot of problems with her back.  She has no cardiopulmonary limitation.  She specifically denies chest pain, shortness of breath, leg swelling, orthopnea, or PND.  She has had no heart palpitations, lightheadedness, or syncope.  She has had more frequent elevation of blood pressure lately as she has been dealing with back pain.   Past Medical History:  Diagnosis Date  . Anxiety and depression   . Aortic valve disease    a. s/p AVR 11/16;  b. Echo 11/16: mild LVH, vigorous LVF, no RWMA, bioprosthetic AVR ok withmean 13 mmHg and trivial AI, trivial effusion  . Breast cancer [facility])    Dr Bobby Rumpf Dr Rogelia Boga, right side 2011  . Carotid stenosis    a. Carotid US 10/16: bilateral ICA 1-39%  . Diverticulosis   . GERD (gastroesophageal reflux disease)    Nexium  . Hearing difficulty of left ear   . History of cardiac catheterization    a. LHC 9/16: normal coronary arteries with very large RCA  . History of migraine headaches   . Hypercholesterolemia    Simvastatin  . Hypertension   . Iron deficiency anemia   . Osteoarthritis    hands  . Overactive bladder   . Paralysis of right upper extremity ([place])   . PUD (peptic ulcer disease)   . Raynaud's syndrome   . Spinal stenosis     Past Surgical History:  Procedure  Laterality Date  . AORTIC VALVE REPLACEMENT N/A 09/30/2015   Procedure: AORTIC VALVE REPLACEMENT (AVR);  Surgeon: Ivin Poot, MD;  Location: [place];  Service: Open Heart Surgery;  Laterality: N/A;  . APPENDECTOMY    . BREAST LUMPECTOMY WITH AXILLARY LYMPH NODE BIOPSY Right 2011  . BREAST SURGERY  2011, 2014   right side x 2  . CARDIAC CATHETERIZATION N/A 09/03/2015   Procedure: Right/Left Heart Cath and Coronary Angiography;  Surgeon: Jolaine Artist, MD;  Location: [place] CV LAB;  Service: Cardiovascular;  Laterality: N/A;  . CESAREAN SECTION     x 2  . COLONOSCOPY  2014  . ESOPHAGOGASTRODUODENOSCOPY    . TEE WITHOUT CARDIOVERSION N/A 09/30/2015   Procedure: TRANSESOPHAGEAL ECHOCARDIOGRAM (TEE);  Surgeon: Ivin Poot, MD;  Location: [place];  Service: Open Heart Surgery;  Laterality: N/A;  . TONSILLECTOMY    . TRIGGER FINGER RELEASE    . TUBAL LIGATION      Current Outpatient Medications  Medication Sig Dispense Refill  . aspirin 81 MG EC tablet Take 1 tablet (81 mg total) by mouth daily.    . Aspirin-Acetaminophen-Caffeine (GOODY HEADACHE PO) Take 1 each by mouth 2 (two) times daily as needed (PAIN).    Marland Kitchen buPROPion (WELLBUTRIN XL) 150 MG 24 hr tablet Take 1 tablet by mouth daily.    Marland Kitchen esomeprazole (  NEXIUM) 40 MG capsule Take 40 mg by mouth daily at 12 noon.    . ferrous sulfate 325 (65 FE) MG tablet Take 1 tablet by mouth daily as needed.    Marland Kitchen oxyCODONE-acetaminophen (PERCOCET) 7.5-325 MG per tablet Take 1 tablet by mouth every 8 (eight) hours as needed for severe pain.    . promethazine (PHENERGAN) 25 MG tablet Take 25 mg by mouth every 6 (six) hours as needed for nausea or vomiting.    . SUMAtriptan (IMITREX) 100 MG tablet Take 100 mg by mouth every 2 (two) hours as needed for migraine. May repeat in 2 hours if headache persists or recurs.    Marland Kitchen amLODipine (NORVASC) 5 MG tablet Take 1 tablet (5 mg total) by mouth daily. 90 tablet 3  . rosuvastatin (CRESTOR) 20 MG tablet  Take 1 tablet (20 mg total) by mouth daily. 90 tablet 3   No current facility-administered medications for this visit.     Allergies:   Naproxen   Social History:  The patient  reports that she quit smoking about 2 years ago. Her smoking use included cigarettes. She smoked 0.50 packs per day. She has never used smokeless tobacco. She reports that she does not drink alcohol or use drugs.   Family History:  The patient's family history includes COPD in her brother and father; Heart disease in her brother, father, and sister; Hyperlipidemia in her brother, father, and sister; Hypertension in her brother, father, mother, and sister.    ROS:  Please see the history of present illness.  All other systems are reviewed and negative.    PHYSICAL EXAM: VS:  BP (!) 150/92   Pulse (!) 59   Ht 5\' 2"  (1.575 m)   Wt 126 lb 9.6 oz (57.4 kg)   BMI 23.16 kg/m  , BMI Body mass index is 23.16 kg/m. GEN: Well nourished, well developed, in no acute distress  HEENT: normal  Neck: no JVD, no masses. No carotid bruits Cardiac: RRR with 2/6 early systolic ejection murmur at the right upper sternal border and 2/6 diastolic decrescendo murmur at the left lower sternal border        Respiratory:  clear to auscultation bilaterally, normal work of breathing GI: soft, nontender, nondistended, + BS MS: no deformity or atrophy  Ext: no pretibial edema, pedal pulses 2+= bilaterally. Right arm lymphedema is unchanged, chronic. Skin: warm and dry, no rash Neuro:  Strength and sensation are intact Psych: euthymic mood, full affect  EKG:  EKG is ordered today. The ekg ordered today shows sinus rhythm 59 bpm, age-indeterminate septal infarct, nonspecific T wave flattening.  Recent Labs: No results found for requested labs within last 8760 hours.   Lipid Panel  No results found for: CHOL, TRIG, HDL, CHOLHDL, VLDL, LDLCALC, LDLDIRECT    Wt Readings from Last 3 Encounters:  04/29/18 126 lb 9.6 oz (57.4 kg)    04/18/17 117 lb 12.8 oz (53.4 kg)  10/19/16 118 lb 12.8 oz (53.9 kg)     Cardiac Studies Reviewed: Cardiac Cath 09/03/2015: Conclusion    RA = 4 RV = 36/1/6 PA = 32/10 (20) PCW = 12 Fick cardiac output/index = 3.4/2.4 PVR = 2.4 WU FA sat = 90% PA sat = 53%, 55% Ao Pressure: 139/76 (100)  Assessment:  1. Normal coronaries with very large RCA  2. Normal pressures 3. Mildly depressed cardiac output in the setting of severe AS  Plan:  Proceed with AVR with Dr. Prescott Gum.  Echo 11-16-2015: Study Conclusions  - Left ventricle: The cavity size was normal. There was mild   concentric hypertrophy. Systolic function was vigorous. The   estimated ejection fraction was in the range of 65% to 70%. Wall   motion was normal; there were no regional wall motion   abnormalities. Left ventricular diastolic function parameters   were normal. - Aortic valve: A bioprosthesis was present. There was trivial   regurgitation. Valve area (VTI): 1.5 cm^2. Valve area (Vmax):   1.25 cm^2. Valve area (Vmean): 1.38 cm^2. - Pericardium, extracardiac: A trivial pericardial effusion was   identified.  ASSESSMENT AND PLAN: 1.  Aortic valve disease status post bioprosthetic aortic valve replacement: The patient appears to be asymptomatic.  She understands the lifelong indication for SBE prophylaxis. She has an audible diastolic murmur on exam and I do not appreciate this in the past.  Will update her echocardiogram.  2.  Hypertension: Blood pressure control is suboptimal.  Add  amlodipine 5 mg daily.  3.  Hyperlipidemia: Her LDL cholesterol when last checked was 196.  She was started on Crestor 20 mg and she is due for follow-up lipids and LFTs today.  Current medicines are reviewed with the patient today.  The patient does not have concerns regarding medicines.  Labs/ tests ordered today include:   Orders Placed This Encounter  Procedures  . Hepatic function panel  . Lipid panel  .  EKG 12-Lead  . ECHOCARDIOGRAM COMPLETE    Disposition:   FU one year  Signed, Sherren Mocha, MD  04/29/2018 1:53 PM    [facility] [place], [place], [place]  [address] Phone: [phone]; Fax: [phone]

## 2018-05-07 ENCOUNTER — Other Ambulatory Visit: Payer: Self-pay

## 2018-05-07 ENCOUNTER — Ambulatory Visit (HOSPITAL_COMMUNITY): Payer: PPO | Attending: Cardiology

## 2018-05-07 DIAGNOSIS — E785 Hyperlipidemia, unspecified: Secondary | ICD-10-CM | POA: Insufficient documentation

## 2018-05-07 DIAGNOSIS — Z87891 Personal history of nicotine dependence: Secondary | ICD-10-CM | POA: Diagnosis not present

## 2018-05-07 DIAGNOSIS — Z952 Presence of prosthetic heart valve: Secondary | ICD-10-CM

## 2018-05-07 DIAGNOSIS — Z953 Presence of xenogenic heart valve: Secondary | ICD-10-CM | POA: Insufficient documentation

## 2018-05-07 DIAGNOSIS — I10 Essential (primary) hypertension: Secondary | ICD-10-CM | POA: Diagnosis not present

## 2018-05-07 DIAGNOSIS — Z853 Personal history of malignant neoplasm of breast: Secondary | ICD-10-CM | POA: Diagnosis not present

## 2018-05-07 DIAGNOSIS — I059 Rheumatic mitral valve disease, unspecified: Secondary | ICD-10-CM | POA: Diagnosis not present

## 2018-05-07 DIAGNOSIS — Z8249 Family history of ischemic heart disease and other diseases of the circulatory system: Secondary | ICD-10-CM | POA: Insufficient documentation

## 2018-05-07 DIAGNOSIS — I359 Nonrheumatic aortic valve disorder, unspecified: Secondary | ICD-10-CM | POA: Diagnosis present

## 2018-07-10 DIAGNOSIS — D509 Iron deficiency anemia, unspecified: Secondary | ICD-10-CM | POA: Diagnosis not present

## 2018-07-10 DIAGNOSIS — F418 Other specified anxiety disorders: Secondary | ICD-10-CM | POA: Diagnosis not present

## 2018-07-10 DIAGNOSIS — Z6825 Body mass index (BMI) 25.0-25.9, adult: Secondary | ICD-10-CM | POA: Diagnosis not present

## 2018-07-10 DIAGNOSIS — Z952 Presence of prosthetic heart valve: Secondary | ICD-10-CM | POA: Diagnosis not present

## 2018-07-10 DIAGNOSIS — Z1339 Encounter for screening examination for other mental health and behavioral disorders: Secondary | ICD-10-CM | POA: Diagnosis not present

## 2018-07-10 DIAGNOSIS — I10 Essential (primary) hypertension: Secondary | ICD-10-CM | POA: Diagnosis not present

## 2018-07-10 DIAGNOSIS — G43009 Migraine without aura, not intractable, without status migrainosus: Secondary | ICD-10-CM | POA: Diagnosis not present

## 2018-07-10 DIAGNOSIS — E7800 Pure hypercholesterolemia, unspecified: Secondary | ICD-10-CM | POA: Diagnosis not present

## 2018-07-24 DIAGNOSIS — M5136 Other intervertebral disc degeneration, lumbar region: Secondary | ICD-10-CM | POA: Diagnosis not present

## 2018-07-24 DIAGNOSIS — I10 Essential (primary) hypertension: Secondary | ICD-10-CM | POA: Diagnosis not present

## 2018-07-24 DIAGNOSIS — M4120 Other idiopathic scoliosis, site unspecified: Secondary | ICD-10-CM | POA: Diagnosis not present

## 2018-07-24 DIAGNOSIS — M62838 Other muscle spasm: Secondary | ICD-10-CM | POA: Diagnosis not present

## 2018-08-22 DIAGNOSIS — C44311 Basal cell carcinoma of skin of nose: Secondary | ICD-10-CM | POA: Diagnosis not present

## 2018-10-10 DIAGNOSIS — M47816 Spondylosis without myelopathy or radiculopathy, lumbar region: Secondary | ICD-10-CM | POA: Diagnosis not present

## 2018-10-10 DIAGNOSIS — M62838 Other muscle spasm: Secondary | ICD-10-CM | POA: Diagnosis not present

## 2018-10-10 DIAGNOSIS — M4120 Other idiopathic scoliosis, site unspecified: Secondary | ICD-10-CM | POA: Diagnosis not present

## 2018-10-10 DIAGNOSIS — I10 Essential (primary) hypertension: Secondary | ICD-10-CM | POA: Diagnosis not present

## 2019-01-06 DIAGNOSIS — M47816 Spondylosis without myelopathy or radiculopathy, lumbar region: Secondary | ICD-10-CM | POA: Diagnosis not present

## 2019-01-06 DIAGNOSIS — Z6825 Body mass index (BMI) 25.0-25.9, adult: Secondary | ICD-10-CM | POA: Diagnosis not present

## 2019-01-06 DIAGNOSIS — I10 Essential (primary) hypertension: Secondary | ICD-10-CM | POA: Diagnosis not present

## 2019-01-06 DIAGNOSIS — M62838 Other muscle spasm: Secondary | ICD-10-CM | POA: Diagnosis not present

## 2019-01-20 DIAGNOSIS — F418 Other specified anxiety disorders: Secondary | ICD-10-CM | POA: Diagnosis not present

## 2019-01-20 DIAGNOSIS — Z1339 Encounter for screening examination for other mental health and behavioral disorders: Secondary | ICD-10-CM | POA: Diagnosis not present

## 2019-01-20 DIAGNOSIS — Z952 Presence of prosthetic heart valve: Secondary | ICD-10-CM | POA: Diagnosis not present

## 2019-01-20 DIAGNOSIS — E7800 Pure hypercholesterolemia, unspecified: Secondary | ICD-10-CM | POA: Diagnosis not present

## 2019-01-20 DIAGNOSIS — I10 Essential (primary) hypertension: Secondary | ICD-10-CM | POA: Diagnosis not present

## 2019-01-20 DIAGNOSIS — G43009 Migraine without aura, not intractable, without status migrainosus: Secondary | ICD-10-CM | POA: Diagnosis not present

## 2019-01-20 DIAGNOSIS — Z6825 Body mass index (BMI) 25.0-25.9, adult: Secondary | ICD-10-CM | POA: Diagnosis not present

## 2019-01-20 DIAGNOSIS — D509 Iron deficiency anemia, unspecified: Secondary | ICD-10-CM | POA: Diagnosis not present

## 2019-01-20 DIAGNOSIS — Z1331 Encounter for screening for depression: Secondary | ICD-10-CM | POA: Diagnosis not present

## 2019-01-21 DIAGNOSIS — H401132 Primary open-angle glaucoma, bilateral, moderate stage: Secondary | ICD-10-CM | POA: Diagnosis not present

## 2019-01-21 DIAGNOSIS — H52223 Regular astigmatism, bilateral: Secondary | ICD-10-CM | POA: Diagnosis not present

## 2019-03-14 ENCOUNTER — Other Ambulatory Visit: Payer: Self-pay | Admitting: Cardiovascular Disease

## 2019-03-27 DIAGNOSIS — M47816 Spondylosis without myelopathy or radiculopathy, lumbar region: Secondary | ICD-10-CM | POA: Diagnosis not present

## 2019-03-27 DIAGNOSIS — M62838 Other muscle spasm: Secondary | ICD-10-CM | POA: Diagnosis not present

## 2019-04-28 ENCOUNTER — Telehealth: Payer: Self-pay | Admitting: *Deleted

## 2019-04-28 NOTE — Telephone Encounter (Signed)
SPOKE WITH PT AND PT CONSENTED TO DOXY ME   Confirm consent - "In the setting of the current Covid19 crisis, you are scheduled for a (phone or video) visit with your provider on (date) at (time).  Just as we do with many in-office visits, in order for you to participate in this visit, we must obtain consent.  If you'd like, I can send this to your mychart (if signed up) or email for you to review.  Otherwise, I can obtain your verbal consent now.  All virtual visits are billed to your insurance company just like a normal visit would be.  By agreeing to a virtual visit, we'd like you to understand that the technology does not allow for your provider to perform an examination, and thus may limit your provider's ability to fully assess your condition. If your provider identifies any concerns that need to be evaluated in person, we will make arrangements to do so.  Finally, though the technology is pretty good, we cannot assure that it will always work on either your or our end, and in the setting of a video visit, we may have to convert it to a phone-only visit.  In either situation, we cannot ensure that we have a secure connection.  Are you willing to proceed?" STAFF: Did the patient verbally acknowledge consent to telehealth visit? Document YES/NO here: YES   TELEPHONE CALL NOTE  Stephanie Velasquez has been deemed a candidate for a follow-up tele-health visit to limit community exposure during the Covid-19 pandemic. I spoke with the patient via phone to ensure availability of phone/video source, confirm preferred email & phone number, and discuss instructions and expectations.  I reminded Stephanie Velasquez to be prepared with any vital sign and/or heart rhythm information that could potentially be obtained via home monitoring, at the time of her visit. I reminded Stephanie Velasquez to expect a phone call prior to her visit.  Claude Manges, CMA 04/28/2019 3:15 PM   IIF USING DOXIMITY or [email or web address].ME -  The patient will receive a link just prior to their visit by text.     FULL LENGTH CONSENT FOR TELE-HEALTH VISIT   I hereby voluntarily request, consent and authorize [place] and its employed or contracted physicians, physician assistants, nurse practitioners or other licensed health care professionals (the Practitioner), to provide me with telemedicine health care services (the "Services") as deemed necessary by the treating Practitioner. I acknowledge and consent to receive the Services by the Practitioner via telemedicine. I understand that the telemedicine visit will involve communicating with the Practitioner through live audiovisual communication technology and the disclosure of certain medical information by electronic transmission. I acknowledge that I have been given the opportunity to request an in-person assessment or other available alternative prior to the telemedicine visit and am voluntarily participating in the telemedicine visit.  I understand that I have the right to withhold or withdraw my consent to the use of telemedicine in the course of my care at any time, without affecting my right to future care or treatment, and that the Practitioner or I may terminate the telemedicine visit at any time. I understand that I have the right to inspect all information obtained and/or recorded in the course of the telemedicine visit and may receive copies of available information for a reasonable fee.  I understand that some of the potential risks of receiving the Services via telemedicine include:  Marland Kitchen Delay or interruption in medical evaluation due to technological equipment failure or  disruption; . Information transmitted may not be sufficient (e.g. poor resolution of images) to allow for appropriate medical decision making by the Practitioner; and/or  . In rare instances, security protocols could fail, causing a breach of personal health information.  Furthermore, I acknowledge that it  is my responsibility to provide information about my medical history, conditions and care that is complete and accurate to the best of my ability. I acknowledge that Practitioner's advice, recommendations, and/or decision may be based on factors not within their control, such as incomplete or inaccurate data provided by me or distortions of diagnostic images or specimens that may result from electronic transmissions. I understand that the practice of medicine is not an exact science and that Practitioner makes no warranties or guarantees regarding treatment outcomes. I acknowledge that I will receive a copy of this consent concurrently upon execution via email to the email address I last provided but may also request a printed copy by calling the office of [facility].    I understand that my insurance will be billed for this visit.   I have read or had this consent read to me. . I understand the contents of this consent, which adequately explains the benefits and risks of the Services being provided via telemedicine.  . I have been provided ample opportunity to ask questions regarding this consent and the Services and have had my questions answered to my satisfaction. . I give my informed consent for the services to be provided through the use of telemedicine in my medical care  By participating in this telemedicine visit I agree to the above.

## 2019-04-29 DIAGNOSIS — I359 Nonrheumatic aortic valve disorder, unspecified: Secondary | ICD-10-CM | POA: Insufficient documentation

## 2019-04-29 NOTE — Progress Notes (Signed)
Virtual Visit via Video Note   This visit type was conducted due to national recommendations for restrictions regarding the COVID-19 Pandemic (e.g. social distancing) in an effort to limit this patient's exposure and mitigate transmission in our community.  Due to her co-morbid illnesses, this patient is at least at moderate risk for complications without adequate follow up.  This format is felt to be most appropriate for this patient at this time.  All issues noted in this document were discussed and addressed.  A limited physical exam was performed with this format.  Please refer to the patient's chart for her consent to telehealth for [facility].   Date:  04/30/2019   ID:  Stephanie Velasquez, DOB [birth year], MRN [id number]  Patient Location: Home Provider Location: Home  PCP:  Cyndi Bender, PA-C  Cardiologist:  Sherren Mocha, MD   Electrophysiologist:  None   Evaluation Performed:  Follow-Up Visit  Chief Complaint: Follow-up on aortic valve disease  History of Present Illness:    Stephanie Velasquez is a 63 y.o. female with bicuspid aortic valve with associated aortic stenosis status post bioprosthetic AVR in 10/16, spinal stenosis, PUD, HTN, HL, and deficiency anemia, tobacco abuse, COPD.    Today, she notes she is doing well.  She has not had chest discomfort or significant shortness of breath, orthopnea, paroxysmal trouble dyspnea or significant lower extremity swelling.  She does have occasional flutters in her chest.  These are quite brief and have largely remained unchanged.  She notes it more when she is tired.  She has occasional dizziness described as a spinning sensation but denies syncope.  The patient does not have symptoms concerning for COVID-19 infection (fever, chills, cough, or new shortness of breath).    Past Medical History:  Diagnosis Date  . Anxiety and depression   . Aortic valve disease    a. s/p AVR 11/16;  b. Echo 11/16: mild LVH, vigorous LVF, no  RWMA, bioprosthetic AVR ok withmean 13 mmHg and trivial AI, trivial effusion  . Breast cancer [facility])    Dr Bobby Rumpf Dr Rogelia Boga, right side 2011  . Carotid stenosis    a. Carotid US 10/16: bilateral ICA 1-39%  . Diverticulosis   . GERD (gastroesophageal reflux disease)    Nexium  . Hearing difficulty of left ear   . History of cardiac catheterization    a. LHC 9/16: normal coronary arteries with very large RCA  . History of migraine headaches   . Hypercholesterolemia    Simvastatin  . Hypertension   . Iron deficiency anemia   . Osteoarthritis    hands  . Overactive bladder   . Paralysis of right upper extremity ([place])   . PUD (peptic ulcer disease)   . Raynaud's syndrome   . Spinal stenosis    Past Surgical History:  Procedure Laterality Date  . AORTIC VALVE REPLACEMENT N/A 09/30/2015   Procedure: AORTIC VALVE REPLACEMENT (AVR);  Surgeon: Ivin Poot, MD;  Location: [place];  Service: Open Heart Surgery;  Laterality: N/A;  . APPENDECTOMY    . BREAST LUMPECTOMY WITH AXILLARY LYMPH NODE BIOPSY Right 2011  . BREAST SURGERY  2011, 2014   right side x 2  . CARDIAC CATHETERIZATION N/A 09/03/2015   Procedure: Right/Left Heart Cath and Coronary Angiography;  Surgeon: Jolaine Artist, MD;  Location: [place] CV LAB;  Service: Cardiovascular;  Laterality: N/A;  . CESAREAN SECTION     x 2  . COLONOSCOPY  2014  . ESOPHAGOGASTRODUODENOSCOPY    .  TEE WITHOUT CARDIOVERSION N/A 09/30/2015   Procedure: TRANSESOPHAGEAL ECHOCARDIOGRAM (TEE);  Surgeon: Ivin Poot, MD;  Location: [place];  Service: Open Heart Surgery;  Laterality: N/A;  . TONSILLECTOMY    . TRIGGER FINGER RELEASE    . TUBAL LIGATION       Current Meds  Medication Sig  . aspirin 81 MG EC tablet Take 1 tablet (81 mg total) by mouth daily.  . Aspirin-Acetaminophen-Caffeine (GOODY HEADACHE PO) Take 1 each by mouth 2 (two) times daily as needed (PAIN).  Marland Kitchen buPROPion (WELLBUTRIN XL) 300 MG 24 hr tablet Take 300 mg by mouth  daily.   Marland Kitchen esomeprazole (NEXIUM) 40 MG capsule Take 40 mg by mouth daily at 12 noon.  . ferrous sulfate 325 (65 FE) MG tablet Take 1 tablet by mouth daily.   Marland Kitchen morphine (MS CONTIN) 15 MG 12 hr tablet Take 15 mg by mouth every 12 (twelve) hours.   Marland Kitchen oxyCODONE-acetaminophen (PERCOCET) 7.5-325 MG per tablet Take 1 tablet by mouth every 8 (eight) hours as needed for severe pain.  . promethazine (PHENERGAN) 25 MG tablet Take 25 mg by mouth every 6 (six) hours as needed for nausea or vomiting.  . rosuvastatin (CRESTOR) 20 MG tablet Take 1 tablet (20 mg total) by mouth daily.  . SUMAtriptan (IMITREX) 100 MG tablet Take 100 mg by mouth every 2 (two) hours as needed for migraine. May repeat in 2 hours if headache persists or recurs.  . [DISCONTINUED] amLODipine (NORVASC) 5 MG tablet TAKE 1 TABLET BY MOUTH DAILY     Allergies:   Naproxen   Social History   Tobacco Use  . Smoking status: Former Smoker    Packs/day: 0.50    Types: Cigarettes    Last attempt to quit: 09/30/2015    Years since quitting: 3.5  . Smokeless tobacco: Never Used  Substance Use Topics  . Alcohol use: No    Alcohol/week: 0.0 standard drinks  . Drug use: No     Family Hx: The patient's family history includes COPD in her brother and father; Heart disease in her brother, father, and sister; Hyperlipidemia in her brother, father, and sister; Hypertension in her brother, father, mother, and sister.  ROS:   Please see the history of present illness.     All other systems reviewed and are negative.   Prior CV studies:   The following studies were reviewed today:  Echocardiogram 05/07/2018 EF 65-70, normal wall motion, bioprosthetic aortic valve with function (mean 16, peak 30), mild MAC, PASP 39  Echo 11/16 Mild LVH, vigorous LVF, EF 65-70%, normal wall motion, normal diastolic function, bioprosthetic AVR okay with trivial AI, trivial pericardial effusion  Labs/Other Tests and Data Reviewed:    EKG:  No ECG  reviewed.  Recent Labs: No results found for requested labs within last 8760 hours.   Recent Lipid Panel Lab Results  Component Value Date/Time   CHOL 189 04/29/2018 11:06 AM   TRIG 77 04/29/2018 11:06 AM   HDL 86 04/29/2018 11:06 AM   CHOLHDL 2.2 04/29/2018 11:06 AM   LDLCALC 88 04/29/2018 11:06 AM     Wt Readings from Last 3 Encounters:  04/30/19 133 lb (60.3 kg)  04/29/18 126 lb 9.6 oz (57.4 kg)  04/18/17 117 lb 12.8 oz (53.4 kg)     Objective:    Vital Signs:  BP (!) 146/84   Pulse 69   Ht _0  (1.575 m)   Wt 133 lb (60.3 kg)   BMI 24.33  kg/m    VITAL SIGNS:  reviewed GEN:  no acute distress RESPIRATORY:  Normal respiratory effort NEURO:  Alert and oriented PSYCH:  Seems to be in good spirits  ASSESSMENT & PLAN:    Aortic valve disease Status post bioprosthetic aortic valve replacement in 2016.  Follow-up echocardiogram demonstrated normally functioning aortic valve bioprosthesis in 2019.  She seems to be doing well overall.  Continue SBE prophylaxis.  Continue aspirin.  Chronic obstructive pulmonary disease, unspecified COPD type ([place]) She is no longer smoking and her breathing seems to be stable.  Palpitations She describes palpitations that sound consistent with PACs versus PVCs.  These have been stable for a long time without significant change.  I have asked her to continue to monitor this and let us know if these symptoms are changing.  We can certainly set her up for a cardiac monitor at that point.  Essential hypertension Blood pressure remains uncontrolled.  Increase amlodipine to 10 mg daily.  Continue to monitor.  COVID-19 Education: The signs and symptoms of COVID-19 were discussed with the patient and how to seek care for testing (follow up with PCP or arrange E-visit).  The importance of social distancing was discussed today.  Time:   Today, I have spent 11 minutes with the patient with telehealth technology discussing the above problems.      Medication Adjustments/Labs and Tests Ordered: Current medicines are reviewed at length with the patient today.  Concerns regarding medicines are outlined above.   Tests Ordered: No orders of the defined types were placed in this encounter.   Medication Changes: Meds ordered this encounter  Medications  . amLODipine (NORVASC) 10 MG tablet    Sig: Take 1 tablet (10 mg total) by mouth daily.    Dispense:  90 tablet    Refill:  3    Order Specific Question:   Supervising Provider    Answer:   Lelon Perla [[id number]]    Disposition:  Follow up in 1 year(s)  Signed, Richardson Dopp, PA-C  04/30/2019 11:51 AM    [place]

## 2019-04-30 ENCOUNTER — Telehealth (INDEPENDENT_AMBULATORY_CARE_PROVIDER_SITE_OTHER): Payer: PPO | Admitting: Physician Assistant

## 2019-04-30 ENCOUNTER — Other Ambulatory Visit: Payer: Self-pay

## 2019-04-30 ENCOUNTER — Encounter: Payer: Self-pay | Admitting: Physician Assistant

## 2019-04-30 VITALS — BP 146/84 | HR 69 | Ht 62.0 in | Wt 133.0 lb

## 2019-04-30 DIAGNOSIS — I359 Nonrheumatic aortic valve disorder, unspecified: Secondary | ICD-10-CM

## 2019-04-30 DIAGNOSIS — J449 Chronic obstructive pulmonary disease, unspecified: Secondary | ICD-10-CM

## 2019-04-30 DIAGNOSIS — I10 Essential (primary) hypertension: Secondary | ICD-10-CM

## 2019-04-30 DIAGNOSIS — R002 Palpitations: Secondary | ICD-10-CM

## 2019-04-30 DIAGNOSIS — Z7189 Other specified counseling: Secondary | ICD-10-CM

## 2019-04-30 MED ORDER — AMLODIPINE BESYLATE 10 MG PO TABS: 10.0000 mg | ORAL_TABLET | Freq: Every day | ORAL | 3 refills | Status: DC

## 2019-04-30 NOTE — Patient Instructions (Signed)
Medication Instructions:  Increase amlodipine to 10 mg daily.  You can take 2 of the 5 mg tablets and a new prescription for 10 mg tablets has been sent into your pharmacy.  If you need a refill on your cardiac medications before your next appointment, please call your pharmacy.   Lab work: None  If you have labs (blood work) drawn today and your tests are completely normal, you will receive your results only by: Marland Kitchen MyChart Message (if you have MyChart) OR . A paper copy in the mail If you have any lab test that is abnormal or we need to change your treatment, we will call you to review the results.  Testing/Procedures: None  Follow-Up: At [facility], you and your health needs are our priority.  As part of our continuing mission to provide you with exceptional heart care, we have created designated Provider Care Teams.  These Care Teams include your primary Cardiologist (physician) and Advanced Practice Providers (APPs -  Physician Assistants and Nurse Practitioners) who all work together to provide you with the care you need, when you need it. You will need a follow up appointment in:  12 months.  Please call our office 2 months in advance to schedule this appointment.  You may see Sherren Mocha, MD or Richardson Dopp, PA-C   Any Other Special Instructions Will Be Listed Below (If Applicable).  Continue to monitor blood pressure.  Let us know if it continues to run higher than 130/80.

## 2019-05-06 ENCOUNTER — Other Ambulatory Visit: Payer: Self-pay | Admitting: Physician Assistant

## 2019-06-24 DIAGNOSIS — Z1231 Encounter for screening mammogram for malignant neoplasm of breast: Secondary | ICD-10-CM | POA: Diagnosis not present

## 2019-06-26 DIAGNOSIS — I10 Essential (primary) hypertension: Secondary | ICD-10-CM | POA: Diagnosis not present

## 2019-06-26 DIAGNOSIS — M5416 Radiculopathy, lumbar region: Secondary | ICD-10-CM | POA: Diagnosis not present

## 2019-06-26 DIAGNOSIS — M5136 Other intervertebral disc degeneration, lumbar region: Secondary | ICD-10-CM | POA: Diagnosis not present

## 2019-06-26 DIAGNOSIS — M47816 Spondylosis without myelopathy or radiculopathy, lumbar region: Secondary | ICD-10-CM | POA: Diagnosis not present

## 2019-06-30 DIAGNOSIS — C50111 Malignant neoplasm of central portion of right female breast: Secondary | ICD-10-CM | POA: Diagnosis not present

## 2019-06-30 DIAGNOSIS — C773 Secondary and unspecified malignant neoplasm of axilla and upper limb lymph nodes: Secondary | ICD-10-CM | POA: Diagnosis not present

## 2019-06-30 DIAGNOSIS — Z170 Estrogen receptor positive status [ER+]: Secondary | ICD-10-CM | POA: Diagnosis not present

## 2019-07-09 DIAGNOSIS — H401132 Primary open-angle glaucoma, bilateral, moderate stage: Secondary | ICD-10-CM | POA: Diagnosis not present

## 2019-07-21 ENCOUNTER — Encounter: Payer: Self-pay | Admitting: Physician Assistant

## 2019-07-21 DIAGNOSIS — D509 Iron deficiency anemia, unspecified: Secondary | ICD-10-CM | POA: Diagnosis not present

## 2019-07-21 DIAGNOSIS — I10 Essential (primary) hypertension: Secondary | ICD-10-CM | POA: Diagnosis not present

## 2019-07-21 DIAGNOSIS — F418 Other specified anxiety disorders: Secondary | ICD-10-CM | POA: Diagnosis not present

## 2019-07-21 DIAGNOSIS — E7800 Pure hypercholesterolemia, unspecified: Secondary | ICD-10-CM | POA: Diagnosis not present

## 2019-07-21 DIAGNOSIS — Z952 Presence of prosthetic heart valve: Secondary | ICD-10-CM | POA: Diagnosis not present

## 2019-07-21 DIAGNOSIS — Z6826 Body mass index (BMI) 26.0-26.9, adult: Secondary | ICD-10-CM | POA: Diagnosis not present

## 2019-07-21 DIAGNOSIS — G43009 Migraine without aura, not intractable, without status migrainosus: Secondary | ICD-10-CM | POA: Diagnosis not present

## 2019-07-23 ENCOUNTER — Telehealth: Payer: Self-pay | Admitting: Cardiovascular Disease

## 2019-07-23 NOTE — Telephone Encounter (Signed)
Pt called asking to be put back on the Amlodipine 5mg ... she had a televisit with Richardson Dopp PA 04/2019 and was advised to increase to 10mg .. she did for a week or so but felt washed out and very tired her BP cuff at home she says tends to run high so she does not like to be treated based on those readings.. she would not give those to me.... but says at her PCP office recently it was 120/74 on the 5mg .. I asked of she could possibly keep track of her BP for just a few days and let us know but she says she cannot get a new cuff and just felt better on the 5mg  that she has been on for over a year (04/2018)..   I will forward to Richardson Dopp PA for review since Dr. Burt Knack is out of the office.Marland Kitchen and will call the pt back with his recommendation.

## 2019-07-23 NOTE — Telephone Encounter (Signed)
Ok to change Amlodipine to 5 mg once daily. If she can get a new BP cuff and check it from time to time, that would be helpful. Call if BP > 130/80, consistently. Richardson Dopp, PA-C    07/23/2019 5:23 PM

## 2019-07-23 NOTE — Telephone Encounter (Signed)
See Scott's note from May, he increased Amlodipine to 10MG  from 5MG , patient was to take 2 5MG  tablets.

## 2019-07-23 NOTE — Telephone Encounter (Signed)
New Message  Patient is now taking 5 mg vs 10 mg and wants the prescription to be updated so that the pills she receives are 5 mg pill. Please assist  *STAT* If patient is at the pharmacy, call can be transferred to refill team.   1. Which medications need to be refilled? (please list name of each medication and dose if known) amLODipine (NORVASC) 10 MG tablet  Patient is only taking 5 mg, wants 5 mg pills.  2. Which pharmacy/location (including street and city if local pharmacy) is medication to be sent to? [place], [place].  3. Do they need a 30 day or 90 day supply? 90 day supply

## 2019-07-24 MED ORDER — AMLODIPINE BESYLATE 5 MG PO TABS: 5.0000 mg | ORAL_TABLET | Freq: Every day | ORAL | 3 refills | Status: DC

## 2019-07-24 NOTE — Telephone Encounter (Signed)
I spoke to the patient with Scott's recommendation.  She verbalized understanding.

## 2019-09-19 DIAGNOSIS — H9190 Unspecified hearing loss, unspecified ear: Secondary | ICD-10-CM | POA: Diagnosis not present

## 2019-09-19 DIAGNOSIS — H6090 Unspecified otitis externa, unspecified ear: Secondary | ICD-10-CM | POA: Diagnosis not present

## 2019-09-19 DIAGNOSIS — Z6826 Body mass index (BMI) 26.0-26.9, adult: Secondary | ICD-10-CM | POA: Diagnosis not present

## 2019-10-07 DIAGNOSIS — M5136 Other intervertebral disc degeneration, lumbar region: Secondary | ICD-10-CM | POA: Diagnosis not present

## 2019-10-07 DIAGNOSIS — I10 Essential (primary) hypertension: Secondary | ICD-10-CM | POA: Diagnosis not present

## 2019-10-07 DIAGNOSIS — Z6826 Body mass index (BMI) 26.0-26.9, adult: Secondary | ICD-10-CM | POA: Diagnosis not present

## 2019-10-07 DIAGNOSIS — M47816 Spondylosis without myelopathy or radiculopathy, lumbar region: Secondary | ICD-10-CM | POA: Diagnosis not present

## 2020-01-01 DIAGNOSIS — H401132 Primary open-angle glaucoma, bilateral, moderate stage: Secondary | ICD-10-CM | POA: Diagnosis not present

## 2020-01-05 DIAGNOSIS — M47816 Spondylosis without myelopathy or radiculopathy, lumbar region: Secondary | ICD-10-CM | POA: Diagnosis not present

## 2020-01-05 DIAGNOSIS — M5416 Radiculopathy, lumbar region: Secondary | ICD-10-CM | POA: Diagnosis not present

## 2020-01-05 DIAGNOSIS — M5136 Other intervertebral disc degeneration, lumbar region: Secondary | ICD-10-CM | POA: Diagnosis not present

## 2020-01-21 DIAGNOSIS — F418 Other specified anxiety disorders: Secondary | ICD-10-CM | POA: Diagnosis not present

## 2020-01-21 DIAGNOSIS — Z20822 Contact with and (suspected) exposure to covid-19: Secondary | ICD-10-CM | POA: Diagnosis not present

## 2020-01-21 DIAGNOSIS — G43009 Migraine without aura, not intractable, without status migrainosus: Secondary | ICD-10-CM | POA: Diagnosis not present

## 2020-01-21 DIAGNOSIS — D509 Iron deficiency anemia, unspecified: Secondary | ICD-10-CM | POA: Diagnosis not present

## 2020-01-21 DIAGNOSIS — Z952 Presence of prosthetic heart valve: Secondary | ICD-10-CM | POA: Diagnosis not present

## 2020-01-21 DIAGNOSIS — Z6827 Body mass index (BMI) 27.0-27.9, adult: Secondary | ICD-10-CM | POA: Diagnosis not present

## 2020-01-21 DIAGNOSIS — I10 Essential (primary) hypertension: Secondary | ICD-10-CM | POA: Diagnosis not present

## 2020-01-21 DIAGNOSIS — Z9181 History of falling: Secondary | ICD-10-CM | POA: Diagnosis not present

## 2020-01-21 DIAGNOSIS — E7800 Pure hypercholesterolemia, unspecified: Secondary | ICD-10-CM | POA: Diagnosis not present

## 2020-02-02 DIAGNOSIS — E785 Hyperlipidemia, unspecified: Secondary | ICD-10-CM | POA: Diagnosis not present

## 2020-02-02 DIAGNOSIS — Z9181 History of falling: Secondary | ICD-10-CM | POA: Diagnosis not present

## 2020-02-02 DIAGNOSIS — Z1331 Encounter for screening for depression: Secondary | ICD-10-CM | POA: Diagnosis not present

## 2020-02-02 DIAGNOSIS — Z0000 Encounter for general adult medical examination without abnormal findings: Secondary | ICD-10-CM | POA: Diagnosis not present

## 2020-03-29 DIAGNOSIS — M5416 Radiculopathy, lumbar region: Secondary | ICD-10-CM | POA: Diagnosis not present

## 2020-03-29 DIAGNOSIS — M5136 Other intervertebral disc degeneration, lumbar region: Secondary | ICD-10-CM | POA: Diagnosis not present

## 2020-03-29 DIAGNOSIS — M47816 Spondylosis without myelopathy or radiculopathy, lumbar region: Secondary | ICD-10-CM | POA: Diagnosis not present

## 2020-03-29 DIAGNOSIS — M4120 Other idiopathic scoliosis, site unspecified: Secondary | ICD-10-CM | POA: Diagnosis not present

## 2020-04-05 DIAGNOSIS — H401132 Primary open-angle glaucoma, bilateral, moderate stage: Secondary | ICD-10-CM | POA: Diagnosis not present

## 2020-04-05 DIAGNOSIS — H2513 Age-related nuclear cataract, bilateral: Secondary | ICD-10-CM | POA: Diagnosis not present

## 2020-04-09 ENCOUNTER — Other Ambulatory Visit: Payer: Self-pay | Admitting: Physician Assistant

## 2020-05-05 ENCOUNTER — Ambulatory Visit: Payer: PPO | Admitting: Physician Assistant

## 2020-05-05 ENCOUNTER — Encounter: Payer: Self-pay | Admitting: Physician Assistant

## 2020-05-05 ENCOUNTER — Other Ambulatory Visit: Payer: Self-pay

## 2020-05-05 VITALS — BP 110/68 | HR 70 | Ht 62.0 in | Wt 139.8 lb

## 2020-05-05 DIAGNOSIS — Z952 Presence of prosthetic heart valve: Secondary | ICD-10-CM | POA: Diagnosis not present

## 2020-05-05 DIAGNOSIS — I359 Nonrheumatic aortic valve disorder, unspecified: Secondary | ICD-10-CM | POA: Diagnosis not present

## 2020-05-05 NOTE — Progress Notes (Signed)
Cardiology Office Note:    Date:  05/05/2020   ID:  Stephanie Velasquez, DOB 05-30-1956, MRN [id number]  PCP:  Cyndi Bender, PA-C  Cardiologist:  Sherren Mocha, MD   Electrophysiologist:  None   Referring MD: Cyndi Bender, PA-C   Chief Complaint:  Follow-up (History of aortic valve replacement)    Patient Profile:    Stephanie Velasquez is a 64 y.o. female with:   Bicuspid aortic valve with associated aortic stenosis   S/p bioprosthetic AVR in 10/16  Echocardiogram 5/19: EF 92-70, AVR ok w mean 16 mmHg, PASP 39  Breast CA s/p lumpectomy   Spinal stenosis  PUD  Hypertension   Hyperlipidemia  Iron deficiency anemia  COPD  Tobacco abuse  Prior CV studies:  Echocardiogram 05/07/2018 EF 65-70, normal wall motion, bioprosthetic aortic valve with normal function (mean 16, peak 30), mild MAC, PASP 39  Echo 11/16 Mild LVH, vigorous LVF, EF 65-70%, normal wall motion, normal diastolic function, bioprosthetic AVR okay with trivial AI, trivial pericardial effusion  History of Present Illness:    Stephanie Velasquez was last seen by me in 04/2019 via Telemedicine.  She returns for routine follow up.  She is here alone.  She had COVID-19 in Jan 2021 with mild symptoms.  She has not had chest pain, syncope, orthopnea.  She had dyspnea on exertion that is chronic and stable.  She has mild leg swelling that resolves with elevation.      Past Medical History:  Diagnosis Date  . Anxiety and depression   . Aortic valve disease    a. s/p AVR 11/16;  b. Echo 11/16: mild LVH, vigorous LVF, no RWMA, bioprosthetic AVR ok withmean 13 mmHg and trivial AI, trivial effusion  . Breast cancer [facility])    Dr Bobby Rumpf Dr Rogelia Boga, right side 2011  . Carotid stenosis    a. Carotid US 10/16: bilateral ICA 1-39%  . Diverticulosis   . GERD (gastroesophageal reflux disease)    Nexium  . Hearing difficulty of left ear   . History of cardiac catheterization    a. LHC 9/16: normal coronary arteries with  very large RCA  . History of migraine headaches   . Hypercholesterolemia    Simvastatin  . Hypertension   . Iron deficiency anemia   . Osteoarthritis    hands  . Overactive bladder   . Paralysis of right upper extremity ([place])   . PUD (peptic ulcer disease)   . Raynaud's syndrome   . Spinal stenosis     Current Medications: Current Meds  Medication Sig  . amLODipine (NORVASC) 10 MG tablet Take 10 mg by mouth daily.  Marland Kitchen amLODipine (NORVASC) 5 MG tablet Take 1 tablet (5 mg total) by mouth daily.  Marland Kitchen aspirin 81 MG EC tablet Take 1 tablet (81 mg total) by mouth daily.  . Aspirin-Acetaminophen-Caffeine (GOODY HEADACHE PO) Take 1 each by mouth 2 (two) times daily as needed (PAIN).  Marland Kitchen buPROPion (WELLBUTRIN XL) 300 MG 24 hr tablet Take 300 mg by mouth daily.   Marland Kitchen esomeprazole (NEXIUM) 40 MG capsule Take 40 mg by mouth daily at 12 noon.  . ferrous sulfate 325 (65 FE) MG tablet Take 1 tablet by mouth every other day.   . latanoprost (XALATAN) 0.005 % ophthalmic solution Place 1 drop into both eyes at bedtime.   Marland Kitchen morphine (MS CONTIN) 15 MG 12 hr tablet Take 15 mg by mouth every 12 (twelve) hours.   Marland Kitchen oxyCODONE-acetaminophen (PERCOCET) 7.5-325 MG per tablet Take 1 tablet  by mouth every 8 (eight) hours as needed for severe pain.  . promethazine (PHENERGAN) 25 MG tablet Take 25 mg by mouth every 6 (six) hours as needed for nausea or vomiting.  . rosuvastatin (CRESTOR) 20 MG tablet Take 1 tablet (20 mg total) by mouth daily. Please keep upcoming appt in May before anymore refills. Thank you  . SUMAtriptan (IMITREX) 100 MG tablet Take 100 mg by mouth every 2 (two) hours as needed for migraine. May repeat in 2 hours if headache persists or recurs.     Allergies:   Naproxen   Social History   Tobacco Use  . Smoking status: Former Smoker    Packs/day: 0.50    Types: Cigarettes    Quit date: 09/30/2015    Years since quitting: 4.6  . Smokeless tobacco: Never Used  Substance Use Topics  .  Alcohol use: No    Alcohol/week: 0.0 standard drinks  . Drug use: No     Family Hx: The patient's family history includes COPD in her brother and father; Heart disease in her brother, father, and sister; Hyperlipidemia in her brother, father, and sister; Hypertension in her brother, father, mother, and sister.  ROS   EKGs/Labs/Other Test Reviewed:    EKG:  EKG is   ordered today.  The ekg ordered today demonstrates normal sinus rhythm, HR 70, LAD, non-specific ST-TW changes, QTc 388, no changes   Recent Labs: No results found for requested labs within last 8760 hours.   Recent Lipid Panel Lab Results  Component Value Date/Time   CHOL 189 04/29/2018 11:06 AM   TRIG 77 04/29/2018 11:06 AM   HDL 86 04/29/2018 11:06 AM   CHOLHDL 2.2 04/29/2018 11:06 AM   LDLCALC 88 04/29/2018 11:06 AM    Physical Exam:    VS:  BP 110/68   Pulse 70   Ht _0  (1.575 m)   Wt 139 lb 12.8 oz (63.4 kg)   SpO2 92%   BMI 25.57 kg/m     Wt Readings from Last 3 Encounters:  05/05/20 139 lb 12.8 oz (63.4 kg)  04/30/19 133 lb (60.3 kg)  04/29/18 126 lb 9.6 oz (57.4 kg)     Constitutional:      Appearance: Healthy appearance. Not in distress.  Neck:     Thyroid: No thyromegaly.     Vascular: JVD normal.  Pulmonary:     Effort: Pulmonary effort is normal.     Breath sounds: No wheezing. No rales.  Cardiovascular:     Normal rate. Regular rhythm. Normal S1. Normal S2.     Murmurs: There is a grade 2/6 harsh systolic murmur at the URSB.  Edema:    Peripheral edema absent.  Abdominal:     Palpations: Abdomen is soft. There is no hepatomegaly.  Skin:    General: Skin is warm and dry.  Neurological:     Mental Status: Alert and oriented to person, place and time.     Cranial Nerves: Cranial nerves are intact.       ASSESSMENT & PLAN:    1. Aortic valve disease 2. S/P AVR (aortic valve replacement) Status post bioprosthetic AVR in 2016.  Echocardiogram in 2019 demonstrated normally  functioning aortic valve prosthesis and normal LV function.  Blood pressures well controlled.  She is doing well without symptoms to suggest any recurrent aortic stenosis.  Consider repeat echocardiogram in 1 to 2 years.  Continue SBE prophylaxis.  Follow-up with Dr. Burt Knack or me in 1 year.  Dispo:  Return in about 1 year (around 05/05/2021) for Routine Follow Up, w/ Dr. Burt Knack, or Richardson Dopp, PA-C, in person.   Medication Adjustments/Labs and Tests Ordered: Current medicines are reviewed at length with the patient today.  Concerns regarding medicines are outlined above.  Tests Ordered: Orders Placed This Encounter  Procedures  . EKG 12-Lead   Medication Changes: No orders of the defined types were placed in this encounter.   Signed, Richardson Dopp, PA-C  05/05/2020 5:54 PM    [facility] [place], [place], [place]  [address] Phone: [phone]; Fax: [phone]

## 2020-05-05 NOTE — Patient Instructions (Signed)
Medication Instructions:  Your physician recommends that you continue on your current medications as directed. Please refer to the Current Medication list given to you today.  *If you need a refill on your cardiac medications before your next appointment, please call your pharmacy*  Lab Work: None ordered today  Testing/Procedures: None ordered today  Follow-Up: At [facility], you and your health needs are our priority.  As part of our continuing mission to provide you with exceptional heart care, we have created designated Provider Care Teams.  These Care Teams include your primary Cardiologist (physician) and Advanced Practice Providers (APPs -  Physician Assistants and Nurse Practitioners) who all work together to provide you with the care you need, when you need it.  We recommend signing up for the patient portal called "MyChart".  Sign up information is provided on this After Visit Summary.  MyChart is used to connect with patients for Virtual Visits (Telemedicine).  Patients are able to view lab/test results, encounter notes, upcoming appointments, etc.  Non-urgent messages can be sent to your provider as well.   To learn more about what you can do with MyChart, go to [email or web address].    Your next appointment:   12 month(s)  The format for your next appointment:   In Person  Provider:   You may see Michael Cooper, MD or Scott Weaver, PA-C 

## 2020-06-16 DIAGNOSIS — M47816 Spondylosis without myelopathy or radiculopathy, lumbar region: Secondary | ICD-10-CM | POA: Diagnosis not present

## 2020-06-16 DIAGNOSIS — M62838 Other muscle spasm: Secondary | ICD-10-CM | POA: Diagnosis not present

## 2020-06-16 DIAGNOSIS — M4120 Other idiopathic scoliosis, site unspecified: Secondary | ICD-10-CM | POA: Diagnosis not present

## 2020-06-16 DIAGNOSIS — M5136 Other intervertebral disc degeneration, lumbar region: Secondary | ICD-10-CM | POA: Diagnosis not present

## 2020-07-01 DIAGNOSIS — Z1231 Encounter for screening mammogram for malignant neoplasm of breast: Secondary | ICD-10-CM | POA: Diagnosis not present

## 2020-07-05 DIAGNOSIS — C773 Secondary and unspecified malignant neoplasm of axilla and upper limb lymph nodes: Secondary | ICD-10-CM | POA: Diagnosis not present

## 2020-07-05 DIAGNOSIS — Z170 Estrogen receptor positive status [ER+]: Secondary | ICD-10-CM | POA: Diagnosis not present

## 2020-07-05 DIAGNOSIS — C50111 Malignant neoplasm of central portion of right female breast: Secondary | ICD-10-CM | POA: Diagnosis not present

## 2020-07-13 ENCOUNTER — Other Ambulatory Visit: Payer: Self-pay | Admitting: Cardiovascular Disease

## 2020-07-13 ENCOUNTER — Other Ambulatory Visit: Payer: Self-pay | Admitting: Physician Assistant

## 2020-07-14 ENCOUNTER — Telehealth: Payer: Self-pay

## 2020-07-14 ENCOUNTER — Other Ambulatory Visit: Payer: Self-pay

## 2020-07-14 NOTE — Telephone Encounter (Signed)
Pt's pharmacy is requesting a refill on amlodipine 10 mg and amlodipine 5 mg tablets. Please clarify which dosage pt is supposed to be taking or is pt taking both? Please address

## 2020-07-14 NOTE — Telephone Encounter (Signed)
Called pt and left message asking pt to call the office. Need patient to verify which dose of AMLODIPINE she is currently taking.

## 2020-07-14 NOTE — Telephone Encounter (Signed)
Follow up   Patient called back to advise that she is taking 10mg  of the amlodopine.

## 2020-07-14 NOTE — Telephone Encounter (Signed)
Called and left message for pt to call the office, need to verify which dose of AMLODIPINE she is taking. 5 mg once a day and 10 mg once a day is listed.

## 2020-07-15 DIAGNOSIS — I10 Essential (primary) hypertension: Secondary | ICD-10-CM | POA: Diagnosis not present

## 2020-07-15 DIAGNOSIS — Z952 Presence of prosthetic heart valve: Secondary | ICD-10-CM | POA: Diagnosis not present

## 2020-07-15 DIAGNOSIS — Z6826 Body mass index (BMI) 26.0-26.9, adult: Secondary | ICD-10-CM | POA: Diagnosis not present

## 2020-07-15 DIAGNOSIS — D509 Iron deficiency anemia, unspecified: Secondary | ICD-10-CM | POA: Diagnosis not present

## 2020-07-15 DIAGNOSIS — F418 Other specified anxiety disorders: Secondary | ICD-10-CM | POA: Diagnosis not present

## 2020-07-15 DIAGNOSIS — G43009 Migraine without aura, not intractable, without status migrainosus: Secondary | ICD-10-CM | POA: Diagnosis not present

## 2020-07-15 DIAGNOSIS — E7800 Pure hypercholesterolemia, unspecified: Secondary | ICD-10-CM | POA: Diagnosis not present

## 2020-07-21 ENCOUNTER — Other Ambulatory Visit: Payer: Self-pay | Admitting: Physician Assistant

## 2020-07-22 NOTE — Telephone Encounter (Signed)
Pt's pharmacy is requesting a refill on amlodipine 10 mg tablet. I do not see where this medication was increased back to 10 mg. Pt has both amlodipine 5 mg and amlodipine 10 mg. Please clarify which strength pt is suppose to be taking, because pt states that she takes 10 mg. Can we please clarify one and D/C the other? Thanks

## 2020-08-02 DIAGNOSIS — H401132 Primary open-angle glaucoma, bilateral, moderate stage: Secondary | ICD-10-CM | POA: Diagnosis not present

## 2020-08-02 DIAGNOSIS — H2513 Age-related nuclear cataract, bilateral: Secondary | ICD-10-CM | POA: Diagnosis not present

## 2020-09-07 DIAGNOSIS — F1120 Opioid dependence, uncomplicated: Secondary | ICD-10-CM | POA: Diagnosis not present

## 2020-09-07 DIAGNOSIS — M47816 Spondylosis without myelopathy or radiculopathy, lumbar region: Secondary | ICD-10-CM | POA: Diagnosis not present

## 2020-09-07 DIAGNOSIS — M5136 Other intervertebral disc degeneration, lumbar region: Secondary | ICD-10-CM | POA: Diagnosis not present

## 2020-10-01 ENCOUNTER — Other Ambulatory Visit: Payer: Self-pay

## 2020-10-01 MED ORDER — ROSUVASTATIN CALCIUM 20 MG PO TABS: 20.0000 mg | ORAL_TABLET | Freq: Every day | ORAL | 2 refills | Status: DC

## 2020-10-01 MED ORDER — AMLODIPINE BESYLATE 10 MG PO TABS: 10.0000 mg | ORAL_TABLET | Freq: Every day | ORAL | 2 refills | Status: DC

## 2020-10-01 NOTE — Telephone Encounter (Signed)
Pt's medications amlodipine and rosuvastatin were called into pt's pharmacy [facility] mail order pharmacy. Pharmacist verbalized understanding.

## 2020-11-02 ENCOUNTER — Other Ambulatory Visit: Payer: Self-pay | Admitting: Cardiovascular Disease

## 2021-02-16 ENCOUNTER — Other Ambulatory Visit: Payer: Self-pay | Admitting: Neurosurgery

## 2021-02-16 DIAGNOSIS — M5136 Other intervertebral disc degeneration, lumbar region: Secondary | ICD-10-CM

## 2021-05-04 ENCOUNTER — Ambulatory Visit
Admission: RE | Admit: 2021-05-04 | Discharge: 2021-05-04 | Disposition: A | Discharge status: Home / Self Care | Payer: Medicare Other | Source: Ambulatory Visit | Attending: Neurosurgery | Admitting: Neurosurgery

## 2021-05-04 DIAGNOSIS — M5136 Other intervertebral disc degeneration, lumbar region: Secondary | ICD-10-CM

## 2021-06-27 ENCOUNTER — Other Ambulatory Visit: Payer: Self-pay

## 2021-06-29 ENCOUNTER — Other Ambulatory Visit: Payer: Self-pay | Admitting: Physician Assistant

## 2021-07-06 ENCOUNTER — Encounter: Payer: Self-pay | Admitting: Physician Assistant

## 2021-07-06 ENCOUNTER — Ambulatory Visit: Payer: Medicare Other | Admitting: Physician Assistant

## 2021-07-06 ENCOUNTER — Other Ambulatory Visit: Payer: Self-pay

## 2021-07-06 VITALS — BP 104/60 | HR 70 | Ht 62.0 in | Wt 133.8 lb

## 2021-07-06 DIAGNOSIS — E7800 Pure hypercholesterolemia, unspecified: Secondary | ICD-10-CM | POA: Diagnosis not present

## 2021-07-06 DIAGNOSIS — Z952 Presence of prosthetic heart valve: Secondary | ICD-10-CM | POA: Diagnosis not present

## 2021-07-06 DIAGNOSIS — I10 Essential (primary) hypertension: Secondary | ICD-10-CM

## 2021-07-06 DIAGNOSIS — I359 Nonrheumatic aortic valve disorder, unspecified: Secondary | ICD-10-CM | POA: Diagnosis not present

## 2021-07-06 DIAGNOSIS — S8992XA Unspecified injury of left lower leg, initial encounter: Secondary | ICD-10-CM

## 2021-07-06 NOTE — Progress Notes (Addendum)
Cardiology Office Note:    Date:  07/06/2021   ID:  Lala Lund, DOB [birth year], MRN [id number]  PCP:  Cyndi Bender, PA-C   [facility] Providers Cardiologist:  Sherren Mocha, MD Cardiology APP:  [facility]   Referring MD: Cyndi Bender, PA-C   Chief Complaint:  Follow-up (History of AVR)    Patient Profile:    Stephanie Velasquez is a 65 y.o. female with:  Bicuspid aortic valve with associated aortic stenosis  S/p bioprosthetic AVR in 10/16 Echocardiogram 5/19: EF 75-70, AVR ok w mean 16 mmHg, PASP 39 Breast CA s/p lumpectomy Spinal stenosis PUD Hypertension Hyperlipidemia Iron deficiency anemia COPD Tobacco abuse   Prior CV studies:   Echocardiogram 05/07/2018 EF 65-70, normal wall motion, bioprosthetic aortic valve with normal function (mean 16, peak 30), mild MAC, PASP 39  Echo 11/16 Mild LVH, vigorous LVF, EF 65-70%, normal wall motion, normal diastolic function, bioprosthetic AVR okay with trivial AI, trivial pericardial effusion  RIGHT/LEFT HEART CATH  09/03/2015 Narrative RA = 4 RV = 36/1/6 PA = 32/10 (20) PCW = 12 Fick cardiac output/index = 3.4/2.4 PVR = 2.4 WU FA sat = 90% PA sat = 53%, 55% Ao Pressure: 139/76 (100)  Assessment: 1. Normal coronaries with very large RCA 2. Normal pressures 3. Mildly depressed cardiac output in the setting of severe AS   History of Present Illness: Stephanie Velasquez was last seen in 5/21.  She returns for f/u.  She is here alone.  She has been doing well.  She has been busy planting in her garden.  Unfortunately, all of her tomatoes are rotten.  She fell in her bathroom recently and injured her left leg.  She has not seen anyone for this.  Her left lower extremity is painful and bruised.  The pain is getting better and she does seem to be able to bear weight on her left leg.  She has not had chest pain, shortness of breath, syncope.        Past Medical History:  Diagnosis Date   Anxiety and  depression    Aortic valve disease    a. s/p AVR 11/16;  b. Echo 11/16: mild LVH, vigorous LVF, no RWMA, bioprosthetic AVR ok withmean 13 mmHg and trivial AI, trivial effusion   Breast cancer ([place])    Dr Bobby Rumpf Dr Rogelia Boga, right side 2011   Carotid stenosis    a. Carotid US 10/16: bilateral ICA 1-39%   Diverticulosis    GERD (gastroesophageal reflux disease)    Nexium   Hearing difficulty of left ear    History of cardiac catheterization    a. LHC 9/16: normal coronary arteries with very large RCA   History of migraine headaches    Hypercholesterolemia    Simvastatin   Hypertension    Iron deficiency anemia    Osteoarthritis    hands   Overactive bladder    Paralysis of right upper extremity ([facility])    PUD (peptic ulcer disease)    Raynaud's syndrome    Spinal stenosis     Current Medications: Current Meds  Medication Sig   amLODipine (NORVASC) 10 MG tablet Take 1 tablet (10 mg total) by mouth daily. Please keep upcoming appt in July 2022 with Cardiologist before anymore refills. Thank you   aspirin 81 MG EC tablet Take 1 tablet (81 mg total) by mouth daily.   Aspirin-Acetaminophen-Caffeine (GOODY HEADACHE PO) Take 1 each by mouth 2 (two) times daily as needed (PAIN).   buPROPion (  WELLBUTRIN XL) 300 MG 24 hr tablet Take 300 mg by mouth daily.    cyclobenzaprine (FLEXERIL) 10 MG tablet Take 10 mg by mouth 3 (three) times daily as needed for muscle spasms.   escitalopram (LEXAPRO) 20 MG tablet Take 20 mg by mouth daily.   esomeprazole (NEXIUM) 40 MG capsule Take 40 mg by mouth daily at 12 noon.   ferrous sulfate 325 (65 FE) MG tablet Take 1 tablet by mouth every other day.    latanoprost (XALATAN) 0.005 % ophthalmic solution Place 1 drop into both eyes at bedtime.    morphine (MS CONTIN) 15 MG 12 hr tablet Take 15 mg by mouth every 12 (twelve) hours.    oxyCODONE-acetaminophen (PERCOCET) 7.5-325 MG per tablet Take 1 tablet by mouth every 8 (eight) hours as needed for severe pain.    promethazine (PHENERGAN) 25 MG tablet Take 25 mg by mouth every 6 (six) hours as needed for nausea or vomiting.   rosuvastatin (CRESTOR) 20 MG tablet TAKE 1 TABLET BY MOUTH ONCE DAILY   SUMAtriptan (IMITREX) 100 MG tablet Take 100 mg by mouth every 2 (two) hours as needed for migraine. May repeat in 2 hours if headache persists or recurs.     Allergies:   Naproxen and Pregabalin   Social History   Tobacco Use   Smoking status: Former    Packs/day: 0.50    Types: Cigarettes    Quit date: 09/30/2015    Years since quitting: 5.7   Smokeless tobacco: Never  Substance Use Topics   Alcohol use: No    Alcohol/week: 0.0 standard drinks   Drug use: No     Family Hx: The patient's family history includes COPD in her brother and father; Heart disease in her brother, father, and sister; Hyperlipidemia in her brother, father, and sister; Hypertension in her brother, father, mother, and sister.  Review of Systems  Musculoskeletal:        Left leg pain and bruising    EKGs/Labs/Other Test Reviewed:    EKG:  EKG is ordered today.  The ekg ordered today demonstrates normal sinus rhythm, heart rate 70, normal axis, nonspecific ST-T wave changes, QTC 395, No change from prior tracing  Recent Labs: No results found for requested labs within last 8760 hours.   Recent Lipid Panel Lab Results  Component Value Date/Time   CHOL 189 04/29/2018 11:06 AM   TRIG 77 04/29/2018 11:06 AM   HDL 86 04/29/2018 11:06 AM   LDLCALC 88 04/29/2018 11:06 AM    Labs obtained through [facility] - personally reviewed and interpreted: 01/17/2021: Total cholesterol 199, HDL 91, LDL 94, triglycerides 78, Hgb 13.7, creatinine 0.62, K+ 4.2, ALT 22   Risk Assessment/Calculations:      Physical Exam:    VS:  BP 104/60   Pulse 70   Ht _0  (1.575 m)   Wt 133 lb 12.8 oz (60.7 kg)   SpO2 94%   BMI 24.47 kg/m     Wt Readings from Last 3 Encounters:  07/06/21 133 lb 12.8 oz (60.7 kg)  05/05/20 139 lb 12.8  oz (63.4 kg)  04/30/19 133 lb (60.3 kg)     Constitutional:      Appearance: Healthy appearance. Not in distress.  Neck:     Vascular: No JVR.  Pulmonary:     Effort: Pulmonary effort is normal.     Breath sounds: No wheezing. No rales.  Cardiovascular:     Normal rate. Regular rhythm. Normal S1. Normal  S2.      Murmurs: There is a grade 2/6 harsh systolic murmur at the URSB.  Edema:    Peripheral edema absent.  Abdominal:     Palpations: Abdomen is soft.  Musculoskeletal:     Comments: Left lower extremity with large area of bruising over the mid tibial area; this is tender to touch and there is some swelling associated. Skin:    General: Skin is warm and dry.  Neurological:     Mental Status: Alert and oriented to person, place and time.     Cranial Nerves: Cranial nerves are intact.       ASSESSMENT & PLAN:    1. Aortic valve disease 2. S/P AVR (aortic valve replacement) History of bioprosthetic AVR in 2016.  Overall, she is doing well.  She understands the need to continue SBE prophylaxis.  It has been several years since her last echocardiogram.  I will arrange for a follow-up echocardiogram.  She can follow-up with me or Dr. Burt Knack in 1 year.  3. Essential hypertension The patient's blood pressure is controlled on her current regimen.  Continue current dose of amlodipine.  She sometimes has some dizziness.  However, this does not seem to be significant and her blood pressures are never lower than what we obtained in the office today.  4. Pure hypercholesterolemia LDL optimal on most recent lab work.  Continue current dose of rosuvastatin.    5. Injury of left lower extremity, initial encounter She has swelling and bruising where she fell.  She does not seem to have any symptoms consistent with compartment syndrome.  Given the mechanism of her fall and her orthopedic issues, I recommended sending her for a lower extremity x-ray.  If this is abnormal, I will send her to  orthopedics.   Dispo:  Return in about 1 year (around 07/06/2022) for Routine Follow Up, w/ Dr. Burt Knack, or Richardson Dopp, PA-C.   Medication Adjustments/Labs and Tests Ordered: Current medicines are reviewed at length with the patient today.  Concerns regarding medicines are outlined above.  Tests Ordered: Orders Placed This Encounter  Procedures   DG Tibia/Fibula Left   EKG 12-Lead   ECHOCARDIOGRAM COMPLETE   Medication Changes: No orders of the defined types were placed in this encounter.   Signed, Richardson Dopp, PA-C  07/06/2021 3:58 PM    [facility] [place], [place], [place]  [address] Phone: [phone]; Fax: [phone]

## 2021-07-06 NOTE — Patient Instructions (Signed)
Medication Instructions:   Your physician recommends that you continue on your current medications as directed. Please refer to the Current Medication list given to you today.  *If you need a refill on your cardiac medications before your next appointment, please call your pharmacy*   Lab Work:  -None  If you have labs (blood work) drawn today and your tests are completely normal, you will receive your results only by: [place] (if you have MyChart) OR A paper copy in the mail If you have any lab test that is abnormal or we need to change your treatment, we will call you to review the results.   Testing/Procedures: Your physician has requested that you have an echocardiogram. Echocardiography is a painless test that uses sound waves to create images of your heart. It provides your doctor with information about the size and shape of your heart and how well your heart's chambers and valves are working. This procedure takes approximately one hour. There are no restrictions for this procedure.    Follow-Up: At [facility], you and your health needs are our priority.  As part of our continuing mission to provide you with exceptional heart care, we have created designated Provider Care Teams.  These Care Teams include your primary Cardiologist (physician) and Advanced Practice Providers (APPs -  Physician Assistants and Nurse Practitioners) who all work together to provide you with the care you need, when you need it.  We recommend signing up for the patient portal called "MyChart".  Sign up information is provided on this After Visit Summary.  MyChart is used to connect with patients for Virtual Visits (Telemedicine).  Patients are able to view lab/test results, encounter notes, upcoming appointments, etc.  Non-urgent messages can be sent to your provider as well.   To learn more about what you can do with MyChart, go to [email or web address].    Your next appointment:   1  year(s)  The format for your next appointment:   In Person  Provider:   You may see Sherren Mocha, MD or one of the following Advanced Practice Providers on your designated Care Team:   Richardson Dopp, PA-C   Other Instructions Your physician wants you to follow-up in: 1 year with Dr. Burt Knack or Richardson Dopp, PA-C.  You will receive a reminder letter in the mail two months in advance. If you don't receive a letter, please call our office to schedule the follow-up appointment.  Go to [facility] for leg xray.

## 2021-07-19 ENCOUNTER — Encounter: Payer: Self-pay | Admitting: Physician Assistant

## 2021-09-27 ENCOUNTER — Other Ambulatory Visit: Payer: Self-pay | Admitting: Cardiovascular Disease

## 2021-11-02 ENCOUNTER — Other Ambulatory Visit (HOSPITAL_COMMUNITY): Payer: Medicare Other

## 2021-11-16 ENCOUNTER — Other Ambulatory Visit: Payer: Self-pay

## 2021-11-16 ENCOUNTER — Encounter: Payer: Self-pay | Admitting: Physician Assistant

## 2021-11-16 ENCOUNTER — Ambulatory Visit (HOSPITAL_COMMUNITY): Payer: Medicare Other | Attending: Cardiology

## 2021-11-16 DIAGNOSIS — Z952 Presence of prosthetic heart valve: Secondary | ICD-10-CM | POA: Diagnosis not present

## 2021-11-16 DIAGNOSIS — I359 Nonrheumatic aortic valve disorder, unspecified: Secondary | ICD-10-CM | POA: Diagnosis present

## 2021-11-16 DIAGNOSIS — I77810 Thoracic aortic ectasia: Secondary | ICD-10-CM | POA: Insufficient documentation

## 2021-11-16 LAB — ECHOCARDIOGRAM COMPLETE
AV Mean grad: 17.2 mmHg
AV Peak grad: 29.5 mmHg
Ao pk vel: 2.72 m/s
Area-P 1/2: 3.65 cm2
S' Lateral: 2.9 cm

## 2021-11-17 ENCOUNTER — Telehealth: Payer: Self-pay | Admitting: *Deleted

## 2021-11-17 DIAGNOSIS — I77819 Aortic ectasia, unspecified site: Secondary | ICD-10-CM

## 2021-11-17 NOTE — Telephone Encounter (Signed)
Nuala Alpha, LPN  88/06/5796  2:82 PM EST Back to Top    Pt made aware of echo results and plan per Richardson Dopp PA-C, via [place] [facility] message through this result.  Pt did view this in mychart:  Seen by patient Stephanie Velasquez on 11/17/2021  9:09 AM.   Will place repeat echo for one year in the system and send a message to our PCC/Echo Scheduler, to call the pt back and arrange this appt for that time frame.

## 2021-11-17 NOTE — Telephone Encounter (Signed)
-----   Message from Liliane Shi, [place] sent at 11/16/2021  5:44 PM EST ----- Echocardiogram demonstrates normal EF and normally functioning AVR.  She does have slight enlargement of the ascending aorta. PLAN:  -Continue current medications/treatment plan and follow up as scheduled.  -Arrange repeat echo in 1 year (diagnosis: Dilated ascending aorta) -Send copy to PCP Richardson Dopp, PA-C    11/16/2021 5:40 PM

## 2021-12-29 ENCOUNTER — Other Ambulatory Visit: Payer: Self-pay | Admitting: Cardiovascular Disease

## 2022-09-03 DIAGNOSIS — I10 Essential (primary) hypertension: Secondary | ICD-10-CM | POA: Insufficient documentation

## 2022-09-03 DIAGNOSIS — I77819 Aortic ectasia, unspecified site: Secondary | ICD-10-CM | POA: Insufficient documentation

## 2022-09-03 DIAGNOSIS — E7800 Pure hypercholesterolemia, unspecified: Secondary | ICD-10-CM | POA: Insufficient documentation

## 2022-09-03 NOTE — Progress Notes (Unsigned)
Cardiology Office Note:    Date:  09/05/2022   ID:  Stephanie Velasquez, DOB September 24, 1956, MRN [id number]  PCP:  Cyndi Bender, [facility] Cardiologist:  Sherren Mocha, MD Cardiology APP:  [facility]    Referring MD: Cyndi Bender, PA-C   Chief Complaint:  Follow-up for history of AVR    Patient Profile: Bicuspid aortic valve with associated aortic stenosis  S/p bioprosthetic AVR in 10/16 Echocardiogram 5/19: EF 80-70, AVR ok w mean 16 mmHg, PASP 39 Ascending aorta dilation Echo 11/22: 41 mm Breast CA s/p lumpectomy Spinal stenosis PUD Hypertension Hyperlipidemia Iron deficiency anemia COPD Tobacco abuse  Prior CV Studies: ECHO COMPLETE WO IMAGING ENHANCING AGENT 11/16/2021 EF 65-70, no RWMA, mild LVH, normal RVSF, normal PASP (RVSP 32.4), trivial MR, normally functioning AVR with mean gradient 18, dilated ascending aorta (41 mm)    Echocardiogram 05/07/2018 EF 65-70, normal wall motion, bioprosthetic aortic valve with normal function (mean 16, peak 30), mild MAC, PASP 39   Echo 11/16 Mild LVH, vigorous LVF, EF 65-70%, normal wall motion, normal diastolic function, bioprosthetic AVR okay with trivial AI, trivial pericardial effusion   RIGHT/LEFT HEART CATH  09/03/2015 Assessment: 1. Normal coronaries with very large RCA 2. Normal pressures 3. Mildly depressed cardiac output in the setting of severe AS  History of Present Illness:   Stephanie Velasquez is a 66 y.o. female with the above problem list.  She was last seen in July 2022.  Follow-up echo demonstrated normally functioning AVR.  Her aorta was mildly dilated at 41 mm.  Follow-up echocardiogram is pending in November of this year.  She returns for follow-up.  She is here alone.  She has noted increasing shortness of breath over the past year.  She mainly notices it when she bends over.  She has not had any significant weight changes.  She is significant limited by her back and  spinal stenosis.  She has not had chest pain, orthopnea.  She has had some dependent leg edema that resolves with elevation.  She has not had syncope.       Past Medical History:  Diagnosis Date   Anxiety and depression    Aortic valve disease    a. s/p AVR 11/16;  b. Echo 11/16: mild LVH, vigorous LVF, no RWMA, bioprosthetic AVR ok withmean 13 mmHg and trivial AI, trivial effusion   Ascending aorta dilation ([place])    Echo 11/22: EF 65-70, no RWMA, mild LVH, normal RVSF, RVSP 32.4, trivial MR, AVR with trivial AI, mean gradient 18 mmHg (stable), ascending aorta 41 mm   Breast cancer ([place])    Dr Bobby Rumpf Dr Rogelia Boga, right side 2011   Carotid stenosis    a. Carotid US 10/16: bilateral ICA 1-39%   Diverticulosis    GERD (gastroesophageal reflux disease)    Nexium   Hearing difficulty of left ear    History of cardiac catheterization    a. LHC 9/16: normal coronary arteries with very large RCA   History of migraine headaches    Hypercholesterolemia    Simvastatin   Hypertension    Iron deficiency anemia    Osteoarthritis    hands   Overactive bladder    Paralysis of right upper extremity (HCC)    PUD (peptic ulcer disease)    Raynaud's syndrome    Spinal stenosis    Current Medications: Current Meds  Medication Sig   amLODipine (NORVASC) 10 MG tablet Take 1 tablet (10 mg total) by  mouth daily.   aspirin 81 MG EC tablet Take 1 tablet (81 mg total) by mouth daily.   Aspirin-Acetaminophen-Caffeine (GOODY HEADACHE PO) Take 1 each by mouth 2 (two) times daily as needed (PAIN).   Brimonidine Tartrate 0.025 % SOLN Place 2 Drops/kg into both eyes 2 (two) times daily.   buPROPion (WELLBUTRIN XL) 300 MG 24 hr tablet Take 300 mg by mouth daily.    cyclobenzaprine (FLEXERIL) 10 MG tablet Take 10 mg by mouth 3 (three) times daily as needed for muscle spasms.   escitalopram (LEXAPRO) 20 MG tablet Take 20 mg by mouth daily.   esomeprazole (NEXIUM) 40 MG capsule Take 40 mg by mouth daily at 12  noon.   ferrous sulfate 325 (65 FE) MG tablet Take 1 tablet by mouth every other day.    latanoprost (XALATAN) 0.005 % ophthalmic solution Place 1 drop into both eyes at bedtime.    morphine (MS CONTIN) 15 MG 12 hr tablet Take 15 mg by mouth every 12 (twelve) hours.    oxyCODONE-acetaminophen (PERCOCET) 7.5-325 MG per tablet Take 1 tablet by mouth every 8 (eight) hours as needed for severe pain.   promethazine (PHENERGAN) 25 MG tablet Take 25 mg by mouth every 6 (six) hours as needed for nausea or vomiting.   rosuvastatin (CRESTOR) 20 MG tablet TAKE 1 TABLET BY MOUTH ONCE DAILY   SUMAtriptan (IMITREX) 100 MG tablet Take 100 mg by mouth every 2 (two) hours as needed for migraine. May repeat in 2 hours if headache persists or recurs.    Allergies:   Naproxen and Pregabalin   Social History   Tobacco Use   Smoking status: Former    Packs/day: 0.50    Types: Cigarettes    Quit date: 09/30/2015    Years since quitting: 6.9   Smokeless tobacco: Never  Substance Use Topics   Alcohol use: No    Alcohol/week: 0.0 standard drinks of alcohol   Drug use: No    Family Hx: The patient's family history includes COPD in her brother and father; Heart disease in her brother, father, and sister; Hyperlipidemia in her brother, father, and sister; Hypertension in her brother, father, mother, and sister.  Review of Systems  Respiratory:  Negative for cough and wheezing.   Gastrointestinal:  Negative for hematochezia.  Genitourinary:  Negative for hematuria.     EKGs/Labs/Other Test Reviewed:    EKG:  EKG is  ordered today.  The ekg ordered today demonstrates NSR, HR 60, T wave inversions in V2 through V4, QTc 412, no significant change when compared to prior tracings  Recent Labs: 09/05/2022: BUN 17; Creatinine, Ser 0.70; Hemoglobin WILL FOLLOW; NT-Pro BNP WILL FOLLOW; Platelets WILL FOLLOW; Potassium 4.5; Sodium 140   Recent Lipid Panel No results for input(s): "CHOL", "TRIG", "HDL", "VLDL",  "LDLCALC", "LDLDIRECT" in the last 8760 hours.   Risk Assessment/Calculations/Metrics:              Physical Exam:    VS:  BP 100/60   Pulse 60   Ht _0  (1.549 m)   Wt 136 lb 6.4 oz (61.9 kg)   SpO2 93%   BMI 25.77 kg/m     Wt Readings from Last 3 Encounters:  09/05/22 136 lb 6.4 oz (61.9 kg)  07/06/21 133 lb 12.8 oz (60.7 kg)  05/05/20 139 lb 12.8 oz (63.4 kg)    Constitutional:      Appearance: Healthy appearance. Not in distress.  Neck:     Vascular: JVD  normal.  Pulmonary:     Effort: Pulmonary effort is normal.     Breath sounds: No wheezing. No rales.  Cardiovascular:     Normal rate. Regular rhythm. Normal S1. Normal S2.      Murmurs: There is a grade 2/6 systolic murmur at the URSB.  Edema:    Peripheral edema present.    Pretibial: bilateral trace edema of the pretibial area. Abdominal:     Palpations: Abdomen is soft.  Skin:    General: Skin is warm and dry.  Neurological:     Mental Status: Alert and oriented to person, place and time.         ASSESSMENT & PLAN:   Shortness of breath She has worsening shortness of breath over the past year.  She does describe increasing shortness of breath with bending over which raises a concern for volume excess.  Her neck veins are flat on exam and her lungs are clear.  She has not had diastolic heart failure in the past.  Her EF was normal on most recent echo in November 2022.  I reviewed her chart and she did have significant obstructive defect on PFTs prior to her surgery.  She does have a prior history of smoking which is significant.  She has noted low O2 sats on room air with ambulation (88%) that would increase to low to mid 90s with rest.  I suspect her shortness of breath is mostly related to COPD.  However, I would like to obtain a BMET, BNP, CBC today.  If her BNP is significantly elevated, I will place her on diuretic therapy.  I will also update her echo now instead of waiting until November.  I will also  send her for chest x-ray.  If her cardiac work-up is completely normal, she will need referral to pulmonology.  S/P AVR (aortic valve replacement) Normally functioning most recent echocardiogram.  Obtain follow-up echocardiogram as noted.  Continue SBE prophylaxis.  Ascending aorta dilation (HCC) Obtain follow-up echocardiogram as noted.  Essential hypertension Blood pressures controlled.  Continue Norvasc 10 mg daily.  Pure hypercholesterolemia Labs obtained through [facility] personally reviewed and interpreted.  08/04/2022: Total cholesterol 167, HDL 75, LDL 77, triglycerides 79, ALT 15.  Lipids controlled.  Continue Crestor 20 mg daily.          Dispo:  Return in about 6 months (around 03/06/2023) for Routine Follow Up, w/ Richardson Dopp, PA-C.   Medication Adjustments/Labs and Tests Ordered: Current medicines are reviewed at length with the patient today.  Concerns regarding medicines are outlined above.  Tests Ordered: Orders Placed This Encounter  Procedures   DG Chest 2 View   Basic metabolic panel   Pro b natriuretic peptide (BNP)   CBC   EKG 12-Lead   ECHOCARDIOGRAM COMPLETE   Medication Changes: No orders of the defined types were placed in this encounter.  Signed, Richardson Dopp, PA-C  09/05/2022 6:18 PM    [address], [place], [place]  [id number] Phone: [phone]; Fax: [phone]

## 2022-09-05 ENCOUNTER — Ambulatory Visit: Payer: Medicare Other | Attending: Physician Assistant | Admitting: Physician Assistant

## 2022-09-05 ENCOUNTER — Ambulatory Visit
Admission: RE | Admit: 2022-09-05 | Discharge: 2022-09-05 | Disposition: A | Discharge status: Home / Self Care | Payer: Medicare Other | Source: Ambulatory Visit | Attending: Physician Assistant | Admitting: Physician Assistant

## 2022-09-05 ENCOUNTER — Encounter: Payer: Self-pay | Admitting: Physician Assistant

## 2022-09-05 VITALS — BP 100/60 | HR 60 | Ht 61.0 in | Wt 136.4 lb

## 2022-09-05 DIAGNOSIS — I10 Essential (primary) hypertension: Secondary | ICD-10-CM | POA: Diagnosis not present

## 2022-09-05 DIAGNOSIS — E7800 Pure hypercholesterolemia, unspecified: Secondary | ICD-10-CM

## 2022-09-05 DIAGNOSIS — R0602 Shortness of breath: Secondary | ICD-10-CM

## 2022-09-05 DIAGNOSIS — I359 Nonrheumatic aortic valve disorder, unspecified: Secondary | ICD-10-CM | POA: Diagnosis not present

## 2022-09-05 DIAGNOSIS — Z952 Presence of prosthetic heart valve: Secondary | ICD-10-CM

## 2022-09-05 DIAGNOSIS — I77810 Thoracic aortic ectasia: Secondary | ICD-10-CM

## 2022-09-05 DIAGNOSIS — I77819 Aortic ectasia, unspecified site: Secondary | ICD-10-CM

## 2022-09-05 NOTE — Assessment & Plan Note (Signed)
Labs obtained through [facility] personally reviewed and interpreted.  08/04/2022: Total cholesterol 167, HDL 75, LDL 77, triglycerides 79, ALT 15.  Lipids controlled.  Continue Crestor 20 mg daily.

## 2022-09-05 NOTE — Assessment & Plan Note (Signed)
Normally functioning most recent echocardiogram.  Obtain follow-up echocardiogram as noted.  Continue SBE prophylaxis.

## 2022-09-05 NOTE — Assessment & Plan Note (Signed)
She has worsening shortness of breath over the past year.  She does describe increasing shortness of breath with bending over which raises a concern for volume excess.  Her neck veins are flat on exam and her lungs are clear.  She has not had diastolic heart failure in the past.  Her EF was normal on most recent echo in November 2022.  I reviewed her chart and she did have significant obstructive defect on PFTs prior to her surgery.  She does have a prior history of smoking which is significant.  She has noted low O2 sats on room air with ambulation (88%) that would increase to low to mid 90s with rest.  I suspect her shortness of breath is mostly related to COPD.  However, I would like to obtain a BMET, BNP, CBC today.  If her BNP is significantly elevated, I will place her on diuretic therapy.  I will also update her echo now instead of waiting until November.  I will also send her for chest x-ray.  If her cardiac work-up is completely normal, she will need referral to pulmonology.

## 2022-09-05 NOTE — Assessment & Plan Note (Signed)
Blood pressures controlled.  Continue Norvasc 10 mg daily.

## 2022-09-05 NOTE — Assessment & Plan Note (Signed)
Obtain follow-up echocardiogram as noted. 

## 2022-09-05 NOTE — Patient Instructions (Signed)
Medication Instructions:  Your physician recommends that you continue on your current medications as directed. Please refer to the Current Medication list given to you today.  *If you need a refill on your cardiac medications before your next appointment, please call your pharmacy*   Lab Work: TODAY:  BMET, CBC, & PRO BNP  If you have labs (blood work) drawn today and your tests are completely normal, you will receive your results only by: [facility] (if you have MyChart) OR A paper copy in the mail If you have any lab test that is abnormal or we need to change your treatment, we will call you to review the results.   Testing/Procedures: Your physician has requested that you have an echocardiogram. Echocardiography is a painless test that uses sound waves to create images of your heart. It provides your doctor with information about the size and shape of your heart and how well your heart's chambers and valves are working. This procedure takes approximately one hour. There are no restrictions for this procedure.  A chest x-ray takes a picture of the organs and structures inside the chest, including the heart, lungs, and blood vessels. This test can show several things, including, whether the heart is enlarges; whether fluid is building up in the lungs; and whether pacemaker / defibrillator leads are still in place. Go to: [employer] [facility], Today, [address] for the X-ray    Follow-Up: At [facility], you and your health needs are our priority.  As part of our continuing mission to provide you with exceptional heart care, we have created designated Provider Care Teams.  These Care Teams include your primary Cardiologist (physician) and Advanced Practice Providers (APPs -  Physician Assistants and Nurse Practitioners) who all work together to provide you with the care you need, when you need it.  We recommend signing up for the patient portal called  "MyChart".  Sign up information is provided on this After Visit Summary.  MyChart is used to connect with patients for Virtual Visits (Telemedicine).  Patients are able to view lab/test results, encounter notes, upcoming appointments, etc.  Non-urgent messages can be sent to your provider as well.   To learn more about what you can do with MyChart, go to [email or web address].    Your next appointment:   6 month(s)  The format for your next appointment:   In Person  Provider:   Richardson Dopp, PA-C         Other Instructions   Important Information About Sugar

## 2022-09-06 LAB — BASIC METABOLIC PANEL
BUN/Creatinine Ratio: 24 (ref 12–28)
BUN: 17 mg/dL (ref 8–27)
CO2: 27 mmol/L (ref 20–29)
Calcium: 9.4 mg/dL (ref 8.7–10.3)
Chloride: 100 mmol/L (ref 96–106)
Creatinine, Ser: 0.7 mg/dL (ref 0.57–1.00)
Glucose: 106 mg/dL — ABNORMAL HIGH (ref 70–99)
Potassium: 4.5 mmol/L (ref 3.5–5.2)
Sodium: 140 mmol/L (ref 134–144)
eGFR: 94 mL/min/{1.73_m2} (ref >59)

## 2022-09-06 LAB — CBC
Hematocrit: 41.3 % (ref 34.0–46.6)
Hemoglobin: 13.6 g/dL (ref 11.1–15.9)
MCH: 30.1 pg (ref 26.6–33.0)
MCHC: 32.9 g/dL (ref 31.5–35.7)
MCV: 91 fL (ref 79–97)
Platelets: 216 10*3/uL (ref 150–450)
RBC: 4.52 x10E6/uL (ref 3.77–5.28)
RDW: 12.6 % (ref 11.7–15.4)
WBC: 4.2 10*3/uL (ref 3.4–10.8)

## 2022-09-06 LAB — PRO B NATRIURETIC PEPTIDE: NT-Pro BNP: 257 pg/mL (ref 0–301)

## 2022-09-11 NOTE — Progress Notes (Signed)
Pt has been made aware of normal result and verbalized understanding.  jw

## 2022-09-20 ENCOUNTER — Other Ambulatory Visit (HOSPITAL_COMMUNITY): Payer: Medicare Other

## 2022-09-27 ENCOUNTER — Ambulatory Visit (HOSPITAL_COMMUNITY): Payer: Medicare Other | Attending: Physician Assistant

## 2022-09-27 DIAGNOSIS — E7800 Pure hypercholesterolemia, unspecified: Secondary | ICD-10-CM | POA: Diagnosis not present

## 2022-09-27 DIAGNOSIS — I10 Essential (primary) hypertension: Secondary | ICD-10-CM | POA: Diagnosis not present

## 2022-09-27 DIAGNOSIS — I359 Nonrheumatic aortic valve disorder, unspecified: Secondary | ICD-10-CM

## 2022-09-28 LAB — ECHOCARDIOGRAM COMPLETE
AR max vel: 1.53 cm2
AV Area VTI: 1.63 cm2
AV Area mean vel: 1.57 cm2
AV Mean grad: 15 mmHg
AV Peak grad: 29.2 mmHg
Ao pk vel: 2.7 m/s
Area-P 1/2: 3.48 cm2
S' Lateral: 2.1 cm

## 2022-09-29 ENCOUNTER — Telehealth: Payer: Self-pay

## 2022-09-29 DIAGNOSIS — R0602 Shortness of breath: Secondary | ICD-10-CM

## 2022-09-29 DIAGNOSIS — I272 Pulmonary hypertension, unspecified: Secondary | ICD-10-CM

## 2022-09-29 NOTE — Telephone Encounter (Signed)
The patient has been notified of the result and verbalized understanding.  All questions (if any) were answered. Michaelyn Barter, RN 09/29/2022 5:35 PM   Will place referral for pulmonary.

## 2022-09-29 NOTE — Telephone Encounter (Signed)
-----   Message from Liliane Shi, [place] sent at 09/29/2022 10:13 AM EDT ----- Echocardiogram shows normal ejection fraction and normally functioning aortic valve prosthesis. Pulmonary artery systolic pressure is mildly elevated.  Recent evaluation with BNP and chest x-ray normal.  I suspect her shortness of breath is related to COPD, especially since she has mild pulmonary hypertension.  She has noted low O2 sats at home.  I would like her to see pulmonology. PLAN:  -Refer to pulmonology for shortness of breath -Send copy to PCP -Continue current medications and f/u as planned. -Repeat echocardiogram 1 year (dilated aorta)  Richardson Dopp, PA-C    09/29/2022 10:00 AM

## 2022-10-07 ENCOUNTER — Other Ambulatory Visit: Payer: Self-pay | Admitting: Cardiovascular Disease

## 2022-10-09 ENCOUNTER — Encounter: Payer: Self-pay | Admitting: Cardiovascular Disease

## 2022-10-30 ENCOUNTER — Encounter: Payer: Self-pay | Admitting: Pulmonary Disease

## 2022-10-30 ENCOUNTER — Ambulatory Visit: Payer: Medicare Other | Admitting: Pulmonary Disease

## 2022-10-30 VITALS — BP 120/66 | HR 57 | Temp 98.6°F | Ht 62.0 in | Wt 134.6 lb

## 2022-10-30 DIAGNOSIS — R0609 Other forms of dyspnea: Secondary | ICD-10-CM

## 2022-10-30 DIAGNOSIS — J449 Chronic obstructive pulmonary disease, unspecified: Secondary | ICD-10-CM

## 2022-10-30 MED ORDER — STIOLTO RESPIMAT 2.5-2.5 MCG/ACT IN AERS: 2.0000 | INHALATION_SPRAY | Freq: Every day | RESPIRATORY_TRACT | 3 refills | Status: DC

## 2022-10-30 NOTE — Patient Instructions (Addendum)
Nice to see you again  In effort to try to treat the shortness of breath that seems intermittent in nature, you Stiolto 2 puffs once a day.  I sent a prescription for this.  Unfortunately, we did not have any samples today.  Please let me know if you experience any side effects and stop the Stiolto if so.  Please let me know if the medication is too expensive.  If the Stiolto is too difficult to use, let me know and I will send in an alternative.  Return to clinic in 3 months or sooner as needed with Dr. Silas Flood

## 2022-10-30 NOTE — Progress Notes (Signed)
$'@Patient'w$  ID: Stephanie Velasquez, female    DOB: 1956/01/05, 66 y.o.   MRN: [id number]  Chief Complaint  Patient presents with   Consult    Consult for pulm htn. Pt states she has been winded for about over ayear now and she said something to PCP. Cherst xray was done 9/21 and echocardiogram was done 10/11. Pt states she just got dx with pulm htn. No changes with weight. Pt states that some swelling noted in ankles but it went away.     Referring provider: Cyndi Bender, PA-C  HPI:   66 y.o. woman whom are seen in consultation for evaluation of dyspnea on exertion.  Most recent cardiology note x3 reviewed.  Patient has intermittent shortness of breath.  Really dyspnea exertion.  Nothing at rest.  She finds intermittently certain activities yield shortness of breath in the point she is to rest briefly that she can continue.  She gives examples of walking a dog.  Sometimes she walks outside in a few minutes she needs to stop, rest.  After that she can go about her normal 15 to 20-minute dog walk.  Most days, she can start and does not need to rest at all, does not need to stop and has no issues with her 15 to 20-minute walk.  Another example, recently went to a festival in [place].  Had to walk up steps 1 flight, to get out of the parking deck and up to the street.  Had to stop halfway up a flight of steps.  Rested briefly.  Then continue.  Then throughout the day he is fine walking around town, all around town.  Maybe some slight shortness of breath with inclines.  Otherwise no position that makes breathing better or worse.  No time of day to make things better or worse.  No seasonal or environmental factors she can identify that make things better or worse.  No real alleviating or exacerbating factors.  Most recent chest x-ray 08/2022 personally reviewed interpret as clear lungs bilaterally.  She had PFTs in 2016 that showed severe COPD, full interpretation below.  We discussed the intermittent  nature of the symptoms are a bit odd.  Most cardiac or pulmonary contributors to symptoms are constant.  However, asthma would fit with intermittent symptoms.  On questioning, she indicates history of exercise intolerance or inability keep up with children the younger age and throughout high school etc.  She denies significant seasonal allergies but does tend to keep a chronic rhinitis.  We discussed her recent echocardiogram 09/2022 that on my review reveals good seated aortic valve replacement with minimal trivial regurgitation, normal LA size, normal RA size, normal RV size, normal RV function with mildly increased RVSP.  We discussed at length pulmonary hypertension, the contributing factors needs individual that we need to identify, and diagnostic studies needed to further evaluate.  PMH: Hypertension, hyperlipidemia, seasonal allergies, chronic headaches, anxiety, depression Surgical history: Aortic valve replacement 09/2015, tubal ligation, breast surgery, appendectomy Family history: Father with emphysema, CAD, brother and sister with CAD Social history: Former smoker, 40+ pack year, quit in 2016   Questionaires / Pulmonary Flowsheets:   ACT:      No data to display          MMRC:     No data to display          Epworth:      No data to display          Tests:   FENO:  No results found for: "NITRICOXIDE"  PFT:    Latest Ref Rng & Units 09/24/2015    3:14 PM  PFT Results  FVC-Pre L 1.84   FVC-Predicted Pre % 62   FVC-Post L 1.92   FVC-Predicted Post % 64   Pre FEV1/FVC % % 61   Post FEV1/FCV % % 60   FEV1-Pre L 1.13   FEV1-Predicted Pre % 49   FEV1-Post L 1.16   DLCO uncorrected ml/min/mmHg 15.68   DLCO UNC% % 75   DLCO corrected ml/min/mmHg 16.88   DLCO COR %Predicted % 80   DLVA Predicted % 108   TLC L 4.86   TLC % Predicted % 103   RV % Predicted % 154   Personally reviewed and interpreted as spirometry indicative of severe fifth obstruction,  lung volumes indicate air trapping, DLCO mildly reduced  WALK:      No data to display          Imaging: Personally reviewed and as per EMR and discussion in this note No results found.  Lab Results: Personally reviewed CBC    Component Value Date/Time   WBC 4.2 09/05/2022 1137   WBC 6.7 10/05/2015 0222   RBC 4.52 09/05/2022 1137   RBC 3.25 (L) 10/05/2015 0222   HGB 13.6 09/05/2022 1137   HCT 41.3 09/05/2022 1137   PLT 216 09/05/2022 1137   MCV 91 09/05/2022 1137   MCH 30.1 09/05/2022 1137   MCH 28.9 10/05/2015 0222   MCHC 32.9 09/05/2022 1137   MCHC 32.0 10/05/2015 0222   RDW 12.6 09/05/2022 1137    BMET    Component Value Date/Time   NA 140 09/05/2022 1137   K 4.5 09/05/2022 1137   CL 100 09/05/2022 1137   CO2 27 09/05/2022 1137   GLUCOSE 106 (H) 09/05/2022 1137   GLUCOSE 99 10/04/2015 0241   BUN 17 09/05/2022 1137   CREATININE 0.70 09/05/2022 1137   CALCIUM 9.4 09/05/2022 1137   GFRNONAA >60 10/04/2015 0241   GFRAA >60 10/04/2015 0241    BNP No results found for: "BNP"  ProBNP    Component Value Date/Time   PROBNP 257 09/05/2022 1137    Specialty Problems       Pulmonary Problems   COPD (chronic obstructive pulmonary disease) (HCC)   Shortness of breath    Allergies  Allergen Reactions   Naproxen Other (See Comments)    Feels like elephant is sitting on her chest. OK with smaller doses   Pregabalin     Other reaction(s): Other (See Comments) Pt states, it made her feel crazy.    Immunization History  Administered Date(s) Administered   Influenza-Unspecified 10/12/2022   PNEUMOCOCCAL CONJUGATE-20 10/23/2022    Past Medical History:  Diagnosis Date   Anxiety and depression    Aortic valve disease    a. s/p AVR 11/16;  b. Echo 11/16: mild LVH, vigorous LVF, no RWMA, bioprosthetic AVR ok withmean 13 mmHg and trivial AI, trivial effusion   Ascending aorta dilation ([place])    Echo 11/22: EF 65-70, no RWMA, mild LVH, normal RVSF, RVSP  32.4, trivial MR, AVR with trivial AI, mean gradient 18 mmHg (stable), ascending aorta 41 mm   Breast cancer ([place])    Dr Bobby Rumpf Dr Rogelia Boga, right side 2011   Carotid stenosis    a. Carotid US 10/16: bilateral ICA 1-39%   Diverticulosis    GERD (gastroesophageal reflux disease)    Nexium   Hearing difficulty of left ear  History of cardiac catheterization    a. LHC 9/16: normal coronary arteries with very large RCA   History of migraine headaches    Hypercholesterolemia    Simvastatin   Hypertension    Iron deficiency anemia    Osteoarthritis    hands   Overactive bladder    Paralysis of right upper extremity (HCC)    PUD (peptic ulcer disease)    Raynaud's syndrome    Spinal stenosis     Tobacco History: Social History   Tobacco Use  Smoking Status Former   Packs/day: 0.50   Types: Cigarettes   Quit date: 09/30/2015   Years since quitting: 7.0  Smokeless Tobacco Never   Counseling given: Not Answered   Continue to not smoke  Outpatient Encounter Medications as of 10/30/2022  Medication Sig   Tiotropium Bromide-Olodaterol (STIOLTO RESPIMAT) 2.5-2.5 MCG/ACT AERS Inhale 2 puffs into the lungs daily.   amLODipine (NORVASC) 10 MG tablet Take 1 tablet (10 mg total) by mouth daily.   aspirin 81 MG EC tablet Take 1 tablet (81 mg total) by mouth daily.   Aspirin-Acetaminophen-Caffeine (GOODY HEADACHE PO) Take 1 each by mouth 2 (two) times daily as needed (PAIN).   Brimonidine Tartrate 0.025 % SOLN Place 2 Drops/kg into both eyes 2 (two) times daily.   buPROPion (WELLBUTRIN XL) 300 MG 24 hr tablet Take 300 mg by mouth daily.    cyclobenzaprine (FLEXERIL) 10 MG tablet Take 10 mg by mouth 3 (three) times daily as needed for muscle spasms.   escitalopram (LEXAPRO) 20 MG tablet Take 20 mg by mouth daily.   esomeprazole (NEXIUM) 40 MG capsule Take 40 mg by mouth daily at 12 noon.   ferrous sulfate 325 (65 FE) MG tablet Take 1 tablet by mouth every other day.    latanoprost  (XALATAN) 0.005 % ophthalmic solution Place 1 drop into both eyes at bedtime.    morphine (MS CONTIN) 15 MG 12 hr tablet Take 15 mg by mouth every 12 (twelve) hours.    oxyCODONE-acetaminophen (PERCOCET) 7.5-325 MG per tablet Take 1 tablet by mouth every 8 (eight) hours as needed for severe pain.   promethazine (PHENERGAN) 25 MG tablet Take 25 mg by mouth every 6 (six) hours as needed for nausea or vomiting.   rosuvastatin (CRESTOR) 20 MG tablet TAKE 1 TABLET BY MOUTH ONCE DAILY   SUMAtriptan (IMITREX) 100 MG tablet Take 100 mg by mouth every 2 (two) hours as needed for migraine. May repeat in 2 hours if headache persists or recurs.   No facility-administered encounter medications on file as of 10/30/2022.     Review of Systems  Review of Systems  No chest pain with exertion.  No orthopnea or PND.  Comprehensive review of systems otherwise negative. Physical Exam  BP 120/66 (BP Location: Left Arm, Patient Position: Sitting, Cuff Size: Normal)   Pulse (!) 57   Temp 98.6 F (37 C) (Oral)   Ht '5\' 2"'$  (1.575 m)   Wt 134 lb 9.6 oz (61.1 kg)   SpO2 93%   BMI 24.62 kg/m   Wt Readings from Last 5 Encounters:  10/30/22 134 lb 9.6 oz (61.1 kg)  09/05/22 136 lb 6.4 oz (61.9 kg)  07/06/21 133 lb 12.8 oz (60.7 kg)  05/05/20 139 lb 12.8 oz (63.4 kg)  04/30/19 133 lb (60.3 kg)    BMI Readings from Last 5 Encounters:  10/30/22 24.62 kg/m  09/05/22 25.77 kg/m  07/06/21 24.47 kg/m  05/05/20 25.57 kg/m  04/30/19 24.33  kg/m     Physical Exam General: Sitting in chair, no acute distress Eyes: EOMI, no icterus Neck: Supple, no JVP Pulmonary: Clear, distant, normal work of breathing Cardiovascular: Warm, no edema Abdomen: Nondistended, soft present MSK: No synovitis, no joint effusion Neuro: Normal gait, no weakness Psych: Normal mood, full affect   Assessment & Plan:   Dyspnea on exertion: Intermittent.  Not reliably reproducible.  History of exercise intolerance as a child.   High suspicion for underlying asthma.  Likely contributed to or exacerbated or made worse in the setting of history of significant tobacco abuse.  Recent chest x-ray is clear without significant hyperinflation on my review and interpretation.  Recent labs demonstrate improved anemia.  Possible pulmonary hypertension is contributing as well.  We will focus on asthma/cigarette related lung disease and reexplore pulmonary hypertension in the future if symptoms or not improving.  In particular, she is averse to right heart catheterization.  COPD/asthma overlap: History of exercise intolerance as a child.  Severe COPD and 09/2015 pulmonary function test.  Not using maintenance inhaler.  Start Stiolto 2 puffs daily.  Assess response.  Particular with dyspnea exertion.  Possible pulmonary hypertension: TTE 09/2022 with estimated elevated RVSP over 40.  Discussed at length further evaluation including repeat PFTs, CT high-resolution, polysomnography, overnight oximetry to further assess possible contributors.  After shared decision making we will focus on COPD/asthma for now and consider additional work-up for pulmonary hypertension in the future pending response to therapy.  Recent NT proBNP normal and normal RA size, RV size and RV function are relatively reassuring.  Return in about 3 months (around 01/30/2023).   Lanier Clam, MD 10/30/2022   This appointment required 62 minutes of patient care (this includes precharting, chart review, review of results, face-to-face care, etc.).

## 2022-11-17 ENCOUNTER — Other Ambulatory Visit (HOSPITAL_COMMUNITY): Payer: Medicare Other

## 2022-12-26 DIAGNOSIS — M5136 Other intervertebral disc degeneration, lumbar region: Secondary | ICD-10-CM | POA: Diagnosis not present

## 2022-12-26 DIAGNOSIS — M5416 Radiculopathy, lumbar region: Secondary | ICD-10-CM | POA: Diagnosis not present

## 2022-12-26 DIAGNOSIS — M4120 Other idiopathic scoliosis, site unspecified: Secondary | ICD-10-CM | POA: Diagnosis not present

## 2023-02-14 ENCOUNTER — Ambulatory Visit: Payer: Medicare Other | Admitting: Pulmonary Disease

## 2023-02-14 ENCOUNTER — Encounter: Payer: Self-pay | Admitting: Pulmonary Disease

## 2023-02-14 VITALS — BP 120/68 | HR 60 | Wt 134.0 lb

## 2023-02-14 DIAGNOSIS — J449 Chronic obstructive pulmonary disease, unspecified: Secondary | ICD-10-CM

## 2023-02-14 NOTE — Patient Instructions (Addendum)
Nice to see you again  I am glad you are better  Continue Stiolto 2 puffs once a day  Let me know if your breathing worsens or if Stiolto is no longer helps as much and we can come up with an alternative plan  Return to clinic in 6 months or sooner as needed with Dr. Silas Flood

## 2023-02-14 NOTE — Progress Notes (Signed)
$'@Patient'h$  ID: Stephanie Velasquez, female    DOB: 10/12/1956, 67 y.o.   MRN: [id number]  Chief Complaint  Patient presents with   Follow-up    Pt is here for follow up for DOE  and COPD. Pt states that she is doing great on her Stiolto.     Referring provider: Cyndi Bender, PA-C  HPI:   67 y.o. woman whom are seen in consultation for evaluation of dyspnea on exertion.   She returns for routine follow-up. Stiolto prescribed at last visit. This seems immensely helpful. Her breathing is much better. So good that she does forget doses every once in a while. She does this breathing when she is outdoors more, warmer months like the summer. Does feel that worse. She is looking forward to see if Stiolto continues to help during these months. No concerns or issues. Discussed role and rationale for rescue inhaler in the future if Stiolto is not improving things or escalation of maintenance inhaler from Stiolto to something stronger. She expressed understanding. For now, agreed to continue current medication given well-controlled symptoms.   HPI at initial visit: Patient has intermittent shortness of breath.  Really dyspnea exertion.  Nothing at rest.  She finds intermittently certain activities yield shortness of breath in the point she is to rest briefly that she can continue.  She gives examples of walking a dog.  Sometimes she walks outside in a few minutes she needs to stop, rest.  After that she can go about her normal 15 to 20-minute dog walk.  Most days, she can start and does not need to rest at all, does not need to stop and has no issues with her 15 to 20-minute walk.  Another example, recently went to a festival in [place].  Had to walk up steps 1 flight, to get out of the parking deck and up to the street.  Had to stop halfway up a flight of steps.  Rested briefly.  Then continue.  Then throughout the day he is fine walking around town, all around town.  Maybe some slight shortness of breath  with inclines.  Otherwise no position that makes breathing better or worse.  No time of day to make things better or worse.  No seasonal or environmental factors she can identify that make things better or worse.  No real alleviating or exacerbating factors.  Most recent chest x-ray 08/2022 personally reviewed interpret as clear lungs bilaterally.  She had PFTs in 2016 that showed severe COPD, full interpretation below.  We discussed the intermittent nature of the symptoms are a bit odd.  Most cardiac or pulmonary contributors to symptoms are constant.  However, asthma would fit with intermittent symptoms.  On questioning, she indicates history of exercise intolerance or inability keep up with children the younger age and throughout high school etc.  She denies significant seasonal allergies but does tend to keep a chronic rhinitis.  We discussed her recent echocardiogram 09/2022 that on my review reveals good seated aortic valve replacement with minimal trivial regurgitation, normal LA size, normal RA size, normal RV size, normal RV function with mildly increased RVSP.  We discussed at length pulmonary hypertension, the contributing factors needs individual that we need to identify, and diagnostic studies needed to further evaluate.  PMH: Hypertension, hyperlipidemia, seasonal allergies, chronic headaches, anxiety, depression Surgical history: Aortic valve replacement 09/2015, tubal ligation, breast surgery, appendectomy Family history: Father with emphysema, CAD, brother and sister with CAD Social history: Former smoker, 40+ pack year,  quit in 2016   Questionaires / Pulmonary Flowsheets:   ACT:      No data to display           MMRC:     No data to display           Epworth:      No data to display           Tests:   FENO:  No results found for: "NITRICOXIDE"  PFT:    Latest Ref Rng & Units 09/24/2015    3:14 PM  PFT Results  FVC-Pre L 1.84   FVC-Predicted Pre % 62    FVC-Post L 1.92   FVC-Predicted Post % 64   Pre FEV1/FVC % % 61   Post FEV1/FCV % % 60   FEV1-Pre L 1.13   FEV1-Predicted Pre % 49   FEV1-Post L 1.16   DLCO uncorrected ml/min/mmHg 15.68   DLCO UNC% % 75   DLCO corrected ml/min/mmHg 16.88   DLCO COR %Predicted % 80   DLVA Predicted % 108   TLC L 4.86   TLC % Predicted % 103   RV % Predicted % 154   Personally reviewed and interpreted as spirometry indicative of severe fifth obstruction, lung volumes indicate air trapping, DLCO mildly reduced  WALK:      No data to display           Imaging: Personally reviewed and as per EMR and discussion in this note No results found.  Lab Results: Personally reviewed CBC    Component Value Date/Time   WBC 4.2 09/05/2022 1137   WBC 6.7 10/05/2015 0222   RBC 4.52 09/05/2022 1137   RBC 3.25 (L) 10/05/2015 0222   HGB 13.6 09/05/2022 1137   HCT 41.3 09/05/2022 1137   PLT 216 09/05/2022 1137   MCV 91 09/05/2022 1137   MCH 30.1 09/05/2022 1137   MCH 28.9 10/05/2015 0222   MCHC 32.9 09/05/2022 1137   MCHC 32.0 10/05/2015 0222   RDW 12.6 09/05/2022 1137    BMET    Component Value Date/Time   NA 140 09/05/2022 1137   K 4.5 09/05/2022 1137   CL 100 09/05/2022 1137   CO2 27 09/05/2022 1137   GLUCOSE 106 (H) 09/05/2022 1137   GLUCOSE 99 10/04/2015 0241   BUN 17 09/05/2022 1137   CREATININE 0.70 09/05/2022 1137   CALCIUM 9.4 09/05/2022 1137   GFRNONAA >60 10/04/2015 0241   GFRAA >60 10/04/2015 0241    BNP No results found for: "BNP"  ProBNP    Component Value Date/Time   PROBNP 257 09/05/2022 1137    Specialty Problems       Pulmonary Problems   COPD (chronic obstructive pulmonary disease) (HCC)   Shortness of breath    Allergies  Allergen Reactions   Naproxen Other (See Comments)    Feels like elephant is sitting on her chest. OK with smaller doses   Pregabalin     Other reaction(s): Other (See Comments) Pt states, it made her feel crazy.     Immunization History  Administered Date(s) Administered   Influenza,inj,Quad PF,6+ Mos 10/15/2017   Influenza,inj,quad, With Preservative 09/25/2018   Influenza-Unspecified 10/12/2022   PNEUMOCOCCAL CONJUGATE-20 10/23/2022    Past Medical History:  Diagnosis Date   Anxiety and depression    Aortic valve disease    a. s/p AVR 11/16;  b. Echo 11/16: mild LVH, vigorous LVF, no RWMA, bioprosthetic AVR ok withmean 13 mmHg and trivial AI, trivial  effusion   Ascending aorta dilation ([place])    Echo 11/22: EF 65-70, no RWMA, mild LVH, normal RVSF, RVSP 32.4, trivial MR, AVR with trivial AI, mean gradient 18 mmHg (stable), ascending aorta 41 mm   Breast cancer ([place])    Dr Bobby Rumpf Dr Rogelia Boga, right side 2011   Carotid stenosis    a. Carotid US 10/16: bilateral ICA 1-39%   Diverticulosis    GERD (gastroesophageal reflux disease)    Nexium   Hearing difficulty of left ear    History of cardiac catheterization    a. LHC 9/16: normal coronary arteries with very large RCA   History of migraine headaches    Hypercholesterolemia    Simvastatin   Hypertension    Iron deficiency anemia    Osteoarthritis    hands   Overactive bladder    Paralysis of right upper extremity (HCC)    PUD (peptic ulcer disease)    Raynaud's syndrome    Spinal stenosis     Tobacco History: Social History   Tobacco Use  Smoking Status Former   Packs/day: 0.50   Types: Cigarettes   Quit date: 09/30/2015   Years since quitting: 7.3  Smokeless Tobacco Never   Counseling given: Not Answered   Continue to not smoke  Outpatient Encounter Medications as of 02/14/2023  Medication Sig   amLODipine (NORVASC) 10 MG tablet Take 1 tablet (10 mg total) by mouth daily.   aspirin 81 MG EC tablet Take 1 tablet (81 mg total) by mouth daily.   Aspirin-Acetaminophen-Caffeine (GOODY HEADACHE PO) Take 1 each by mouth 2 (two) times daily as needed (PAIN).   Brimonidine Tartrate 0.025 % SOLN Place 2 Drops/kg into both  eyes 2 (two) times daily.   buPROPion (WELLBUTRIN XL) 300 MG 24 hr tablet Take 300 mg by mouth daily.    cyclobenzaprine (FLEXERIL) 10 MG tablet Take 10 mg by mouth 3 (three) times daily as needed for muscle spasms.   escitalopram (LEXAPRO) 20 MG tablet Take 20 mg by mouth daily.   esomeprazole (NEXIUM) 40 MG capsule Take 40 mg by mouth daily at 12 noon.   ferrous sulfate 325 (65 FE) MG tablet Take 1 tablet by mouth every other day.    latanoprost (XALATAN) 0.005 % ophthalmic solution Place 1 drop into both eyes at bedtime.    morphine (MS CONTIN) 15 MG 12 hr tablet Take 15 mg by mouth every 12 (twelve) hours.    oxyCODONE-acetaminophen (PERCOCET) 7.5-325 MG per tablet Take 1 tablet by mouth every 8 (eight) hours as needed for severe pain.   promethazine (PHENERGAN) 25 MG tablet Take 25 mg by mouth every 6 (six) hours as needed for nausea or vomiting.   rosuvastatin (CRESTOR) 20 MG tablet TAKE 1 TABLET BY MOUTH ONCE DAILY   SUMAtriptan (IMITREX) 100 MG tablet Take 100 mg by mouth every 2 (two) hours as needed for migraine. May repeat in 2 hours if headache persists or recurs.   Tiotropium Bromide-Olodaterol (STIOLTO RESPIMAT) 2.5-2.5 MCG/ACT AERS Inhale 2 puffs into the lungs daily.   No facility-administered encounter medications on file as of 02/14/2023.     Review of Systems  Review of Systems  N/a Physical Exam  BP 120/68 (BP Location: Left Arm, Patient Position: Sitting, Cuff Size: Normal)   Pulse 60   Wt 134 lb (60.8 kg)   SpO2 96%   BMI 24.51 kg/m   Wt Readings from Last 5 Encounters:  02/14/23 134 lb (60.8 kg)  10/30/22 134 lb  9.6 oz (61.1 kg)  09/05/22 136 lb 6.4 oz (61.9 kg)  07/06/21 133 lb 12.8 oz (60.7 kg)  05/05/20 139 lb 12.8 oz (63.4 kg)    BMI Readings from Last 5 Encounters:  02/14/23 24.51 kg/m  10/30/22 24.62 kg/m  09/05/22 25.77 kg/m  07/06/21 24.47 kg/m  05/05/20 25.57 kg/m     Physical Exam General: Sitting in chair, no acute distress Eyes:  EOMI, no icterus Neck: Supple, no JVP Pulmonary: Clear, distant, normal work of breathing Cardiovascular: Warm, no edema Abdomen: Nondistended, bowel sounds present MSK: No synovitis, no joint effusion Neuro: Normal gait, no weakness Psych: Normal mood, full affect   Assessment & Plan:   Dyspnea on exertion: Intermittent.  Not reliably reproducible.  History of exercise intolerance as a child.  High suspicion for underlying asthma.  Likely contributed to or exacerbated or made worse in the setting of history of significant tobacco abuse.  Recent chest x-ray is clear without significant hyperinflation on my review and interpretation.  Recent labs demonstrate improved anemia.  Possible pulmonary hypertension is contributing as well.  Improvement with bronchodilators, Stiolto argues against significant pulmonary HTN.  COPD/asthma overlap: History of exercise intolerance as a child.  Severe COPD and 09/2015 pulmonary function test.  Not using maintenance inhaler.  Continue Stiolto 2 puffs daily given improvement in DOE.  Possible pulmonary hypertension: TTE 09/2022 with estimated elevated RVSP over 40.  Discussed at length further evaluation including repeat PFTs, CT high-resolution, polysomnography, overnight oximetry to further assess possible contributors.  After shared decision making we will focus on COPD/asthma for now. Given improvement with bronchodilators, defer additional work-up for pulmonary hypertension at this time.  Notably, her history of valvular dysfunction presents significant challenges with potential treatment.  Recent NT proBNP normal and normal RA size, RV size and RV function are relatively reassuring.  Return in about 6 months (around 08/15/2023).   Lanier Clam, MD 02/14/2023

## 2023-02-14 NOTE — Progress Notes (Deleted)
$'@Patient'v$  ID: Stephanie Velasquez, female    DOB: 16-Dec-1956, 67 y.o.   MRN: [id number]  Chief Complaint  Patient presents with   Follow-up    Pt is here for follow up for DOE  and COPD. Pt states that she is doing great on her Stiolto.     Referring provider: Cyndi Bender, PA-C  HPI:   67 y.o. woman whom are seen in consultation for evaluation of dyspnea on exertion.    She returns for routine follow-up.  Stiolto prescribed at last visit.  This seems immensely helpful.  Her breathing is much better.  So good that she does forget doses every once in a while.  She does this breathing when she is outdoors more, warmer months like the summer.  Does feel that worse.  She is looking forward to see if Stiolto continues to help during these months.  No concerns or issues.  Discussed role and rationale for rescue inhaler in the future if Stiolto is not improving things or escalation of maintenance inhaler from Stiolto to something stronger.  She expressed understanding.  For now, agreed to continue current medication given well-controlled symptoms.  HPI at initial visit: Patient has intermittent shortness of breath.  Really dyspnea exertion.  Nothing at rest.  She finds intermittently certain activities yield shortness of breath in the point she is to rest briefly that she can continue.  She gives examples of walking a dog.  Sometimes she walks outside in a few minutes she needs to stop, rest.  After that she can go about her normal 15 to 20-minute dog walk.  Most days, she can start and does not need to rest at all, does not need to stop and has no issues with her 15 to 20-minute walk.  Another example, recently went to a festival in [place].  Had to walk up steps 1 flight, to get out of the parking deck and up to the street.  Had to stop halfway up a flight of steps.  Rested briefly.  Then continue.  Then throughout the day he is fine walking around town, all around town.  Maybe some slight shortness  of breath with inclines.  Otherwise no position that makes breathing better or worse.  No time of day to make things better or worse.  No seasonal or environmental factors she can identify that make things better or worse.  No real alleviating or exacerbating factors.  Most recent chest x-ray 08/2022 personally reviewed interpret as clear lungs bilaterally.  She had PFTs in 2016 that showed severe COPD, full interpretation below.  We discussed the intermittent nature of the symptoms are a bit odd.  Most cardiac or pulmonary contributors to symptoms are constant.  However, asthma would fit with intermittent symptoms.  On questioning, she indicates history of exercise intolerance or inability keep up with children the younger age and throughout high school etc.  She denies significant seasonal allergies but does tend to keep a chronic rhinitis.  We discussed her recent echocardiogram 09/2022 that on my review reveals good seated aortic valve replacement with minimal trivial regurgitation, normal LA size, normal RA size, normal RV size, normal RV function with mildly increased RVSP.  We discussed at length pulmonary hypertension, the contributing factors needs individual that we need to identify, and diagnostic studies needed to further evaluate.  PMH: Hypertension, hyperlipidemia, seasonal allergies, chronic headaches, anxiety, depression Surgical history: Aortic valve replacement 09/2015, tubal ligation, breast surgery, appendectomy Family history: Father with emphysema, CAD, brother  and sister with CAD Social history: Former smoker, 40+ pack year, quit in 2016   Questionaires / Pulmonary Flowsheets:   ACT:      No data to display           MMRC:     No data to display           Epworth:      No data to display           Tests:   FENO:  No results found for: "NITRICOXIDE"  PFT:    Latest Ref Rng & Units 09/24/2015    3:14 PM  PFT Results  FVC-Pre L 1.84    FVC-Predicted Pre % 62   FVC-Post L 1.92   FVC-Predicted Post % 64   Pre FEV1/FVC % % 61   Post FEV1/FCV % % 60   FEV1-Pre L 1.13   FEV1-Predicted Pre % 49   FEV1-Post L 1.16   DLCO uncorrected ml/min/mmHg 15.68   DLCO UNC% % 75   DLCO corrected ml/min/mmHg 16.88   DLCO COR %Predicted % 80   DLVA Predicted % 108   TLC L 4.86   TLC % Predicted % 103   RV % Predicted % 154   Personally reviewed and interpreted as spirometry indicative of severe fifth obstruction, lung volumes indicate air trapping, DLCO mildly reduced  WALK:      No data to display           Imaging: Personally reviewed and as per EMR and discussion in this note No results found.  Lab Results: Personally reviewed CBC    Component Value Date/Time   WBC 4.2 09/05/2022 1137   WBC 6.7 10/05/2015 0222   RBC 4.52 09/05/2022 1137   RBC 3.25 (L) 10/05/2015 0222   HGB 13.6 09/05/2022 1137   HCT 41.3 09/05/2022 1137   PLT 216 09/05/2022 1137   MCV 91 09/05/2022 1137   MCH 30.1 09/05/2022 1137   MCH 28.9 10/05/2015 0222   MCHC 32.9 09/05/2022 1137   MCHC 32.0 10/05/2015 0222   RDW 12.6 09/05/2022 1137    BMET    Component Value Date/Time   NA 140 09/05/2022 1137   K 4.5 09/05/2022 1137   CL 100 09/05/2022 1137   CO2 27 09/05/2022 1137   GLUCOSE 106 (H) 09/05/2022 1137   GLUCOSE 99 10/04/2015 0241   BUN 17 09/05/2022 1137   CREATININE 0.70 09/05/2022 1137   CALCIUM 9.4 09/05/2022 1137   GFRNONAA >60 10/04/2015 0241   GFRAA >60 10/04/2015 0241    BNP No results found for: "BNP"  ProBNP    Component Value Date/Time   PROBNP 257 09/05/2022 1137    Specialty Problems       Pulmonary Problems   COPD (chronic obstructive pulmonary disease) (HCC)   Shortness of breath    Allergies  Allergen Reactions   Naproxen Other (See Comments)    Feels like elephant is sitting on her chest. OK with smaller doses   Pregabalin     Other reaction(s): Other (See Comments) Pt states, it made her  feel crazy.    Immunization History  Administered Date(s) Administered   Influenza,inj,Quad PF,6+ Mos 10/15/2017   Influenza,inj,quad, With Preservative 09/25/2018   Influenza-Unspecified 10/12/2022   PNEUMOCOCCAL CONJUGATE-20 10/23/2022    Past Medical History:  Diagnosis Date   Anxiety and depression    Aortic valve disease    a. s/p AVR 11/16;  b. Echo 11/16: mild LVH, vigorous LVF, no  RWMA, bioprosthetic AVR ok withmean 13 mmHg and trivial AI, trivial effusion   Ascending aorta dilation ([place])    Echo 11/22: EF 65-70, no RWMA, mild LVH, normal RVSF, RVSP 32.4, trivial MR, AVR with trivial AI, mean gradient 18 mmHg (stable), ascending aorta 41 mm   Breast cancer ([place])    Dr Bobby Rumpf Dr Rogelia Boga, right side 2011   Carotid stenosis    a. Carotid US 10/16: bilateral ICA 1-39%   Diverticulosis    GERD (gastroesophageal reflux disease)    Nexium   Hearing difficulty of left ear    History of cardiac catheterization    a. LHC 9/16: normal coronary arteries with very large RCA   History of migraine headaches    Hypercholesterolemia    Simvastatin   Hypertension    Iron deficiency anemia    Osteoarthritis    hands   Overactive bladder    Paralysis of right upper extremity (HCC)    PUD (peptic ulcer disease)    Raynaud's syndrome    Spinal stenosis     Tobacco History: Social History   Tobacco Use  Smoking Status Former   Packs/day: 0.50   Types: Cigarettes   Quit date: 09/30/2015   Years since quitting: 7.3  Smokeless Tobacco Never   Counseling given: Not Answered   Continue to not smoke  Outpatient Encounter Medications as of 02/14/2023  Medication Sig   amLODipine (NORVASC) 10 MG tablet Take 1 tablet (10 mg total) by mouth daily.   aspirin 81 MG EC tablet Take 1 tablet (81 mg total) by mouth daily.   Aspirin-Acetaminophen-Caffeine (GOODY HEADACHE PO) Take 1 each by mouth 2 (two) times daily as needed (PAIN).   Brimonidine Tartrate 0.025 % SOLN Place 2 Drops/kg  into both eyes 2 (two) times daily.   buPROPion (WELLBUTRIN XL) 300 MG 24 hr tablet Take 300 mg by mouth daily.    cyclobenzaprine (FLEXERIL) 10 MG tablet Take 10 mg by mouth 3 (three) times daily as needed for muscle spasms.   escitalopram (LEXAPRO) 20 MG tablet Take 20 mg by mouth daily.   esomeprazole (NEXIUM) 40 MG capsule Take 40 mg by mouth daily at 12 noon.   ferrous sulfate 325 (65 FE) MG tablet Take 1 tablet by mouth every other day.    latanoprost (XALATAN) 0.005 % ophthalmic solution Place 1 drop into both eyes at bedtime.    morphine (MS CONTIN) 15 MG 12 hr tablet Take 15 mg by mouth every 12 (twelve) hours.    oxyCODONE-acetaminophen (PERCOCET) 7.5-325 MG per tablet Take 1 tablet by mouth every 8 (eight) hours as needed for severe pain.   promethazine (PHENERGAN) 25 MG tablet Take 25 mg by mouth every 6 (six) hours as needed for nausea or vomiting.   rosuvastatin (CRESTOR) 20 MG tablet TAKE 1 TABLET BY MOUTH ONCE DAILY   SUMAtriptan (IMITREX) 100 MG tablet Take 100 mg by mouth every 2 (two) hours as needed for migraine. May repeat in 2 hours if headache persists or recurs.   Tiotropium Bromide-Olodaterol (STIOLTO RESPIMAT) 2.5-2.5 MCG/ACT AERS Inhale 2 puffs into the lungs daily.   No facility-administered encounter medications on file as of 02/14/2023.     Review of Systems  Review of Systems  N/a Physical Exam  BP 120/68 (BP Location: Left Arm, Patient Position: Sitting, Cuff Size: Normal)   Pulse 60   Wt 134 lb (60.8 kg)   SpO2 96%   BMI 24.51 kg/m   Wt Readings from Last 5  Encounters:  02/14/23 134 lb (60.8 kg)  10/30/22 134 lb 9.6 oz (61.1 kg)  09/05/22 136 lb 6.4 oz (61.9 kg)  07/06/21 133 lb 12.8 oz (60.7 kg)  05/05/20 139 lb 12.8 oz (63.4 kg)    BMI Readings from Last 5 Encounters:  02/14/23 24.51 kg/m  10/30/22 24.62 kg/m  09/05/22 25.77 kg/m  07/06/21 24.47 kg/m  05/05/20 25.57 kg/m     Physical Exam General: Sitting in chair, no acute  distress Eyes: EOMI, no icterus Neck: Supple, no JVP Pulmonary: Clear, distant, normal work of breathing Cardiovascular: Warm, no edema Abdomen: Nondistended, bowel sounds present MSK: No synovitis, no joint effusion Neuro: Normal gait, no weakness Psych: Normal mood, full affect   Assessment & Plan:   Dyspnea on exertion: Intermittent.  Not reliably reproducible.  History of exercise intolerance as a child.  High suspicion for underlying asthma.  Likely contributed to or exacerbated or made worse in the setting of history of significant tobacco abuse.  Recent chest x-ray is clear without significant hyperinflation on my review and interpretation.  Recent labs demonstrate improved anemia.  Possible pulmonary hypertension is contributing as well.  We will focus on asthma/cigarette related lung disease and reexplore pulmonary hypertension in the future if symptoms or not improving.  In particular, she is averse to right heart catheterization.  COPD/asthma overlap: History of exercise intolerance as a child.  Severe COPD and 09/2015 pulmonary function test.  Not using maintenance inhaler.  Start Stiolto 2 puffs daily.  Assess response.  Particular with dyspnea exertion.  Possible pulmonary hypertension: TTE 09/2022 with estimated elevated RVSP over 40.  Discussed at length further evaluation including repeat PFTs, CT high-resolution, polysomnography, overnight oximetry to further assess possible contributors.  After shared decision making we will focus on COPD/asthma for now and consider additional work-up for pulmonary hypertension in the future pending response to therapy.  Recent NT proBNP normal and normal RA size, RV size and RV function are relatively reassuring.  Return in about 6 months (around 08/15/2023).   Lanier Clam, MD 02/14/2023   This appointment required 62 minutes of patient care (this includes precharting, chart review, review of results, face-to-face care, etc.).

## 2023-03-08 DIAGNOSIS — M5136 Other intervertebral disc degeneration, lumbar region: Secondary | ICD-10-CM | POA: Diagnosis not present

## 2023-03-08 DIAGNOSIS — M4120 Other idiopathic scoliosis, site unspecified: Secondary | ICD-10-CM | POA: Diagnosis not present

## 2023-03-08 DIAGNOSIS — M5416 Radiculopathy, lumbar region: Secondary | ICD-10-CM | POA: Diagnosis not present

## 2023-03-27 NOTE — Progress Notes (Unsigned)
Cardiology Office Note:    Date:  03/27/2023  ID:  Stephanie Velasquez, DOB [birth year], MRN [id number] [facility] Providers Cardiologist:  Tonny Bollman, MD Cardiology APP:  Beatrice Lecher, PA-C { Click to update primary MD,subspecialty MD or APP then REFRESH:1}     Patient Profile:   Bicuspid aortic valve with associated aortic stenosis  S/p bioprosthetic AVR in 10/16 Cath 9/16: no CAD  Echocardiogram 5/19: EF 65-70, AVR ok w mean 16 mmHg, PASP 39 TTE 11/16/21: EF 65-70, no RWMA, mild LVH, normal RVSF, normal PASP (RVSP 32.4), trivial MR, normally functioning AVR with mean gradient 18, dilated ascending aorta (41 mm)  TTE 09/27/22: EF 65-70, no RWMA, GLS -25.5, NL RVSF, mildly elevated PASP, RVSP 44.7, trivial MR, mild to mod TR, normally functioning AVR, mean 15, trivial AI, mild dilation of asc aorta (41 mm), RAP 8 Ascending aorta dilation Echo 11/22: 41 mm Breast CA s/p lumpectomy Spinal stenosis PUD Hypertension Hyperlipidemia Iron deficiency anemia COPD Tobacco abuse     History of Present Illness:   Stephanie Velasquez is a 67 y.o. female who returns for f/u on AV disease. She was last seen 09/05/22. She was more short of breath. She had mild pulmonary hypertension on echocardiogram. Her BNP was normal. I referred her to pulmonology. She saw Dr. Judeth Horn and is being treated for asthma/COPD. ***  ROS ***    Studies Reviewed:    EKG:  ***  ***  Risk Assessment/Calculations:   {Does this patient have ATRIAL FIBRILLATION?:[id number]} No BP recorded.  {Refresh Note OR Click here to enter BP  :1}***       Physical Exam:   VS:  There were no vitals taken for this visit.   Wt Readings from Last 3 Encounters:  02/14/23 134 lb (60.8 kg)  10/30/22 134 lb 9.6 oz (61.1 kg)  09/05/22 136 lb 6.4 oz (61.9 kg)    Physical Exam***    ASSESSMENT AND PLAN:   No problem-specific Assessment & Plan notes found for this encounter. ***{  Shortness of breath She has  worsening shortness of breath over the past year.  She does describe increasing shortness of breath with bending over which raises a concern for volume excess.  Her neck veins are flat on exam and her lungs are clear.  She has not had diastolic heart failure in the past.  Her EF was normal on most recent echo in November 2022.  I reviewed her chart and she did have significant obstructive defect on PFTs prior to her surgery.  She does have a prior history of smoking which is significant.  She has noted low O2 sats on room air with ambulation (88%) that would increase to low to mid 90s with rest.  I suspect her shortness of breath is mostly related to COPD.  However, I would like to obtain a BMET, BNP, CBC today.  If her BNP is significantly elevated, I will place her on diuretic therapy.  I will also update her echo now instead of waiting until November.  I will also send her for chest x-ray.  If her cardiac work-up is completely normal, she will need referral to pulmonology.   S/P AVR (aortic valve replacement) Normally functioning most recent echocardiogram.  Obtain follow-up echocardiogram as noted.  Continue SBE prophylaxis.   Ascending aorta dilation (HCC) Obtain follow-up echocardiogram as noted.   Essential hypertension Blood pressures controlled.  Continue Norvasc 10 mg daily.   Pure hypercholesterolemia Labs obtained through [facility] personally reviewed  and interpreted.  08/04/2022: Total cholesterol 167, HDL 75, LDL 77, triglycerides 79, ALT 15.  Lipids controlled.  Continue Crestor 20 mg daily.    :1}     {Are you ordering a CV Procedure (e.g. stress test, cath, DCCV, TEE, etc)?   Press F2        :[id number]}  Dispo:  No follow-ups on file. Signed, Tereso Newcomer, PA-C

## 2023-03-28 ENCOUNTER — Ambulatory Visit: Payer: Medicare Other | Attending: Physician Assistant | Admitting: Physician Assistant

## 2023-03-28 ENCOUNTER — Encounter: Payer: Self-pay | Admitting: Physician Assistant

## 2023-03-28 VITALS — BP 120/70 | HR 62 | Ht 62.0 in | Wt 143.4 lb

## 2023-03-28 DIAGNOSIS — E7800 Pure hypercholesterolemia, unspecified: Secondary | ICD-10-CM

## 2023-03-28 DIAGNOSIS — I10 Essential (primary) hypertension: Secondary | ICD-10-CM | POA: Diagnosis not present

## 2023-03-28 DIAGNOSIS — I77810 Thoracic aortic ectasia: Secondary | ICD-10-CM

## 2023-03-28 DIAGNOSIS — J449 Chronic obstructive pulmonary disease, unspecified: Secondary | ICD-10-CM | POA: Diagnosis not present

## 2023-03-28 DIAGNOSIS — I359 Nonrheumatic aortic valve disorder, unspecified: Secondary | ICD-10-CM

## 2023-03-28 NOTE — Assessment & Plan Note (Signed)
Shortness of breath improved with Stiolto. F/u with pulmonology as planned.

## 2023-03-28 NOTE — Assessment & Plan Note (Signed)
41 mm on recent echocardiogram. Repeat echocardiogram planned in 1 year.

## 2023-03-28 NOTE — Assessment & Plan Note (Signed)
S/p bio AVR in 2016. TTE in 10/23 w normally functioning AVR. Continue SBE prophylaxis.

## 2023-03-28 NOTE — Assessment & Plan Note (Signed)
[facility] labs reviewed.  LDL from August 2023 was optimal at 77.  She remains on rosuvastatin.

## 2023-03-28 NOTE — Patient Instructions (Signed)
Medication Instructions:  Your physician recommends that you continue on your current medications as directed. Please refer to the Current Medication list given to you today.  *If you need a refill on your cardiac medications before your next appointment, please call your pharmacy*   Lab Work: None ordered  If you have labs (blood work) drawn today and your tests are completely normal, you will receive your results only by: MyChart Message (if you have MyChart) OR A paper copy in the mail If you have any lab test that is abnormal or we need to change your treatment, we will call you to review the results.   Testing/Procedures: None ordered   Follow-Up: At [facility], you and your health needs are our priority.  As part of our continuing mission to provide you with exceptional heart care, we have created designated Provider Care Teams.  These Care Teams include your primary Cardiologist (physician) and Advanced Practice Providers (APPs -  Physician Assistants and Nurse Practitioners) who all work together to provide you with the care you need, when you need it.  We recommend signing up for the patient portal called "MyChart".  Sign up information is provided on this After Visit Summary.  MyChart is used to connect with patients for Virtual Visits (Telemedicine).  Patients are able to view lab/test results, encounter notes, upcoming appointments, etc.  Non-urgent messages can be sent to your provider as well.   To learn more about what you can do with MyChart, go to [email or web address].    Your next appointment:   1 year(s)  Provider:   Michael Cooper, MD  or Scott Weaver, PA-C         Other Instructions 

## 2023-03-28 NOTE — Assessment & Plan Note (Addendum)
Her BP is well controlled. Continue Amlodipine 10 mg once daily.

## 2023-05-01 DIAGNOSIS — E7800 Pure hypercholesterolemia, unspecified: Secondary | ICD-10-CM | POA: Diagnosis not present

## 2023-05-01 DIAGNOSIS — I10 Essential (primary) hypertension: Secondary | ICD-10-CM | POA: Diagnosis not present

## 2023-05-01 DIAGNOSIS — Z952 Presence of prosthetic heart valve: Secondary | ICD-10-CM | POA: Diagnosis not present

## 2023-05-01 DIAGNOSIS — D509 Iron deficiency anemia, unspecified: Secondary | ICD-10-CM | POA: Diagnosis not present

## 2023-05-01 DIAGNOSIS — G43009 Migraine without aura, not intractable, without status migrainosus: Secondary | ICD-10-CM | POA: Diagnosis not present

## 2023-05-01 DIAGNOSIS — Z9181 History of falling: Secondary | ICD-10-CM | POA: Diagnosis not present

## 2023-05-01 DIAGNOSIS — Z139 Encounter for screening, unspecified: Secondary | ICD-10-CM | POA: Diagnosis not present

## 2023-05-30 ENCOUNTER — Other Ambulatory Visit: Payer: Self-pay

## 2023-05-30 MED ORDER — STIOLTO RESPIMAT 2.5-2.5 MCG/ACT IN AERS: 2.0000 | INHALATION_SPRAY | Freq: Every day | RESPIRATORY_TRACT | 4 refills | Status: DC

## 2023-06-07 DIAGNOSIS — M5416 Radiculopathy, lumbar region: Secondary | ICD-10-CM | POA: Diagnosis not present

## 2023-06-07 DIAGNOSIS — M4120 Other idiopathic scoliosis, site unspecified: Secondary | ICD-10-CM | POA: Diagnosis not present

## 2023-06-15 ENCOUNTER — Other Ambulatory Visit: Payer: Self-pay

## 2023-06-15 MED ORDER — AMLODIPINE BESYLATE 10 MG PO TABS: 10.0000 mg | ORAL_TABLET | Freq: Every day | ORAL | 2 refills | Status: DC

## 2023-06-15 NOTE — Telephone Encounter (Signed)
Pt's medication was sent to pt's pharmacy as requested. Confirmation received.  °

## 2023-06-28 DIAGNOSIS — S0083XA Contusion of other part of head, initial encounter: Secondary | ICD-10-CM | POA: Diagnosis not present

## 2023-06-28 DIAGNOSIS — J019 Acute sinusitis, unspecified: Secondary | ICD-10-CM | POA: Diagnosis not present

## 2023-07-09 DIAGNOSIS — W19XXXA Unspecified fall, initial encounter: Secondary | ICD-10-CM | POA: Diagnosis not present

## 2023-07-09 DIAGNOSIS — S0083XA Contusion of other part of head, initial encounter: Secondary | ICD-10-CM | POA: Diagnosis not present

## 2023-07-17 DIAGNOSIS — Z1231 Encounter for screening mammogram for malignant neoplasm of breast: Secondary | ICD-10-CM | POA: Diagnosis not present

## 2023-07-24 DIAGNOSIS — C773 Secondary and unspecified malignant neoplasm of axilla and upper limb lymph nodes: Secondary | ICD-10-CM | POA: Diagnosis not present

## 2023-07-24 DIAGNOSIS — C50111 Malignant neoplasm of central portion of right female breast: Secondary | ICD-10-CM | POA: Diagnosis not present

## 2023-07-24 DIAGNOSIS — Z170 Estrogen receptor positive status [ER+]: Secondary | ICD-10-CM | POA: Diagnosis not present

## 2023-07-25 DIAGNOSIS — H25013 Cortical age-related cataract, bilateral: Secondary | ICD-10-CM | POA: Diagnosis not present

## 2023-07-25 DIAGNOSIS — H524 Presbyopia: Secondary | ICD-10-CM | POA: Diagnosis not present

## 2023-07-25 DIAGNOSIS — H401123 Primary open-angle glaucoma, left eye, severe stage: Secondary | ICD-10-CM | POA: Diagnosis not present

## 2023-08-14 ENCOUNTER — Telehealth: Payer: Self-pay | Admitting: *Deleted

## 2023-08-14 DIAGNOSIS — H402212 Chronic angle-closure glaucoma, right eye, moderate stage: Secondary | ICD-10-CM | POA: Diagnosis not present

## 2023-08-14 DIAGNOSIS — H402223 Chronic angle-closure glaucoma, left eye, severe stage: Secondary | ICD-10-CM | POA: Diagnosis not present

## 2023-08-14 NOTE — Telephone Encounter (Signed)
   Pre-operative Risk Assessment    Patient Name: Stephanie Velasquez  DOB: 01-Oct-1956 MRN: [id number]    DATE OF LAST VISIT: 03/28/23 Tereso Newcomer, PAC DATE OF NEXT VISIT: NONE  Request for Surgical Clearance    Procedure:   LEFT CATARACT EXTRACTION AND GONIOSYNECHIALYSIS  Date of Surgery:  Clearance 09/10/23                                 Surgeon:  DR. Nada Libman Surgeon's Group or Practice Name:  [facility]  Phone number:  [phone] Fax number:  [phone]   Type of Clearance Requested:   - Medical ; ASA x 5-7 DAYS PRIOR   Type of Anesthesia:   IV SEDATION   Additional requests/questions:    Elpidio Anis   08/14/2023, 1:28 PM

## 2023-08-15 ENCOUNTER — Telehealth: Payer: Self-pay | Admitting: *Deleted

## 2023-08-15 NOTE — Telephone Encounter (Signed)
Pt has been scheduled for tele pre op appt 08/30/23 @ 1:40. Med rec and consent are done.     Patient Consent for Virtual Visit        Stephanie Velasquez has provided verbal consent on 08/15/2023 for a virtual visit (video or telephone).   CONSENT FOR VIRTUAL VISIT FOR:  Stephanie Velasquez  By participating in this virtual visit I agree to the following:  I hereby voluntarily request, consent and authorize [facility] and its employed or contracted physicians, physician assistants, nurse practitioners or other licensed health care professionals (the Practitioner), to provide me with telemedicine health care services (the "Services") as deemed necessary by the treating Practitioner. I acknowledge and consent to receive the Services by the Practitioner via telemedicine. I understand that the telemedicine visit will involve communicating with the Practitioner through live audiovisual communication technology and the disclosure of certain medical information by electronic transmission. I acknowledge that I have been given the opportunity to request an in-person assessment or other available alternative prior to the telemedicine visit and am voluntarily participating in the telemedicine visit.  I understand that I have the right to withhold or withdraw my consent to the use of telemedicine in the course of my care at any time, without affecting my right to future care or treatment, and that the Practitioner or I may terminate the telemedicine visit at any time. I understand that I have the right to inspect all information obtained and/or recorded in the course of the telemedicine visit and may receive copies of available information for a reasonable fee.  I understand that some of the potential risks of receiving the Services via telemedicine include:  Delay or interruption in medical evaluation due to technological equipment failure or disruption; Information transmitted may not be sufficient  (e.g. poor resolution of images) to allow for appropriate medical decision making by the Practitioner; and/or  In rare instances, security protocols could fail, causing a breach of personal health information.  Furthermore, I acknowledge that it is my responsibility to provide information about my medical history, conditions and care that is complete and accurate to the best of my ability. I acknowledge that Practitioner's advice, recommendations, and/or decision may be based on factors not within their control, such as incomplete or inaccurate data provided by me or distortions of diagnostic images or specimens that may result from electronic transmissions. I understand that the practice of medicine is not an exact science and that Practitioner makes no warranties or guarantees regarding treatment outcomes. I acknowledge that a copy of this consent can be made available to me via my patient portal [facility] MyChart), or I can request a printed copy by calling the office of [facility].    I understand that my insurance will be billed for this visit.   I have read or had this consent read to me. I understand the contents of this consent, which adequately explains the benefits and risks of the Services being provided via telemedicine.  I have been provided ample opportunity to ask questions regarding this consent and the Services and have had my questions answered to my satisfaction. I give my informed consent for the services to be provided through the use of telemedicine in my medical care

## 2023-08-15 NOTE — Telephone Encounter (Signed)
Pt has been scheduled for tele pre op appt 08/30/23 @ 1:40. Med rec and consent are done.

## 2023-08-15 NOTE — Telephone Encounter (Signed)
   Name: Stephanie Velasquez  DOB: 02/21/1956  MRN: [id number]  Primary Cardiologist: Tonny Bollman, MD   Preoperative team, please contact this patient and set up a phone call appointment for further preoperative risk assessment. Please obtain consent and complete medication review. Thank you for your help.  I confirm that guidance regarding antiplatelet and oral anticoagulation therapy has been completed and, if necessary, noted below.  Ideally aspirin should be continued without interruption, however if the bleeding risk is too great, aspirin may be held for 5-7 days prior to surgery. Please resume aspirin post operatively when it is felt to be safe from a bleeding standpoint.     Carlos Levering, NP 08/15/2023, 8:58 AM [facility]

## 2023-08-30 ENCOUNTER — Ambulatory Visit: Payer: Medicare Other | Attending: Internal Medicine | Admitting: Nurse Practitioner

## 2023-08-30 DIAGNOSIS — Z01810 Encounter for preprocedural cardiovascular examination: Secondary | ICD-10-CM | POA: Diagnosis not present

## 2023-08-30 NOTE — Progress Notes (Signed)
Virtual Visit via Telephone Note   Because of Stephanie Velasquez's co-morbid illnesses, she is at least at moderate risk for complications without adequate follow up.  This format is felt to be most appropriate for this patient at this time.  The patient did not have access to video technology/had technical difficulties with video requiring transitioning to audio format only (telephone).  All issues noted in this document were discussed and addressed.  No physical exam could be performed with this format.  Please refer to the patient's chart for her consent to telehealth for [facility].  Evaluation Performed:  Preoperative cardiovascular risk assessment _____________   Date:  08/30/2023   Patient ID:  Stephanie Velasquez, DOB February 21, 1956, MRN [id number] Patient Location:  Home Provider location:   Office  Primary Care Provider:  Lonie Velasquez, Stephanie Velasquez Primary Cardiologist:  Stephanie Bollman, Stephanie Velasquez  Chief Complaint / Patient Profile   67 y.o. y/o female with a h/o bicuspid with associated stenosis s/p bioprosthetic AVR in 2016, ascending aorta dilation, hypertension, hyperlipidemia, COPD, PUD, spinal stenosis, iron deficiency anemia, and breast cancer s/p lumpectomy who is pending  LEFT CATARACT EXTRACTION AND GONIOSYNECHIALYSIS on 09/10/2023 with Dr. Nada Velasquez of [facility] and presents today for telephonic preoperative cardiovascular risk assessment.  History of Present Illness    Stephanie Velasquez is a 67 y.o. female who presents via audio/video conferencing for a telehealth visit today.  Pt was last seen in cardiology clinic on 03/28/2023 by Stephanie Newcomer, Stephanie Velasquez.  At that time Stephanie Velasquez was doing well.  The patient is now pending procedure as outlined above. Since her last visit, she has done well from a cardiac standpoint.   She denies chest pain, palpitations, dyspnea, pnd, orthopnea, n, v, dizziness, syncope, edema, weight gain, or early satiety. All other systems  reviewed and are otherwise negative except as noted above.   Past Medical History    Past Medical History:  Diagnosis Date   Anxiety and depression    Aortic valve disease    a. s/p AVR 11/16;  b. Echo 11/16: mild LVH, vigorous LVF, no RWMA, bioprosthetic AVR ok withmean 13 mmHg and trivial AI, trivial effusion   Ascending aorta dilation (HCC)    Echo 11/22: EF 65-70, no RWMA, mild LVH, normal RVSF, RVSP 32.4, trivial MR, AVR with trivial AI, mean gradient 18 mmHg (stable), ascending aorta 41 mm   Breast cancer (HCC)    Dr Melvyn Neth Dr Lennon Alstrom, right side 2011   Carotid stenosis    a. Carotid US 10/16: bilateral ICA 1-39%   Diverticulosis    GERD (gastroesophageal reflux disease)    Nexium   Hearing difficulty of left ear    History of cardiac catheterization    a. LHC 9/16: normal coronary arteries with very large RCA   History of migraine headaches    Hypercholesterolemia    Simvastatin   Hypertension    Iron deficiency anemia    Osteoarthritis    hands   Overactive bladder    Paralysis of right upper extremity (HCC)    PUD (peptic ulcer disease)    Raynaud's syndrome    Spinal stenosis    Past Surgical History:  Procedure Laterality Date   AORTIC VALVE REPLACEMENT N/A 09/30/2015   Procedure: AORTIC VALVE REPLACEMENT (AVR);  Surgeon: Kerin Perna, Stephanie Velasquez;  Location: [facility] OR;  Service: Open Heart Surgery;  Laterality: N/A;   APPENDECTOMY     BREAST LUMPECTOMY WITH AXILLARY LYMPH NODE BIOPSY Right 2011   BREAST  SURGERY  2011, 2014   right side x 2   CARDIAC CATHETERIZATION N/A 09/03/2015   Procedure: Right/Left Heart Cath and Coronary Angiography;  Surgeon: Dolores Patty, Stephanie Velasquez;  Location: [facility] INVASIVE CV LAB;  Service: Cardiovascular;  Laterality: N/A;   CESAREAN SECTION     x 2   COLONOSCOPY  2014   ESOPHAGOGASTRODUODENOSCOPY     TEE WITHOUT CARDIOVERSION N/A 09/30/2015   Procedure: TRANSESOPHAGEAL ECHOCARDIOGRAM (TEE);  Surgeon: Kerin Perna, Stephanie Velasquez;  Location: [facility] OR;   Service: Open Heart Surgery;  Laterality: N/A;   TONSILLECTOMY     TRIGGER FINGER RELEASE     TUBAL LIGATION      Allergies  Allergies  Allergen Reactions   Naproxen Other (See Comments)    Feels like elephant is sitting on her chest. OK with smaller doses   Pregabalin     Other reaction(s): Other (See Comments) Pt states, it made her feel crazy.    Home Medications    Prior to Admission medications   Medication Sig Start Date End Date Taking? Authorizing Provider  amLODipine (NORVASC) 10 MG tablet Take 1 tablet (10 mg total) by mouth daily. 06/15/23   Stephanie Bollman, Stephanie Velasquez  aspirin 81 MG EC tablet Take 1 tablet (81 mg total) by mouth daily. 11/05/15   Stephanie Newcomer Velasquez, Stephanie Velasquez  Aspirin-Acetaminophen-Caffeine (GOODY HEADACHE PO) Take 1 each by mouth 2 (two) times daily as needed (PAIN).    Provider, Historical, Stephanie Velasquez  Brimonidine Tartrate 0.025 % SOLN Place 2 Drops/kg into both eyes 2 (two) times daily. 10/01/21   Provider, Historical, Stephanie Velasquez  buPROPion (WELLBUTRIN XL) 300 MG 24 hr tablet Take 300 mg by mouth daily.  04/14/19   Provider, Historical, Stephanie Velasquez  cyclobenzaprine (FLEXERIL) 10 MG tablet Take 10 mg by mouth 3 (three) times daily as needed for muscle spasms. 02/25/21   Provider, Historical, Stephanie Velasquez  dorzolamide-timolol (COSOPT) 2-0.5 % ophthalmic solution AS DIRECTED 06/14/23   Provider, Historical, Stephanie Velasquez  escitalopram (LEXAPRO) 20 MG tablet Take 20 mg by mouth daily. 03/21/21   Provider, Historical, Stephanie Velasquez  esomeprazole (NEXIUM) 40 MG capsule Take 40 mg by mouth daily at 12 noon.    Provider, Historical, Stephanie Velasquez  ferrous sulfate 325 (65 FE) MG tablet Take 1 tablet by mouth every other day.     Provider, Historical, Stephanie Velasquez  latanoprost (XALATAN) 0.005 % ophthalmic solution Place 1 drop into both eyes at bedtime.     Provider, Historical, Stephanie Velasquez  morphine (MS CONTIN) 15 MG 12 hr tablet Take 15 mg by mouth every 12 (twelve) hours.  04/14/19   Provider, Historical, Stephanie Velasquez  oxyCODONE-acetaminophen (PERCOCET) 7.5-325 MG per tablet  Take 1 tablet by mouth every 8 (eight) hours as needed for severe pain.    Provider, Historical, Stephanie Velasquez  promethazine (PHENERGAN) 25 MG tablet Take 25 mg by mouth every 6 (six) hours as needed for nausea or vomiting.    Provider, Historical, Stephanie Velasquez  rosuvastatin (CRESTOR) 20 MG tablet TAKE 1 TABLET BY MOUTH ONCE DAILY 10/09/22   Stephanie Bollman, Stephanie Velasquez  SUMAtriptan (IMITREX) 100 MG tablet Take 100 mg by mouth every 2 (two) hours as needed for migraine. May repeat in 2 hours if headache persists or recurs.    Provider, Historical, Stephanie Velasquez  Tiotropium Bromide-Olodaterol (STIOLTO RESPIMAT) 2.5-2.5 MCG/ACT AERS Inhale 2 puffs into the lungs daily. 05/30/23   Hunsucker, Lesia Sago, Stephanie Velasquez    Physical Exam    Vital Signs:  Stephanie Velasquez does not have vital signs available for review today.  Given  telephonic nature of communication, physical exam is limited. AAOx3. NAD. Normal affect.  Speech and respirations are unlabored.  Accessory Clinical Findings    None  Assessment & Plan    1.  Preoperative Cardiovascular Risk Assessment:  According to the Revised Cardiac Risk Index (RCRI), her Perioperative Risk of Major Cardiac Event is (%): 0.4. Her Functional Capacity in METs is: 6.27 according to the Duke Activity Status Index (DASI). Therefore, based on ACC/AHA guidelines, patient would be at acceptable risk for the planned procedure without further cardiovascular testing.  The patient was advised that if she develops new symptoms prior to surgery to contact our office to arrange for a follow-up visit, and she verbalized understanding.  Regarding ASA therapy, we recommend continuation of ASA throughout the perioperative period.  However, if the surgeon feels that cessation of ASA is required in the perioperative period, it may be stopped 5-7 days prior to surgery with a plan to resume it as soon as felt to be feasible from a surgical standpoint in the post-operative period.  A copy of this note will be routed to  requesting surgeon.  Time:   Today, I have spent 5 minutes with the patient with telehealth technology discussing medical history, symptoms, and management plan.     Joylene Grapes, NP  08/30/2023, 1:50 PM

## 2023-08-31 NOTE — Telephone Encounter (Signed)
Faxed over cardiac clearance office note to [facility].

## 2023-08-31 NOTE — Telephone Encounter (Signed)
Caller Morrie Sheldon) called to follow-up on status of patient's clearance and when patient will need to hold medication.

## 2023-09-06 DIAGNOSIS — M4120 Other idiopathic scoliosis, site unspecified: Secondary | ICD-10-CM | POA: Diagnosis not present

## 2023-09-06 DIAGNOSIS — M5416 Radiculopathy, lumbar region: Secondary | ICD-10-CM | POA: Diagnosis not present

## 2023-09-10 ENCOUNTER — Ambulatory Visit: Payer: Medicare Other | Admitting: Pulmonary Disease

## 2023-09-10 DIAGNOSIS — H402223 Chronic angle-closure glaucoma, left eye, severe stage: Secondary | ICD-10-CM | POA: Diagnosis not present

## 2023-09-10 DIAGNOSIS — H2512 Age-related nuclear cataract, left eye: Secondary | ICD-10-CM | POA: Diagnosis not present

## 2023-10-10 ENCOUNTER — Encounter: Payer: Self-pay | Admitting: Pulmonary Disease

## 2023-10-10 ENCOUNTER — Ambulatory Visit: Payer: Medicare Other | Admitting: Pulmonary Disease

## 2023-10-10 VITALS — BP 126/78 | HR 71 | Temp 98.5°F | Ht 61.0 in | Wt 148.4 lb

## 2023-10-10 DIAGNOSIS — J449 Chronic obstructive pulmonary disease, unspecified: Secondary | ICD-10-CM

## 2023-10-10 MED ORDER — STIOLTO RESPIMAT 2.5-2.5 MCG/ACT IN AERS: 2.0000 | INHALATION_SPRAY | Freq: Every day | RESPIRATORY_TRACT | Status: DC

## 2023-10-10 MED ORDER — STIOLTO RESPIMAT 2.5-2.5 MCG/ACT IN AERS: 2.0000 | INHALATION_SPRAY | Freq: Every day | RESPIRATORY_TRACT | 11 refills | Status: DC

## 2023-10-10 NOTE — Patient Instructions (Signed)
Like to see you again  Glad you are doing well  No changes to medicine, Stiolto refilled  Some samples of Stiolto today  Let me know about cost of the new year and we can help if needed  Return to clinic in 1 year or sooner as needed with Dr. Judeth Horn

## 2023-10-10 NOTE — Progress Notes (Signed)
@Patient  ID: Stephanie Velasquez, female    DOB: 1955/12/28, 67 y.o.   MRN: [id number]  Chief Complaint  Patient presents with  . Follow-up    Doing well with breathing.  Having back pain.    Referring provider: Lonie Peak, PA-C  HPI:   67 y.o. woman whom are seeing in follow-up for evaluation of dyspnea on exertion.  Most recent cardiology note x 2 reviewed.  She returns for routine follow-up.  Doing well.  Dyspnea stable.  Continues to remain improved with the addition of Stiolto.  Finds this beneficial.  No exacerbations since last visit.  HPI at initial visit: Patient has intermittent shortness of breath.  Really dyspnea exertion.  Nothing at rest.  She finds intermittently certain activities yield shortness of breath in the point she is to rest briefly that she can continue.  She gives examples of walking a dog.  Sometimes she walks outside in a few minutes she needs to stop, rest.  After that she can go about her normal 15 to 20-minute dog walk.  Most days, she can start and does not need to rest at all, does not need to stop and has no issues with her 15 to 20-minute walk.  Another example, recently went to a festival in [place].  Had to walk up steps 1 flight, to get out of the parking deck and up to the street.  Had to stop halfway up a flight of steps.  Rested briefly.  Then continue.  Then throughout the day he is fine walking around town, all around town.  Maybe some slight shortness of breath with inclines.  Otherwise no position that makes breathing better or worse.  No time of day to make things better or worse.  No seasonal or environmental factors she can identify that make things better or worse.  No real alleviating or exacerbating factors.  Most recent chest x-ray 08/2022 personally reviewed interpret as clear lungs bilaterally.  She had PFTs in 2016 that showed severe COPD, full interpretation below.  We discussed the intermittent nature of the symptoms are a bit odd.   Most cardiac or pulmonary contributors to symptoms are constant.  However, asthma would fit with intermittent symptoms.  On questioning, she indicates history of exercise intolerance or inability keep up with children the younger age and throughout high school etc.  She denies significant seasonal allergies but does tend to keep a chronic rhinitis.  We discussed her recent echocardiogram 09/2022 that on my review reveals good seated aortic valve replacement with minimal trivial regurgitation, normal LA size, normal RA size, normal RV size, normal RV function with mildly increased RVSP.  We discussed at length pulmonary hypertension, the contributing factors needs individual that we need to identify, and diagnostic studies needed to further evaluate.  PMH: Hypertension, hyperlipidemia, seasonal allergies, chronic headaches, anxiety, depression Surgical history: Aortic valve replacement 09/2015, tubal ligation, breast surgery, appendectomy Family history: Father with emphysema, CAD, brother and sister with CAD Social history: Former smoker, 40+ pack year, quit in 2016   Questionaires / Pulmonary Flowsheets:   ACT:      No data to display          MMRC:     No data to display          Epworth:      No data to display          Tests:   FENO:  No results found for: "NITRICOXIDE"  PFT:    Latest  Ref Rng & Units 09/24/2015    3:14 PM  PFT Results  FVC-Pre L 1.84   FVC-Predicted Pre % 62   FVC-Post L 1.92   FVC-Predicted Post % 64   Pre FEV1/FVC % % 61   Post FEV1/FCV % % 60   FEV1-Pre L 1.13   FEV1-Predicted Pre % 49   FEV1-Post L 1.16   DLCO uncorrected ml/min/mmHg 15.68   DLCO UNC% % 75   DLCO corrected ml/min/mmHg 16.88   DLCO COR %Predicted % 80   DLVA Predicted % 108   TLC L 4.86   TLC % Predicted % 103   RV % Predicted % 154   Personally reviewed and interpreted as spirometry indicative of severe fifth obstruction, lung volumes indicate air trapping, DLCO  mildly reduced  WALK:      No data to display          Imaging: Personally reviewed and as per EMR and discussion in this note No results found.  Lab Results: Personally reviewed CBC    Component Value Date/Time   WBC 4.2 09/05/2022 1137   WBC 6.7 10/05/2015 0222   RBC 4.52 09/05/2022 1137   RBC 3.25 (L) 10/05/2015 0222   HGB 13.6 09/05/2022 1137   HCT 41.3 09/05/2022 1137   PLT 216 09/05/2022 1137   MCV 91 09/05/2022 1137   MCH 30.1 09/05/2022 1137   MCH 28.9 10/05/2015 0222   MCHC 32.9 09/05/2022 1137   MCHC 32.0 10/05/2015 0222   RDW 12.6 09/05/2022 1137    BMET    Component Value Date/Time   NA 140 09/05/2022 1137   K 4.5 09/05/2022 1137   CL 100 09/05/2022 1137   CO2 27 09/05/2022 1137   GLUCOSE 106 (H) 09/05/2022 1137   GLUCOSE 99 10/04/2015 0241   BUN 17 09/05/2022 1137   CREATININE 0.70 09/05/2022 1137   CALCIUM 9.4 09/05/2022 1137   GFRNONAA >60 10/04/2015 0241   GFRAA >60 10/04/2015 0241    BNP No results found for: "BNP"  ProBNP    Component Value Date/Time   PROBNP 257 09/05/2022 1137    Specialty Problems       Pulmonary Problems   COPD (chronic obstructive pulmonary disease) (HCC)   Shortness of breath    Allergies  Allergen Reactions  . Naproxen Other (See Comments)    Feels like elephant is sitting on her chest. OK with smaller doses  . Pregabalin     Other reaction(s): Other (See Comments) Pt states, it made her feel crazy.    Immunization History  Administered Date(s) Administered  . Influenza,inj,Quad PF,6+ Mos 10/15/2017  . Influenza,inj,quad, With Preservative 09/25/2018  . Influenza-Unspecified 10/12/2022  . PNEUMOCOCCAL CONJUGATE-20 10/23/2022    Past Medical History:  Diagnosis Date  . Anxiety and depression   . Aortic valve disease    a. s/p AVR 11/16;  b. Echo 11/16: mild LVH, vigorous LVF, no RWMA, bioprosthetic AVR ok withmean 13 mmHg and trivial AI, trivial effusion  . Ascending aorta dilation (HCC)     Echo 11/22: EF 65-70, no RWMA, mild LVH, normal RVSF, RVSP 32.4, trivial MR, AVR with trivial AI, mean gradient 18 mmHg (stable), ascending aorta 41 mm  . Breast cancer [facility])    Dr Melvyn Neth Dr Lennon Alstrom, right side 2011  . Carotid stenosis    a. Carotid US 10/16: bilateral ICA 1-39%  . Diverticulosis   . GERD (gastroesophageal reflux disease)    Nexium  . Hearing difficulty of left ear   .  History of cardiac catheterization    a. LHC 9/16: normal coronary arteries with very large RCA  . History of migraine headaches   . Hypercholesterolemia    Simvastatin  . Hypertension   . Iron deficiency anemia   . Osteoarthritis    hands  . Overactive bladder   . Paralysis of right upper extremity (HCC)   . PUD (peptic ulcer disease)   . Raynaud's syndrome   . Spinal stenosis     Tobacco History: Social History   Tobacco Use  Smoking Status Former  . Current packs/day: 0.00  . Types: Cigarettes  . Quit date: 09/30/2015  . Years since quitting: 8.0  Smokeless Tobacco Never   Counseling given: Not Answered   Continue to not smoke  Outpatient Encounter Medications as of 10/10/2023  Medication Sig  . amLODipine (NORVASC) 10 MG tablet Take 1 tablet (10 mg total) by mouth daily.  Marland Kitchen aspirin 81 MG EC tablet Take 1 tablet (81 mg total) by mouth daily.  . Aspirin-Acetaminophen-Caffeine (GOODY HEADACHE PO) Take 1 each by mouth 2 (two) times daily as needed (PAIN).  . Brimonidine Tartrate 0.025 % SOLN Place 2 Drops/kg into both eyes 2 (two) times daily.  Marland Kitchen buPROPion (WELLBUTRIN XL) 300 MG 24 hr tablet Take 300 mg by mouth daily.   . dorzolamide-timolol (COSOPT) 2-0.5 % ophthalmic solution AS DIRECTED  . escitalopram (LEXAPRO) 20 MG tablet Take 20 mg by mouth daily.  Marland Kitchen esomeprazole (NEXIUM) 40 MG capsule Take 40 mg by mouth daily at 12 noon.  . ferrous sulfate 325 (65 FE) MG tablet Take 1 tablet by mouth every other day.   . latanoprost (XALATAN) 0.005 % ophthalmic solution Place 1 drop  into both eyes at bedtime.   Marland Kitchen oxyCODONE-acetaminophen (PERCOCET) 10-325 MG tablet Take 1 tablet by mouth every 6 (six) hours as needed for pain.  . rosuvastatin (CRESTOR) 20 MG tablet TAKE 1 TABLET BY MOUTH ONCE DAILY  . SUMAtriptan (IMITREX) 100 MG tablet Take 100 mg by mouth every 2 (two) hours as needed for migraine. May repeat in 2 hours if headache persists or recurs.  . [DISCONTINUED] Tiotropium Bromide-Olodaterol (STIOLTO RESPIMAT) 2.5-2.5 MCG/ACT AERS Inhale 2 puffs into the lungs daily.  . Tiotropium Bromide-Olodaterol (STIOLTO RESPIMAT) 2.5-2.5 MCG/ACT AERS Inhale 2 puffs into the lungs daily.  . [DISCONTINUED] cyclobenzaprine (FLEXERIL) 10 MG tablet Take 10 mg by mouth 3 (three) times daily as needed for muscle spasms. (Patient not taking: Reported on 10/10/2023)  . [DISCONTINUED] morphine (MS CONTIN) 15 MG 12 hr tablet Take 15 mg by mouth every 12 (twelve) hours.  (Patient not taking: Reported on 10/10/2023)  . [DISCONTINUED] oxyCODONE-acetaminophen (PERCOCET) 7.5-325 MG per tablet Take 1 tablet by mouth every 8 (eight) hours as needed for severe pain. (Patient not taking: Reported on 10/10/2023)  . [DISCONTINUED] promethazine (PHENERGAN) 25 MG tablet Take 25 mg by mouth every 6 (six) hours as needed for nausea or vomiting. (Patient not taking: Reported on 10/10/2023)   No facility-administered encounter medications on file as of 10/10/2023.     Review of Systems N/a Physical Exam  BP 126/78 (BP Location: Left Arm, Patient Position: Sitting, Cuff Size: Large)   Pulse 71   Temp 98.5 F (36.9 C) (Oral)   Ht 5\' 1"  (1.549 m)   Wt 148 lb 6.4 oz (67.3 kg)   SpO2 94%   BMI 28.04 kg/m   Wt Readings from Last 5 Encounters:  10/10/23 148 lb 6.4 oz (67.3 kg)  03/28/23 143 lb  6.4 oz (65 kg)  02/14/23 134 lb (60.8 kg)  10/30/22 134 lb 9.6 oz (61.1 kg)  09/05/22 136 lb 6.4 oz (61.9 kg)    BMI Readings from Last 5 Encounters:  10/10/23 28.04 kg/m  03/28/23 26.23 kg/m  02/14/23  24.51 kg/m  10/30/22 24.62 kg/m  09/05/22 25.77 kg/m     Physical Exam General: Sitting in chair, no acute distress Eyes: EOMI, no icterus Neck: Supple, no JVP Pulmonary: Clear, distant, normal work of breathing Cardiovascular: Warm, no edema Abdomen: Nondistended, bowel sounds present MSK: No synovitis, no joint effusion Neuro: Normal gait, no weakness Psych: Normal mood, full affect   Assessment & Plan:   Dyspnea on exertion: Intermittent.  Not reliably reproducible.  History of exercise intolerance as a child.  High suspicion for underlying asthma.  Likely contributed to or exacerbated or made worse in the setting of history of significant tobacco abuse.  Recent chest x-ray is clear without significant hyperinflation on my review and interpretation.  Recent labs demonstrate improved anemia.  Possible pulmonary hypertension is contributing as well.  Improvement with bronchodilators, Stiolto argues against significant pulmonary HTN.  COPD/asthma overlap: History of exercise intolerance as a child.  Severe COPD and 09/2015 pulmonary function test.  Not using maintenance inhaler.  Continue Stiolto 2 puffs daily given improvement in DOE.  Refill today.  Possible pulmonary hypertension: TTE 09/2022 with estimated elevated RVSP over 40.  Discussed at length further evaluation including repeat PFTs, CT high-resolution, polysomnography, overnight oximetry to further assess possible contributors.  After shared decision making we will focus on COPD/asthma for now. Given improvement with bronchodilators, defer additional work-up for pulmonary hypertension at this time.  Notably, her history of valvular dysfunction presents significant challenges with potential treatment.  Recent NT proBNP normal and normal RA size, RV size and RV function are relatively reassuring.  Return in about 1 year (around 10/09/2024) for f/u Dr. Judeth Horn.   Karren Burly, MD 10/10/2023

## 2023-10-19 ENCOUNTER — Other Ambulatory Visit: Payer: Self-pay

## 2023-10-19 MED ORDER — AMLODIPINE BESYLATE 10 MG PO TABS
10.0000 mg | ORAL_TABLET | Freq: Every day | ORAL | 1 refills | Status: DC
  Filled 2024-02-06: qty 90, 90d supply, fill #0

## 2023-10-19 MED ORDER — ROSUVASTATIN CALCIUM 20 MG PO TABS: 20.0000 mg | ORAL_TABLET | Freq: Every day | ORAL | 1 refills | Status: DC

## 2023-11-26 ENCOUNTER — Other Ambulatory Visit: Payer: Self-pay | Admitting: Cardiovascular Disease

## 2023-12-06 DIAGNOSIS — M5416 Radiculopathy, lumbar region: Secondary | ICD-10-CM | POA: Diagnosis not present

## 2023-12-06 DIAGNOSIS — M4120 Other idiopathic scoliosis, site unspecified: Secondary | ICD-10-CM | POA: Diagnosis not present

## 2023-12-25 ENCOUNTER — Other Ambulatory Visit: Payer: Self-pay

## 2023-12-25 ENCOUNTER — Other Ambulatory Visit (HOSPITAL_COMMUNITY): Payer: Self-pay

## 2023-12-26 ENCOUNTER — Other Ambulatory Visit (HOSPITAL_COMMUNITY): Payer: Self-pay

## 2023-12-26 ENCOUNTER — Other Ambulatory Visit: Payer: Self-pay

## 2023-12-26 MED ORDER — LATANOPROST 0.005 % OP SOLN: 1.0000 [drp] | Freq: Every day | OPHTHALMIC | 11 refills | Status: DC

## 2023-12-26 MED ORDER — ROSUVASTATIN CALCIUM 20 MG PO TABS
20.0000 mg | ORAL_TABLET | Freq: Every day | ORAL | 1 refills | Status: DC
  Filled 2024-02-06: qty 90, 90d supply, fill #0

## 2023-12-26 MED ORDER — DORZOLAMIDE HCL-TIMOLOL MAL 2-0.5 % OP SOLN: 1.0000 [drp] | Freq: Two times a day (BID) | OPHTHALMIC | 2 refills | Status: DC

## 2023-12-27 ENCOUNTER — Other Ambulatory Visit (HOSPITAL_COMMUNITY): Payer: Self-pay

## 2023-12-28 ENCOUNTER — Other Ambulatory Visit (HOSPITAL_COMMUNITY): Payer: Self-pay

## 2024-01-28 ENCOUNTER — Other Ambulatory Visit: Payer: Self-pay

## 2024-01-28 ENCOUNTER — Other Ambulatory Visit (HOSPITAL_COMMUNITY): Payer: Self-pay

## 2024-01-28 MED ORDER — DORZOLAMIDE HCL-TIMOLOL MAL 2-0.5 % OP SOLN
1.0000 [drp] | Freq: Two times a day (BID) | OPHTHALMIC | 2 refills | Status: AC
  Filled 2024-01-28: qty 30, 100d supply, fill #0
  Filled 2024-05-01: qty 20, 100d supply, fill #1
  Filled 2024-07-24: qty 20, 100d supply, fill #2

## 2024-01-28 MED ORDER — LATANOPROST 0.005 % OP SOLN
1.0000 [drp] | Freq: Every day | OPHTHALMIC | 2 refills | Status: AC
  Filled 2024-01-28: qty 10, 100d supply, fill #0
  Filled 2024-05-01: qty 10, 100d supply, fill #1
  Filled 2024-07-24: qty 10, 100d supply, fill #2

## 2024-01-28 MED ORDER — BRIMONIDINE TARTRATE 0.2 % OP SOLN
1.0000 [drp] | Freq: Two times a day (BID) | OPHTHALMIC | 3 refills | Status: AC
  Filled 2024-01-28: qty 15, 100d supply, fill #0
  Filled 2024-05-01: qty 15, 75d supply, fill #1
  Filled 2024-07-24: qty 15, 75d supply, fill #2

## 2024-02-01 ENCOUNTER — Other Ambulatory Visit (HOSPITAL_COMMUNITY): Payer: Self-pay

## 2024-02-07 ENCOUNTER — Other Ambulatory Visit (HOSPITAL_COMMUNITY): Payer: Self-pay

## 2024-02-13 DIAGNOSIS — M21611 Bunion of right foot: Secondary | ICD-10-CM | POA: Diagnosis not present

## 2024-02-13 DIAGNOSIS — E7800 Pure hypercholesterolemia, unspecified: Secondary | ICD-10-CM | POA: Diagnosis not present

## 2024-02-13 DIAGNOSIS — M25512 Pain in left shoulder: Secondary | ICD-10-CM | POA: Diagnosis not present

## 2024-02-13 DIAGNOSIS — M21612 Bunion of left foot: Secondary | ICD-10-CM | POA: Diagnosis not present

## 2024-02-13 DIAGNOSIS — Z952 Presence of prosthetic heart valve: Secondary | ICD-10-CM | POA: Diagnosis not present

## 2024-02-13 DIAGNOSIS — F418 Other specified anxiety disorders: Secondary | ICD-10-CM | POA: Diagnosis not present

## 2024-02-13 DIAGNOSIS — G43009 Migraine without aura, not intractable, without status migrainosus: Secondary | ICD-10-CM | POA: Diagnosis not present

## 2024-02-13 DIAGNOSIS — D509 Iron deficiency anemia, unspecified: Secondary | ICD-10-CM | POA: Diagnosis not present

## 2024-02-13 DIAGNOSIS — I10 Essential (primary) hypertension: Secondary | ICD-10-CM | POA: Diagnosis not present

## 2024-02-18 DIAGNOSIS — F1120 Opioid dependence, uncomplicated: Secondary | ICD-10-CM | POA: Diagnosis not present

## 2024-02-18 DIAGNOSIS — G43909 Migraine, unspecified, not intractable, without status migrainosus: Secondary | ICD-10-CM | POA: Diagnosis not present

## 2024-02-18 DIAGNOSIS — G8323 Monoplegia of upper limb affecting right nondominant side: Secondary | ICD-10-CM | POA: Diagnosis not present

## 2024-02-18 DIAGNOSIS — F330 Major depressive disorder, recurrent, mild: Secondary | ICD-10-CM | POA: Diagnosis not present

## 2024-02-18 DIAGNOSIS — E663 Overweight: Secondary | ICD-10-CM | POA: Diagnosis not present

## 2024-02-18 DIAGNOSIS — I10 Essential (primary) hypertension: Secondary | ICD-10-CM | POA: Diagnosis not present

## 2024-02-18 DIAGNOSIS — H9193 Unspecified hearing loss, bilateral: Secondary | ICD-10-CM | POA: Diagnosis not present

## 2024-02-18 DIAGNOSIS — J449 Chronic obstructive pulmonary disease, unspecified: Secondary | ICD-10-CM | POA: Diagnosis not present

## 2024-02-18 DIAGNOSIS — E785 Hyperlipidemia, unspecified: Secondary | ICD-10-CM | POA: Diagnosis not present

## 2024-02-18 DIAGNOSIS — G8929 Other chronic pain: Secondary | ICD-10-CM | POA: Diagnosis not present

## 2024-02-18 DIAGNOSIS — H409 Unspecified glaucoma: Secondary | ICD-10-CM | POA: Diagnosis not present

## 2024-02-18 DIAGNOSIS — F419 Anxiety disorder, unspecified: Secondary | ICD-10-CM | POA: Diagnosis not present

## 2024-02-21 ENCOUNTER — Ambulatory Visit (INDEPENDENT_AMBULATORY_CARE_PROVIDER_SITE_OTHER)

## 2024-02-21 ENCOUNTER — Ambulatory Visit: Payer: PPO | Admitting: Podiatry

## 2024-02-21 DIAGNOSIS — M21612 Bunion of left foot: Secondary | ICD-10-CM

## 2024-02-21 DIAGNOSIS — M21619 Bunion of unspecified foot: Secondary | ICD-10-CM

## 2024-02-21 DIAGNOSIS — M19072 Primary osteoarthritis, left ankle and foot: Secondary | ICD-10-CM | POA: Diagnosis not present

## 2024-02-21 DIAGNOSIS — M19071 Primary osteoarthritis, right ankle and foot: Secondary | ICD-10-CM | POA: Diagnosis not present

## 2024-02-21 DIAGNOSIS — M21611 Bunion of right foot: Secondary | ICD-10-CM | POA: Diagnosis not present

## 2024-02-21 NOTE — Progress Notes (Signed)
 Chief Complaint  Patient presents with   Bunions    Bilateral bunion, been bothering her since 2008. Right foot is the worst. She did not know insurance would help her with the bunions. Not diabetic and takes ASA 81   HPI: 67 y.o. female presents today for bunion evaluation.  She has pain at the bunions secondary to pressure from shoes.  She noted that she is not interested in surgery at this time.  Denies pain in the bunions when not wearing shoes.  Patient is interested in shoe recommendations.  She is wondering if shoes would be covered through our office for her feet.  Past Medical History:  Diagnosis Date   Anxiety and depression    Aortic valve disease    a. s/p AVR 11/16;  b. Echo 11/16: mild LVH, vigorous LVF, no RWMA, bioprosthetic AVR ok withmean 13 mmHg and trivial AI, trivial effusion   Ascending aorta dilation (HCC)    Echo 11/22: EF 65-70, no RWMA, mild LVH, normal RVSF, RVSP 32.4, trivial MR, AVR with trivial AI, mean gradient 18 mmHg (stable), ascending aorta 41 mm   Breast cancer (HCC)    Dr Melvyn Neth Dr Lennon Alstrom, right side 2011   Carotid stenosis    a. Carotid US 10/16: bilateral ICA 1-39%   Diverticulosis    GERD (gastroesophageal reflux disease)    Nexium   Hearing difficulty of left ear    History of cardiac catheterization    a. LHC 9/16: normal coronary arteries with very large RCA   History of migraine headaches    Hypercholesterolemia    Simvastatin   Hypertension    Iron deficiency anemia    Osteoarthritis    hands   Overactive bladder    Paralysis of right upper extremity (HCC)    PUD (peptic ulcer disease)    Raynaud's syndrome    Spinal stenosis     Past Surgical History:  Procedure Laterality Date   AORTIC VALVE REPLACEMENT N/A 09/30/2015   Procedure: AORTIC VALVE REPLACEMENT (AVR);  Surgeon: Kerin Perna, MD;  Location: [facility] OR;  Service: Open Heart Surgery;  Laterality: N/A;   APPENDECTOMY     BREAST LUMPECTOMY WITH AXILLARY LYMPH NODE  BIOPSY Right 2011   BREAST SURGERY  2011, 2014   right side x 2   CARDIAC CATHETERIZATION N/A 09/03/2015   Procedure: Right/Left Heart Cath and Coronary Angiography;  Surgeon: Dolores Patty, MD;  Location: [facility] INVASIVE CV LAB;  Service: Cardiovascular;  Laterality: N/A;   CESAREAN SECTION     x 2   COLONOSCOPY  2014   ESOPHAGOGASTRODUODENOSCOPY     TEE WITHOUT CARDIOVERSION N/A 09/30/2015   Procedure: TRANSESOPHAGEAL ECHOCARDIOGRAM (TEE);  Surgeon: Kerin Perna, MD;  Location: [facility] OR;  Service: Open Heart Surgery;  Laterality: N/A;   TONSILLECTOMY     TRIGGER FINGER RELEASE     TUBAL LIGATION      Allergies  Allergen Reactions   Naproxen Other (See Comments)    Feels like elephant is sitting on her chest. OK with smaller doses   Pregabalin     Other reaction(s): Other (See Comments) Pt states, it made her feel crazy.    PHYSICAL EXAM: General: The patient is alert and oriented x3 in no acute distress.  Dermatology: Skin is warm, dry and supple bilateral lower extremities. Interspaces are clear of maceration and debris.  No rashes noted.   Vascular: Palpable pedal pulses bilaterally. Capillary refill within normal limits.  No  appreciable edema.  No erythema or calor.  Neurological: Light touch sensation grossly intact bilateral feet.   Musculoskeletal Exam:  There is a bony medial prominence on the dorsomedial aspect of the 1st metatarsal head of the bilateral foot.  There is pain on palpation of the "bump" in this area.  Lateral deviation of the hallux at the MPJ level.  1st MPJ ROM is mildly decreased.  No crepitus.  Hallux is tracking, not trackbound.  These are reducible by manipulation  RADIOGRAPHIC EXAM (bilateral foot, 3 weightbearing views, 02/21/2024):  Increased first intermetatarsal angle 20-22 degrees bilateral.  Increased hallux abductus angle.  Tibial sesamoid position is 6 bilateral.  Enlargement of bone at dorsomedial 1st metatarsal head.  Joint space narrowed at  first MPJ bilateral.  Decreased calcaneal inclination angle.  Mild degenerative joint changes seen at the second and third metatarsal-cuneiform joints.  This is bilateral  ASSESSMENT/PLAN OF CARE: 1. Bunion, left foot   2. Bunion, right foot    Discussed patient's condition and possible etiologies today.  Discussed conservative treatment options with patient today, including shoe modification / arch supports, off-loading, cortisone injectione, NSAID topical / oral therapy, and toe splints and shields.    Did not discuss surgical intervention since patient noted she does not want to proceed with surgery.  She was given various toe spacers and toe splints to help hold the great toe in a better position when ambulating.  She was given a written list of shoes that should accommodate bunions better.  She can try to see if the [employer] [facility] store has these in stock.  Informed the patient that her insurance typically only covers shoes for diabetics.  Will reach out to our billing office to confirm this with health team advantage.  Return if symptoms worsen or fail to improve.    Clerance Lav, DPM, FACFAS [facility]     [address] [place], [place] [address]                Office [phone]  Fax [phone]

## 2024-02-22 DIAGNOSIS — M25512 Pain in left shoulder: Secondary | ICD-10-CM | POA: Diagnosis not present

## 2024-02-24 DIAGNOSIS — M21612 Bunion of left foot: Secondary | ICD-10-CM | POA: Insufficient documentation

## 2024-02-25 NOTE — Addendum Note (Signed)
 Addended byLucia Estelle D on: 02/25/2024 11:55 AM   Modules accepted: Orders

## 2024-03-03 ENCOUNTER — Encounter: Payer: Self-pay | Admitting: Pulmonary Disease

## 2024-03-03 ENCOUNTER — Other Ambulatory Visit (HOSPITAL_COMMUNITY): Payer: Self-pay

## 2024-03-03 MED ORDER — BUPROPION HCL ER (XL) 300 MG PO TB24
300.0000 mg | ORAL_TABLET | Freq: Every day | ORAL | 1 refills | Status: DC
  Filled 2024-03-03: qty 90, 90d supply, fill #0

## 2024-03-03 MED ORDER — ESCITALOPRAM OXALATE 20 MG PO TABS
20.0000 mg | ORAL_TABLET | Freq: Every day | ORAL | 1 refills | Status: DC
  Filled 2024-03-03: qty 90, 90d supply, fill #0

## 2024-03-05 DIAGNOSIS — M5416 Radiculopathy, lumbar region: Secondary | ICD-10-CM | POA: Diagnosis not present

## 2024-03-05 DIAGNOSIS — M4120 Other idiopathic scoliosis, site unspecified: Secondary | ICD-10-CM | POA: Diagnosis not present

## 2024-03-05 DIAGNOSIS — F1120 Opioid dependence, uncomplicated: Secondary | ICD-10-CM | POA: Diagnosis not present

## 2024-03-27 ENCOUNTER — Encounter: Payer: Self-pay | Admitting: Physician Assistant

## 2024-03-27 DIAGNOSIS — I2729 Other secondary pulmonary hypertension: Secondary | ICD-10-CM

## 2024-03-27 HISTORY — DX: Other secondary pulmonary hypertension: I27.29

## 2024-03-27 NOTE — Progress Notes (Unsigned)
 Cardiology Office Note:    Date:  03/28/2024  ID:  Stephanie Velasquez, DOB 1956/01/21, MRN [id number] PCP: Lonie Peak, PA-C  [facility] Providers Cardiologist:  Tonny Bollman, MD Cardiology APP:  Beatrice Lecher, PA-C       Patient Profile:      Bicuspid aortic valve with associated aortic stenosis  S/p bioprosthetic AVR in 10/16 Cath 9/16: no CAD  TTE 5/19: EF 65-70, AVR ok w mean 16 mmHg, PASP 39 TTE 11/16/21: EF 65-70, no RWMA, mild LVH, normal RVSF, normal PASP (RVSP 32.4), trivial MR, normally functioning AVR with mean gradient 18, dilated ascending aorta (41 mm)  TTE 09/27/22: EF 65-70, no RWMA, GLS -25.5, NL RVSF, mildly elevated PASP, RVSP 44.7, trivial MR, mild to mod TR, normally functioning AVR, mean 15, trivial AI, mild dilation of asc aorta (41 mm), RAP 8 Pulmonary hypertension  RVSP 09/2022: 44.7 Ascending aorta dilation Echo 11/22, 10/23: 41 mm Breast CA s/p lumpectomy Spinal stenosis PUD Hypertension Hyperlipidemia Iron deficiency anemia COPD Former smoker         Discussed the use of AI scribe software for clinical note transcription with the patient, who gave verbal consent to proceed.  History of Present Illness Stephanie Velasquez is a 68 y.o. female who returns for follow up of valvular heart disease. She was last seen in 03/2023. She has seen Dr. Judeth Horn with pulmonology. Notes reviewed. PFTs, high res CT, sleep testing all discussed with the pt but she preferred to continue management for COPD/asthma given improved symptoms on bronchodilators.   She is here alone.  She has not had any chest heaviness, pressure, or tightness. She denies passing out and can lay flat and breathe comfortably. She uses inhalers as needed, typically once a day, and experiences shortness of breath when bending over but no wheezing or chest heaviness. She reports occasional shortness of breath and mild swelling in her legs that resolves in the morning. She is not  currently smoking. Her blood pressure tends to run low at times, and she has experienced symptoms of lightheadedness.  She has chronic back pain, which has been worsening and now radiates down the backs of her legs, particularly in the mornings. 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 She receives care from a pain clinic and has been on disability due to her back issues.   ROS-See HPI    Studies Reviewed:   EKG Interpretation Date/Time:  Friday March 28 2024 09:11:52 EDT Ventricular Rate:  56 PR Interval:  114 QRS Duration:  90 QT Interval:  388 QTC Calculation: 374 R Axis:   41  Text Interpretation: Sinus bradycardia Nonspecific T wave abnormality No significant change since last tracing Confirmed by Tereso Newcomer [id number]) on 03/28/2024 9:49:53 AM    Results LABS Lipid Panel: Total cholesterol 180, HDL 83, LDL 82, triglycerides 84 (19/14/7829) Hb: 13.4 (02/13/2024) Cr: 0.56 (02/13/2024) K: 4.4 (02/13/2024) ALT: 18 (02/13/2024) mm (09/2022)    Risk Assessment/Calculations:             Physical Exam:   VS:  BP 110/60   Pulse (!) 56   Ht 5\' 1"  (1.549 m)   Wt 150 lb 9.6 oz (68.3 kg)   SpO2 96%   BMI 28.46 kg/m    Wt Readings from Last 3 Encounters:  03/28/24 150 lb 9.6 oz (68.3 kg)  10/10/23 148 lb 6.4 oz (67.3 kg)  03/28/23 143 lb 6.4 oz (65 kg)    Constitutional:      Appearance: Healthy appearance. Not in distress.  Neck:     Vascular: JVD normal.  Pulmonary:     Breath sounds: No wheezing. No rales.  Cardiovascular:     Normal rate. Regular rhythm.     Murmurs: There is a grade 2/6 systolic murmur at the URSB.  Edema:    Peripheral edema absent.        Assessment and Plan:   Assessment & Plan Aortic valve disease Status post bioprosthetic aortic valve replacement (AVR) in 2016 for bicuspid aortic valve with associated aortic stenosis. Currently asymptomatic. Last  echocardiogram in October 2023 showed a normally functioning AVR with a mean gradient of 15 mmHg. She is aware of the need for SBE prophylaxis with amoxicillin prior to dental work. - Continue SBE prophylaxis with amoxicillin 2 grams 30 to 60 minutes prior to dental work. - Arrange follow-up echocardiogram  - Follow-up in one year. Ascending aorta dilation (HCC) Ascending aortic dilation measuring 41 mm by echocardiogram in 2023.   - Arrange follow-up echocardiogram. Other secondary pulmonary hypertension (HCC) TTE in 09/2022 with RVSP 44.7. Pulmonology follow-up planned for later this year. Pulmonary function tests (PFTs), high-resolution CT, and sleep testing were discussed previously, but she preferred to continue management with inhaled medications due to improved symptoms. - Arrange follow-up echocardiogram   - Follow up with pulmonology as planned. Essential hypertension Blood pressure tends to run low at times, with occasional symptoms.  Advised to monitor blood pressure over the next couple of weeks. If consistently low, amlodipine dose may be reduced. - Monitor blood pressure over the next couple of weeks and send readings for review. - If blood pressure is too low, decrease amlodipine to 5 mg daily. Pure hypercholesterolemia LDL cholesterol is optimal. Lipid profile from February 2025 showed total cholesterol of 180 mg/dL, HDL of 83 mg/dL, LDL of 82 mg/dL, and triglycerides of 84 mg/dL. - Continue rosuvastatin (Crestor) 20 mg daily.         Dispo:  Return in about 1 year (around 03/28/2025) for Routine Follow Up, w/ Tereso Newcomer, PA-C.  Signed, Tereso Newcomer, PA-C

## 2024-03-28 ENCOUNTER — Encounter: Payer: Self-pay | Admitting: Physician Assistant

## 2024-03-28 ENCOUNTER — Ambulatory Visit: Payer: Medicare Other | Attending: Cardiology | Admitting: Physician Assistant

## 2024-03-28 VITALS — BP 110/60 | HR 56 | Ht 61.0 in | Wt 150.6 lb

## 2024-03-28 DIAGNOSIS — I77810 Thoracic aortic ectasia: Secondary | ICD-10-CM | POA: Diagnosis not present

## 2024-03-28 DIAGNOSIS — I359 Nonrheumatic aortic valve disorder, unspecified: Secondary | ICD-10-CM | POA: Diagnosis not present

## 2024-03-28 DIAGNOSIS — I2729 Other secondary pulmonary hypertension: Secondary | ICD-10-CM | POA: Diagnosis not present

## 2024-03-28 DIAGNOSIS — I10 Essential (primary) hypertension: Secondary | ICD-10-CM

## 2024-03-28 DIAGNOSIS — E7800 Pure hypercholesterolemia, unspecified: Secondary | ICD-10-CM | POA: Diagnosis not present

## 2024-03-28 NOTE — Assessment & Plan Note (Signed)
 Blood pressure tends to run low at times, with occasional symptoms.  Advised to monitor blood pressure over the next couple of weeks. If consistently low, amlodipine dose may be reduced. - Monitor blood pressure over the next couple of weeks and send readings for review. - If blood pressure is too low, decrease amlodipine to 5 mg daily.

## 2024-03-28 NOTE — Assessment & Plan Note (Signed)
 TTE in 09/2022 with RVSP 44.7. Pulmonology follow-up planned for later this year. Pulmonary function tests (PFTs), high-resolution CT, and sleep testing were discussed previously, but she preferred to continue management with inhaled medications due to improved symptoms. - Arrange follow-up echocardiogram   - Follow up with pulmonology as planned.

## 2024-03-28 NOTE — Assessment & Plan Note (Signed)
 Status post bioprosthetic aortic valve replacement (AVR) in 2016 for bicuspid aortic valve with associated aortic stenosis. Currently asymptomatic. Last echocardiogram in October 2023 showed a normally functioning AVR with a mean gradient of 15 mmHg. She is aware of the need for SBE prophylaxis with amoxicillin prior to dental work. - Continue SBE prophylaxis with amoxicillin 2 grams 30 to 60 minutes prior to dental work. - Arrange follow-up echocardiogram  - Follow-up in one year.

## 2024-03-28 NOTE — Assessment & Plan Note (Signed)
 LDL cholesterol is optimal. Lipid profile from February 2025 showed total cholesterol of 180 mg/dL, HDL of 83 mg/dL, LDL of 82 mg/dL, and triglycerides of 84 mg/dL. - Continue rosuvastatin (Crestor) 20 mg daily.

## 2024-03-28 NOTE — Assessment & Plan Note (Signed)
 Ascending aortic dilation measuring 41 mm by echocardiogram in 2023.   - Arrange follow-up echocardiogram.

## 2024-03-28 NOTE — Patient Instructions (Addendum)
 Medication Instructions:  Your physician recommends that you continue on your current medications as directed. Please refer to the Current Medication list given to you today.  *If you need a refill on your cardiac medications before your next appointment, please call your pharmacy*  Lab Work: None ordered.  You may go to any [facility] Location for your lab work:  [facility] - [address] ([facility]) - [address] [phone] [address]  [facility] - [address]   [place]  - [address]   [place] - [address] - [address] [employer]  - [address] ([facility]   If you have labs (blood work) drawn today and your tests are completely normal, you will receive your results only by: [employer] (if you have MyChart)  If you have any lab test that is abnormal or we need to change your treatment, we will call you or send a MyChart message to review the results.  Testing/Procedures: Your physician has requested that you have an echocardiogram. Echocardiography is a painless test that uses sound waves to create images of your heart. It provides your doctor with information about the size and shape of your heart and how well your heart's chambers and valves are working. This procedure takes approximately one hour. There are no restrictions for this procedure. Please do NOT wear cologne, perfume, aftershave, or lotions (deodorant is allowed). Please arrive 15 minutes prior to your appointment time.  Please note: We ask at that you not bring children with you during ultrasound (echo/ vascular) testing. Due to room size and safety concerns, children are not allowed in the ultrasound rooms during exams. Our front office staff cannot provide observation of children in our lobby area while testing is being conducted. An adult accompanying a patient  to their appointment will only be allowed in the ultrasound room at the discretion of the ultrasound technician under special circumstances. We apologize for any inconvenience.   Follow-Up:  Please keep record of your blood pressures for the next 2 weeks and send those in to [place]. You may call or send MyChart message.  At [facility], you and your health needs are our priority.  As part of our continuing mission to provide you with exceptional heart care, we have created designated Provider Care Teams.  These Care Teams include your primary Cardiologist (physician) and Advanced Practice Providers (APPs -  Physician Assistants and Nurse Practitioners) who all work together to provide you with the care you need, when you need it.   Your next appointment:   1 year(s)  The format for your next appointment:   In Person  Provider:   Tereso Newcomer, PA           Valet parking services will be available as well.

## 2024-04-08 ENCOUNTER — Encounter: Payer: Self-pay | Admitting: Pulmonary Disease

## 2024-04-08 ENCOUNTER — Other Ambulatory Visit: Payer: Self-pay

## 2024-04-08 MED ORDER — STIOLTO RESPIMAT 2.5-2.5 MCG/ACT IN AERS
2.0000 | INHALATION_SPRAY | Freq: Every day | RESPIRATORY_TRACT | 3 refills | Status: AC
  Filled 2024-04-08: qty 4, 30d supply, fill #0
  Filled 2024-05-26: qty 4, 30d supply, fill #1
  Filled 2024-07-17: qty 4, 30d supply, fill #2

## 2024-04-09 ENCOUNTER — Other Ambulatory Visit: Payer: Self-pay

## 2024-04-09 ENCOUNTER — Other Ambulatory Visit (HOSPITAL_COMMUNITY): Payer: Self-pay

## 2024-04-24 ENCOUNTER — Other Ambulatory Visit: Payer: Self-pay

## 2024-04-24 ENCOUNTER — Other Ambulatory Visit (HOSPITAL_COMMUNITY): Payer: Self-pay

## 2024-04-24 ENCOUNTER — Other Ambulatory Visit: Payer: Self-pay | Admitting: Cardiovascular Disease

## 2024-04-24 ENCOUNTER — Other Ambulatory Visit: Payer: Self-pay | Admitting: Physician Assistant

## 2024-04-24 MED ORDER — ROSUVASTATIN CALCIUM 20 MG PO TABS
20.0000 mg | ORAL_TABLET | Freq: Every day | ORAL | 3 refills | Status: AC
  Filled 2024-04-24: qty 90, 90d supply, fill #0
  Filled 2024-07-24: qty 90, 90d supply, fill #1
  Filled 2024-10-26: qty 90, 90d supply, fill #2
  Filled 2025-01-17: qty 90, 90d supply, fill #3

## 2024-04-24 MED ORDER — AMLODIPINE BESYLATE 10 MG PO TABS
10.0000 mg | ORAL_TABLET | Freq: Every day | ORAL | 3 refills | Status: AC
  Filled 2024-04-24: qty 90, 90d supply, fill #0
  Filled 2024-07-24: qty 90, 90d supply, fill #1
  Filled 2024-10-26: qty 90, 90d supply, fill #2

## 2024-04-25 ENCOUNTER — Other Ambulatory Visit (HOSPITAL_COMMUNITY): Payer: Self-pay

## 2024-05-01 ENCOUNTER — Other Ambulatory Visit (HOSPITAL_COMMUNITY): Payer: Self-pay

## 2024-05-26 ENCOUNTER — Other Ambulatory Visit (HOSPITAL_COMMUNITY): Payer: Self-pay

## 2024-05-26 ENCOUNTER — Other Ambulatory Visit: Payer: Self-pay

## 2024-05-26 MED ORDER — BUPROPION HCL ER (XL) 300 MG PO TB24
300.0000 mg | ORAL_TABLET | Freq: Every day | ORAL | 1 refills | Status: AC
  Filled 2024-05-29: qty 90, 90d supply, fill #0
  Filled 2024-07-24 – 2024-10-26 (×2): qty 90, 90d supply, fill #1

## 2024-05-26 MED ORDER — ESCITALOPRAM OXALATE 20 MG PO TABS
20.0000 mg | ORAL_TABLET | Freq: Every day | ORAL | 1 refills | Status: DC
  Filled 2024-05-29: qty 90, 90d supply, fill #0
  Filled 2024-07-24 – 2024-08-27 (×2): qty 90, 90d supply, fill #1

## 2024-05-27 ENCOUNTER — Other Ambulatory Visit (HOSPITAL_COMMUNITY): Payer: Self-pay

## 2024-05-27 ENCOUNTER — Telehealth: Payer: Self-pay | Admitting: Pulmonary Disease

## 2024-05-27 ENCOUNTER — Telehealth: Payer: Self-pay | Admitting: *Deleted

## 2024-05-27 DIAGNOSIS — M4120 Other idiopathic scoliosis, site unspecified: Secondary | ICD-10-CM | POA: Diagnosis not present

## 2024-05-27 DIAGNOSIS — M5416 Radiculopathy, lumbar region: Secondary | ICD-10-CM | POA: Diagnosis not present

## 2024-05-27 DIAGNOSIS — F1120 Opioid dependence, uncomplicated: Secondary | ICD-10-CM | POA: Diagnosis not present

## 2024-05-27 NOTE — Telephone Encounter (Signed)
 Called [facility] outpatient pharmacy and spoke with Polly Brink, he states that the Stiolto was filled yesterday, 05/26/2024 and shipped out yesterday.  He tracked the package and is out for delivery today.  He said her copay is $47, she got it filled on April 24th.  He said she could probably save some money getting a 3 month supply if she wanted if filled that way, it would either be $94 or $141 for a 3 month supply.  I asked if she had any refills and he stated she had 2 refills.  Advised I would call her and let her know.  Called and spoke with patient, apologized that I was not available when she came by earlier.  She was wanting a sample of Stiolto.  I advised her that we cannot continue to provide samples to patients that have a prescription for the medication.  I let her know that if the medication is not affordable, we have patient's apply for patient assistance or find a medication that is more affordable.  She said she was hoping to go a month or 2 without paying for it.  She said she applied for patient assistance and dropped off the paperwork.  I let her know that her prescription for Stiolto had been filled and shipped out on 05/26/24 and was out for delivery today.  I told her that we do not get that many samples and the one's we do have we give to our patient's that we are starting a new inhaler on to see if it works before we send in a prescription to their pharmacy.  She said something I did not understand and then the call was disconnected from her end.  Closing encounter.  Nothing further needed.  Dr. Marygrace Snellen, We are unable to continue to provide samples to patient as she has a prescription that has been filled at her pharmacy and I verified that the cost is $47.  She did drop off patient assistance forms and we will see if she qualifies.  I just wanted to make you aware. Thank you.

## 2024-05-27 NOTE — Telephone Encounter (Signed)
 Inhaler PAP managed by CMA

## 2024-05-27 NOTE — Telephone Encounter (Signed)
 PT presented to front desk to drop off ppwk for [employer] Pt Assistance form. Made her a copy and put in RX box.

## 2024-05-27 NOTE — Telephone Encounter (Signed)
 Devki returned to front and said

## 2024-05-29 ENCOUNTER — Other Ambulatory Visit (HOSPITAL_COMMUNITY): Payer: Self-pay

## 2024-05-29 ENCOUNTER — Other Ambulatory Visit: Payer: Self-pay

## 2024-05-30 ENCOUNTER — Other Ambulatory Visit: Payer: Self-pay

## 2024-06-19 NOTE — Telephone Encounter (Signed)
 PT has not had return call from 6/10 encounter and the question she had about  the no she got from us  on second to last encounter about samples.

## 2024-06-24 NOTE — Telephone Encounter (Signed)
 Pt states she applied for the PAP for Stiolto and she does have a RX but she can not afford to go get it. We are unable to provide a sample of Stiolto. I informed pt that I would check with the RX team to see how fast we can have the PAP faxed over. Pt verbalized understanding.

## 2024-06-25 ENCOUNTER — Telehealth: Payer: Self-pay

## 2024-06-25 NOTE — Telephone Encounter (Signed)
 Copied from CRM [id number]. Topic: Clinical - Prescription Issue >> Jun 24, 2024 10:58 AM Stephanie Velasquez wrote: Reason for CRM: Pt is calling stating she dropped off paperwork on 6/10 for [employer] care assistance program. She stated she received a text on 7/1 stating the paperwork was missing the doctor's portion. Pt is upset and wanting to know why it wasn't completely filed out by her provider Dr. Annella. Pt stated she was told a couple times by a nurse at the clinic she would get a call back, but never did. Please call the patient back regarding this concern at [phone]. Pt disconnected when I had her on hold. >> Jun 24, 2024 11:17 AM Joesph PARAS wrote: Patient is returning call from dropped line. Patient is requesting update on paperwork for patient assistance. Contacted [employer] to inquire about status, as last note states they received paperwork from patient for completion on 06/10 and no update has been documented since.   Patient is missing provider's portion of paperwork and needs it completed and sent to determine if she qualifies for patient assistance. Spoke to [employer], CAL states paperwork is unknown to both nurses and they are requesting an encounter to be able to locate and proceed.   Relayed to patient that paperwork seems to have been held up but that clinical staff will be locating and addressing shortly. Patient inquiring if she can get a sample in the mean time. Per previous chart note, informed patient that PAS can send inquiry but that PAS cannot say if inquiry will be affirmed or denied.    From Burnard lather  This was faxed on 06-17-24 after being signed by Dr Annella, and fax was received. [employer] was suppose to reach out to pt. ATC pt x1. Unable to lvm.

## 2024-06-25 NOTE — Telephone Encounter (Signed)
 Copied from CRM [id number]. Topic: Clinical - Prescription Issue >> Jun 24, 2024 10:58 AM Celestine FALCON wrote: Reason for CRM: Pt is calling stating she dropped off paperwork on 6/10 for [employer] care assistance program. She stated she received a text on 7/1 stating the paperwork was missing the doctor's portion. Pt is upset and wanting to know why it wasn't completely filed out by her provider Dr. Annella. Pt stated she was told a couple times by a nurse at the clinic she would get a call back, but never did. Please call the patient back regarding this concern at [phone]. Pt disconnected when I had her on hold. >> Jun 24, 2024 11:17 AM Joesph PARAS wrote: Patient is returning call from dropped line. Patient is requesting update on paperwork for patient assistance. Contacted [employer] to inquire about status, as last note states they received paperwork from patient for completion on 06/10 and no update has been documented since.   Patient is missing provider's portion of paperwork and needs it completed and sent to determine if she qualifies for patient assistance. Spoke to CAL, CAL states paperwork is unknown to both nurses and they are requesting an encounter to be able to locate and proceed.   Relayed to patient that paperwork seems to have been held up but that clinical staff will be locating and addressing shortly. Patient inquiring if she can get a sample in the mean time. Per previous chart note, informed patient that PAS can send inquiry but that PAS cannot say if inquiry will be affirmed or denied.     An encounter was already made for this. Do not need duplicated messages. A message was routed to the RX team to see if there was anything they can do for this. If it is just a for for the physician, we can just fax another one over without needing another signature from the pt.

## 2024-06-25 NOTE — Telephone Encounter (Signed)
 I can route a message to Dr [facility] nurse to see if she knows anything about this.

## 2024-06-26 NOTE — Telephone Encounter (Signed)
 Called and spoke ot patient. She is requesting update on [employer] patient assistance application. Per patient, BI stated that providers portion was not completed.I have located the application and it appears that the Rx portion was not completed.  Patient stated that she is almost out of her inhaler and she can not afford her Rx.  She is upset that she was told that our office could not provide her with a sample. Pt began crying stating that she is not asking for a handout. I apologized to patient and assured her that we are here to help her.   I have spoken to [facility] with [employer], who verified that Rx portion is needed. I have completed this portion and faxed the application back to [employer]. Received successful fax confirmation.   Called pt back and advised her of the above. I have offered a sample and she declined, as she [place] away. She also made the statement to not worry about her, to tell her the nurse who previously told her she could not have a sample, to worry about other patients. I again apologized to patient for the misunderstanding and asked her to call  back for a sample if she runs out of medication before BI mails her a Rx.

## 2024-07-01 NOTE — Telephone Encounter (Signed)
 Called patient for update. She stated that she has not been contacted by [facility].  I have contacted [employer] and spoke to [place]. She stated that the prescriber information is missing, which is pages 4 and 5.  I have re faxed pages 4 and 5. Will continue to follow up.

## 2024-07-07 NOTE — Telephone Encounter (Addendum)
 Spoke to patient. She stated that she has not been contacted by [facility].   I have spoken to Potter with [employer] for an update. She stated that fax was received, however it appears that both pages including the fax cover sheet were not faxed separately, so it came through as one page.  Pages 4 and 5 will need to be re faxed.   Routing to [employer] to [employer], as she has a copy of the application and she is off today.

## 2024-07-08 NOTE — Telephone Encounter (Signed)
 After being faxed again last time it was sent to be  scanned

## 2024-07-17 ENCOUNTER — Other Ambulatory Visit (HOSPITAL_COMMUNITY): Payer: Self-pay

## 2024-07-24 ENCOUNTER — Other Ambulatory Visit: Payer: Self-pay

## 2024-08-06 ENCOUNTER — Ambulatory Visit (HOSPITAL_COMMUNITY)
Admission: RE | Admit: 2024-08-06 | Discharge: 2024-08-06 | Disposition: A | Discharge status: Home / Self Care | Source: Ambulatory Visit | Attending: Cardiovascular Disease | Admitting: Cardiovascular Disease

## 2024-08-06 DIAGNOSIS — I359 Nonrheumatic aortic valve disorder, unspecified: Secondary | ICD-10-CM

## 2024-08-06 DIAGNOSIS — I77810 Thoracic aortic ectasia: Secondary | ICD-10-CM | POA: Insufficient documentation

## 2024-08-06 DIAGNOSIS — I2729 Other secondary pulmonary hypertension: Secondary | ICD-10-CM

## 2024-08-07 DIAGNOSIS — R92323 Mammographic fibroglandular density, bilateral breasts: Secondary | ICD-10-CM | POA: Diagnosis not present

## 2024-08-07 DIAGNOSIS — Z1231 Encounter for screening mammogram for malignant neoplasm of breast: Secondary | ICD-10-CM | POA: Diagnosis not present

## 2024-08-07 LAB — ECHOCARDIOGRAM COMPLETE
AR max vel: 1.58 cm2
AV Area VTI: 1.79 cm2
AV Area mean vel: 1.57 cm2
AV Mean grad: 15 mmHg
AV Peak grad: 27.2 mmHg
Ao pk vel: 2.61 m/s
Area-P 1/2: 6.27 cm2
S' Lateral: 3.2 cm

## 2024-08-08 ENCOUNTER — Ambulatory Visit: Payer: Self-pay | Admitting: Physician Assistant

## 2024-08-08 DIAGNOSIS — I77810 Thoracic aortic ectasia: Secondary | ICD-10-CM

## 2024-08-08 DIAGNOSIS — I359 Nonrheumatic aortic valve disorder, unspecified: Secondary | ICD-10-CM

## 2024-08-12 ENCOUNTER — Other Ambulatory Visit: Payer: Self-pay

## 2024-08-12 ENCOUNTER — Other Ambulatory Visit (HOSPITAL_COMMUNITY): Payer: Self-pay

## 2024-08-12 DIAGNOSIS — Z1331 Encounter for screening for depression: Secondary | ICD-10-CM | POA: Diagnosis not present

## 2024-08-12 DIAGNOSIS — J449 Chronic obstructive pulmonary disease, unspecified: Secondary | ICD-10-CM | POA: Diagnosis not present

## 2024-08-12 DIAGNOSIS — F418 Other specified anxiety disorders: Secondary | ICD-10-CM | POA: Diagnosis not present

## 2024-08-12 DIAGNOSIS — M25512 Pain in left shoulder: Secondary | ICD-10-CM | POA: Diagnosis not present

## 2024-08-12 DIAGNOSIS — D509 Iron deficiency anemia, unspecified: Secondary | ICD-10-CM | POA: Diagnosis not present

## 2024-08-12 DIAGNOSIS — E7800 Pure hypercholesterolemia, unspecified: Secondary | ICD-10-CM | POA: Diagnosis not present

## 2024-08-12 DIAGNOSIS — Z952 Presence of prosthetic heart valve: Secondary | ICD-10-CM | POA: Diagnosis not present

## 2024-08-12 DIAGNOSIS — I272 Pulmonary hypertension, unspecified: Secondary | ICD-10-CM | POA: Diagnosis not present

## 2024-08-12 DIAGNOSIS — G43009 Migraine without aura, not intractable, without status migrainosus: Secondary | ICD-10-CM | POA: Diagnosis not present

## 2024-08-12 DIAGNOSIS — I10 Essential (primary) hypertension: Secondary | ICD-10-CM | POA: Diagnosis not present

## 2024-08-12 DIAGNOSIS — Z9181 History of falling: Secondary | ICD-10-CM | POA: Diagnosis not present

## 2024-08-12 DIAGNOSIS — Z139 Encounter for screening, unspecified: Secondary | ICD-10-CM | POA: Diagnosis not present

## 2024-08-12 MED ORDER — SUMATRIPTAN SUCCINATE 100 MG PO TABS
ORAL_TABLET | ORAL | 0 refills | Status: AC
  Filled 2024-08-12: qty 27, 90d supply, fill #0

## 2024-08-25 DIAGNOSIS — Z1211 Encounter for screening for malignant neoplasm of colon: Secondary | ICD-10-CM | POA: Diagnosis not present

## 2024-08-25 DIAGNOSIS — C50111 Malignant neoplasm of central portion of right female breast: Secondary | ICD-10-CM | POA: Diagnosis not present

## 2024-08-25 DIAGNOSIS — N6012 Diffuse cystic mastopathy of left breast: Secondary | ICD-10-CM | POA: Diagnosis not present

## 2024-08-25 DIAGNOSIS — C773 Secondary and unspecified malignant neoplasm of axilla and upper limb lymph nodes: Secondary | ICD-10-CM | POA: Diagnosis not present

## 2024-08-25 DIAGNOSIS — Z853 Personal history of malignant neoplasm of breast: Secondary | ICD-10-CM | POA: Diagnosis not present

## 2024-08-25 DIAGNOSIS — N6011 Diffuse cystic mastopathy of right breast: Secondary | ICD-10-CM | POA: Diagnosis not present

## 2024-08-25 DIAGNOSIS — Z170 Estrogen receptor positive status [ER+]: Secondary | ICD-10-CM | POA: Diagnosis not present

## 2024-08-27 ENCOUNTER — Other Ambulatory Visit (HOSPITAL_COMMUNITY): Payer: Self-pay

## 2024-09-01 DIAGNOSIS — F1120 Opioid dependence, uncomplicated: Secondary | ICD-10-CM | POA: Diagnosis not present

## 2024-09-01 DIAGNOSIS — M4120 Other idiopathic scoliosis, site unspecified: Secondary | ICD-10-CM | POA: Diagnosis not present

## 2024-09-01 DIAGNOSIS — M5416 Radiculopathy, lumbar region: Secondary | ICD-10-CM | POA: Diagnosis not present

## 2024-09-22 ENCOUNTER — Encounter: Payer: Self-pay | Admitting: Pulmonary Disease

## 2024-09-22 ENCOUNTER — Ambulatory Visit: Admitting: Pulmonary Disease

## 2024-09-22 VITALS — BP 118/78 | HR 61 | Temp 97.8°F | Ht 61.0 in | Wt 144.6 lb

## 2024-09-22 DIAGNOSIS — J449 Chronic obstructive pulmonary disease, unspecified: Secondary | ICD-10-CM | POA: Diagnosis not present

## 2024-09-22 NOTE — Patient Instructions (Addendum)
 Nice to see you again  I provided samples of Stiolto  We will fax back and I will refill out the doctor's portion of the manufacturing assistance paperwork  Return to clinic in 1 year or sooner as needed

## 2024-09-22 NOTE — Progress Notes (Signed)
 @Patient  ID: Stephanie Velasquez, female    DOB: [birth year], 68 y.o.   MRN: [id number]  No chief complaint on file.   Referring provider: Montey Lot, PA-C  HPI:   68 y.o. woman whom are seeing in follow-up for evaluation of dyspnea on exertion.  Most recent cardiology note reviewed.  Multiple telephone encounter notes reviewed.  She returns for routine follow-up.  Doing well.  Dyspnea stable.  Continues to remain improved with the addition of Stiolto.  Finds this beneficial.  No exacerbations since last visit.  Unfortunately, Stiolto manufacture assistance is yet to be approved.  Despite our office faxing paperwork in multiple times.  HPI at initial visit: Patient has intermittent shortness of breath.  Really dyspnea exertion.  Nothing at rest.  She finds intermittently certain activities yield shortness of breath in the point she is to rest briefly that she can continue.  She gives examples of walking a dog.  Sometimes she walks outside in a few minutes she needs to stop, rest.  After that she can go about her normal 15 to 20-minute dog walk.  Most days, she can start and does not need to rest at all, does not need to stop and has no issues with her 15 to 20-minute walk.  Another example, recently went to a festival in [place].  Had to walk up steps 1 flight, to get out of the parking deck and up to the street.  Had to stop halfway up a flight of steps.  Rested briefly.  Then continue.  Then throughout the day he is fine walking around town, all around town.  Maybe some slight shortness of breath with inclines.  Otherwise no position that makes breathing better or worse.  No time of day to make things better or worse.  No seasonal or environmental factors she can identify that make things better or worse.  No real alleviating or exacerbating factors.  Most recent chest x-ray 08/2022 personally reviewed interpret as clear lungs bilaterally.  She had PFTs in 2016 that showed severe COPD, full  interpretation below.  We discussed the intermittent nature of the symptoms are a bit odd.  Most cardiac or pulmonary contributors to symptoms are constant.  However, asthma would fit with intermittent symptoms.  On questioning, she indicates history of exercise intolerance or inability keep up with children the younger age and throughout high school etc.  She denies significant seasonal allergies but does tend to keep a chronic rhinitis.  We discussed her recent echocardiogram 09/2022 that on my review reveals good seated aortic valve replacement with minimal trivial regurgitation, normal LA size, normal RA size, normal RV size, normal RV function with mildly increased RVSP.  We discussed at length pulmonary hypertension, the contributing factors needs individual that we need to identify, and diagnostic studies needed to further evaluate.  PMH: Hypertension, hyperlipidemia, seasonal allergies, chronic headaches, anxiety, depression Surgical history: Aortic valve replacement 09/2015, tubal ligation, breast surgery, appendectomy Family history: Father with emphysema, CAD, brother and sister with CAD Social history: Former smoker, 40+ pack year, quit in 2016   Questionaires / Pulmonary Flowsheets:   ACT:      No data to display          MMRC:     No data to display          Epworth:      No data to display          Tests:   FENO:  No results found for:  NITRICOXIDE  PFT:    Latest Ref Rng & Units 09/24/2015    3:14 PM  PFT Results  FVC-Pre L 1.84   FVC-Predicted Pre % 62   FVC-Post L 1.92   FVC-Predicted Post % 64   Pre FEV1/FVC % % 61   Post FEV1/FCV % % 60   FEV1-Pre L 1.13   FEV1-Predicted Pre % 49   FEV1-Post L 1.16   DLCO uncorrected ml/min/mmHg 15.68   DLCO UNC% % 75   DLCO corrected ml/min/mmHg 16.88   DLCO COR %Predicted % 80   DLVA Predicted % 108   TLC L 4.86   TLC % Predicted % 103   RV % Predicted % 154   Personally reviewed and interpreted  as spirometry indicative of severe fifth obstruction, lung volumes indicate air trapping, DLCO mildly reduced  WALK:      No data to display          Imaging: Personally reviewed and as per EMR and discussion in this note No results found.  Lab Results: Personally reviewed CBC    Component Value Date/Time   WBC 4.2 09/05/2022 1137   WBC 6.7 10/05/2015 0222   RBC 4.52 09/05/2022 1137   RBC 3.25 (L) 10/05/2015 0222   HGB 13.6 09/05/2022 1137   HCT 41.3 09/05/2022 1137   PLT 216 09/05/2022 1137   MCV 91 09/05/2022 1137   MCH 30.1 09/05/2022 1137   MCH 28.9 10/05/2015 0222   MCHC 32.9 09/05/2022 1137   MCHC 32.0 10/05/2015 0222   RDW 12.6 09/05/2022 1137    BMET    Component Value Date/Time   NA 140 09/05/2022 1137   K 4.5 09/05/2022 1137   CL 100 09/05/2022 1137   CO2 27 09/05/2022 1137   GLUCOSE 106 (H) 09/05/2022 1137   GLUCOSE 99 10/04/2015 0241   BUN 17 09/05/2022 1137   CREATININE 0.70 09/05/2022 1137   CALCIUM  9.4 09/05/2022 1137   GFRNONAA >60 10/04/2015 0241   GFRAA >60 10/04/2015 0241    BNP No results found for: BNP  ProBNP    Component Value Date/Time   PROBNP 257 09/05/2022 1137    Specialty Problems       Pulmonary Problems   COPD (chronic obstructive pulmonary disease) (HCC)   Shortness of breath    Allergies  Allergen Reactions   Naproxen Other (See Comments)    Feels like elephant is sitting on her chest. OK with smaller doses   Pregabalin     Other reaction(s): Other (See Comments) Pt states, it made her feel crazy.    Immunization History  Administered Date(s) Administered   Influenza,inj,Quad PF,6+ Mos 10/15/2017   Influenza,inj,quad, With Preservative 09/25/2018   Influenza-Unspecified 10/12/2022   PNEUMOCOCCAL CONJUGATE-20 10/23/2022    Past Medical History:  Diagnosis Date   Anxiety and depression    Aortic valve disease    a. s/p AVR 11/16;  b. Echo 11/16: mild LVH, vigorous LVF, no RWMA, bioprosthetic AVR  ok withmean 13 mmHg and trivial AI, trivial effusion   Ascending aorta dilation    Echo 11/22: EF 65-70, no RWMA, mild LVH, normal RVSF, RVSP 32.4, trivial MR, AVR with trivial AI, mean gradient 18 mmHg (stable), ascending aorta 41 mm   Breast cancer (HCC)    Dr Ezzard Dr [place], right side 2011   Carotid stenosis    a. Carotid US  10/16: bilateral ICA 1-39%   Diverticulosis    GERD (gastroesophageal reflux disease)    Nexium  Hearing difficulty of left ear    History of cardiac catheterization    a. LHC 9/16: normal coronary arteries with very large RCA   History of migraine headaches    Hypercholesterolemia    Simvastatin    Hypertension    Iron deficiency anemia    Osteoarthritis    hands   Other secondary pulmonary hypertension (HCC) 03/27/2024   Echocardiogram 5/19: EF 65-70, AVR ok w mean 16 mmHg, PASP 39 TTE 11/16/21: EF 65-70, no RWMA, mild LVH, normal RVSF, normal PASP (RVSP 32.4), trivial MR, normally functioning AVR with mean gradient 18, dilated ascending aorta (41 mm)  TTE 09/27/22: EF 65-70, no RWMA, GLS -25.5, NL RVSF, mildly elevated PASP, RVSP 44.7, trivial MR, mild to mod TR, normally functioning AVR, mean 15, trivial AI, m   Overactive bladder    Paralysis of right upper extremity (HCC)    PUD (peptic ulcer disease)    Raynaud's syndrome    Spinal stenosis     Tobacco History: Social History   Tobacco Use  Smoking Status Former   Current packs/day: 0.00   Types: Cigarettes   Quit date: 09/30/2015   Years since quitting: 8.9  Smokeless Tobacco Never   Counseling given: Not Answered   Continue to not smoke  Outpatient Encounter Medications as of 09/22/2024  Medication Sig   amLODipine  (NORVASC ) 10 MG tablet Take 1 tablet (10 mg total) by mouth daily.   aspirin  81 MG EC tablet Take 1 tablet (81 mg total) by mouth daily.   Aspirin -Acetaminophen -Caffeine (GOODY HEADACHE PO) Take 1 each by mouth 2 (two) times daily as needed (PAIN).   brimonidine   (ALPHAGAN ) 0.2 % ophthalmic solution Place 1 drop into both eyes 2 (two) times daily.   buPROPion  (WELLBUTRIN  XL) 300 MG 24 hr tablet Take 1 tablet (300 mg total) by mouth daily for depression   dorzolamide -timolol  (COSOPT ) 2-0.5 % ophthalmic solution Place 1 drop into both eyes 2 (two) times daily.   escitalopram  (LEXAPRO ) 20 MG tablet Take 1 tablet (20 mg total) by mouth daily for anxitey/depression   esomeprazole (NEXIUM) 40 MG capsule Take 40 mg by mouth daily at 12 noon.   ferrous sulfate  325 (65 FE) MG tablet Take 1 tablet by mouth every other day.    latanoprost  (XALATAN ) 0.005 % ophthalmic solution Place 1 drop into both eyes at bedtime.   oxyCODONE -acetaminophen  (PERCOCET) 10-325 MG tablet Take 1 tablet by mouth every 6 (six) hours as needed for pain.   rosuvastatin  (CRESTOR ) 20 MG tablet Take 1 tablet (20 mg total) by mouth daily.   SUMAtriptan  (IMITREX ) 100 MG tablet Take 1 Tablet  by mouth at onset of headache, may repeat in 2 hours   [DISCONTINUED] Brimonidine  Tartrate 0.025 % SOLN Place 2 Drops/kg into both eyes 2 (two) times daily.   [DISCONTINUED] buPROPion  (WELLBUTRIN  XL) 300 MG 24 hr tablet Take 300 mg by mouth daily.    [DISCONTINUED] dorzolamide -timolol  (COSOPT ) 2-0.5 % ophthalmic solution AS DIRECTED   [DISCONTINUED] escitalopram  (LEXAPRO ) 20 MG tablet Take 20 mg by mouth daily.   [DISCONTINUED] latanoprost  (XALATAN ) 0.005 % ophthalmic solution Place 1 drop into both eyes at bedtime.    [DISCONTINUED] SUMAtriptan  (IMITREX ) 100 MG tablet Take 100 mg by mouth every 2 (two) hours as needed for migraine. May repeat in 2 hours if headache persists or recurs.   No facility-administered encounter medications on file as of 09/22/2024.     Review of Systems N/a Physical Exam  BP 118/78   Pulse 61  Temp 97.8 F (36.6 C) (Oral)   Ht 5' 1 (1.549 m)   Wt 144 lb 9.6 oz (65.6 kg)   SpO2 92%   BMI 27.32 kg/m   Wt Readings from Last 5 Encounters:  09/22/24 144 lb 9.6 oz (65.6  kg)  03/28/24 150 lb 9.6 oz (68.3 kg)  10/10/23 148 lb 6.4 oz (67.3 kg)  03/28/23 143 lb 6.4 oz (65 kg)  02/14/23 134 lb (60.8 kg)    BMI Readings from Last 5 Encounters:  09/22/24 27.32 kg/m  03/28/24 28.46 kg/m  10/10/23 28.04 kg/m  03/28/23 26.23 kg/m  02/14/23 24.51 kg/m     Physical Exam General: Sitting in chair, no acute distress Eyes: EOMI, no icterus Neck: Supple, no JVP Pulmonary: Clear, distant, normal work of breathing Cardiovascular: Warm, no edema Abdomen: Nondistended, bowel sounds present MSK: No synovitis, no joint effusion Neuro: Normal gait, no weakness Psych: Normal mood, full affect   Assessment & Plan:   Dyspnea on exertion: Intermittent.  Not reliably reproducible.  History of exercise intolerance as a child.  High suspicion for underlying asthma.  Likely contributed to or exacerbated or made worse in the setting of history of significant tobacco abuse.  Recent chest x-ray is clear without significant hyperinflation on my review and interpretation.  Recent labs demonstrate improved anemia.  Possible pulmonary hypertension is contributing as well.  Improvement with bronchodilators, Stiolto argues against significant pulmonary HTN.  COPD/asthma overlap: History of exercise intolerance as a child.  Severe COPD and 09/2015 pulmonary function test  Continue Stiolto 2 puffs daily given improvement in DOE.  Samples today, will resend [employer] assistance paperwork.  This was sent or faxed in on 3 separate occasions in July 2025 but yet [employer] claims to not have received.  Possible pulmonary hypertension: TTE 09/2022 with estimated elevated RVSP over 40.  Discussed at length further evaluation including repeat PFTs, CT high-resolution, polysomnography, overnight oximetry to further assess possible contributors.  After shared decision making we will focus on COPD/asthma for now. Given improvement with bronchodilators, defer additional work-up for pulmonary  hypertension at this time.  Notably, her history of valvular dysfunction presents significant challenges with potential treatment.  Previous NT proBNP normal and normal RA size, RV size and RV function are relatively reassuring.  Return in about 1 year (around 09/22/2025) for f/u Dr. Annella.   Donnice JONELLE Annella, MD 09/22/2024

## 2024-10-01 ENCOUNTER — Telehealth: Payer: Self-pay

## 2024-10-01 NOTE — Telephone Encounter (Signed)
 Paperwork refaxed to [employer] for [employer]

## 2024-10-16 ENCOUNTER — Other Ambulatory Visit: Payer: Self-pay

## 2024-10-16 MED ORDER — STIOLTO RESPIMAT 2.5-2.5 MCG/ACT IN AERS: 2.0000 | INHALATION_SPRAY | Freq: Every day | RESPIRATORY_TRACT | 11 refills | Status: DC

## 2024-10-16 NOTE — Progress Notes (Signed)
sti

## 2024-10-17 ENCOUNTER — Telehealth: Payer: Self-pay

## 2024-10-17 NOTE — Telephone Encounter (Signed)
 Spoke with  [facility] - sent in printed Rx that was the only missing info they states

## 2024-10-26 ENCOUNTER — Other Ambulatory Visit (HOSPITAL_COMMUNITY): Payer: Self-pay

## 2024-10-27 ENCOUNTER — Other Ambulatory Visit: Payer: Self-pay

## 2024-10-27 ENCOUNTER — Other Ambulatory Visit (HOSPITAL_COMMUNITY): Payer: Self-pay

## 2024-10-27 MED ORDER — ESCITALOPRAM OXALATE 20 MG PO TABS
20.0000 mg | ORAL_TABLET | Freq: Every day | ORAL | 1 refills | Status: AC
  Filled 2024-10-27: qty 90, 90d supply, fill #0

## 2024-10-28 ENCOUNTER — Other Ambulatory Visit (HOSPITAL_COMMUNITY): Payer: Self-pay

## 2024-11-06 ENCOUNTER — Other Ambulatory Visit: Payer: Self-pay | Admitting: Pulmonary Disease

## 2024-11-06 MED ORDER — STIOLTO RESPIMAT 2.5-2.5 MCG/ACT IN AERS: 2.0000 | INHALATION_SPRAY | Freq: Every day | RESPIRATORY_TRACT | 2 refills | Status: AC

## 2024-11-06 NOTE — Addendum Note (Signed)
 Addended by: CLAUDENE NEVINS A on: 11/06/2024 10:46 AM   Modules accepted: Orders

## 2024-11-07 ENCOUNTER — Other Ambulatory Visit (HOSPITAL_COMMUNITY): Payer: Self-pay

## 2024-11-07 ENCOUNTER — Other Ambulatory Visit: Payer: Self-pay

## 2024-11-07 MED ORDER — LATANOPROST 0.005 % OP SOLN
1.0000 [drp] | Freq: Every evening | OPHTHALMIC | 2 refills | Status: AC | PRN
  Filled 2024-11-07: qty 7.5, 75d supply, fill #0

## 2024-11-07 MED ORDER — BRIMONIDINE TARTRATE 0.2 % OP SOLN
1.0000 [drp] | Freq: Two times a day (BID) | OPHTHALMIC | 2 refills | Status: AC
  Filled 2024-11-07: qty 10, 100d supply, fill #0

## 2024-11-07 MED ORDER — DORZOLAMIDE HCL-TIMOLOL MAL 2-0.5 % OP SOLN
1.0000 [drp] | Freq: Two times a day (BID) | OPHTHALMIC | 2 refills | Status: AC
  Filled 2024-11-07: qty 10, 50d supply, fill #0

## 2024-12-24 ENCOUNTER — Encounter: Payer: Self-pay | Admitting: Cardiovascular Disease

## 2024-12-25 MED ORDER — AMOXICILLIN 500 MG PO TABS: 2000.0000 mg | ORAL_TABLET | Freq: Two times a day (BID) | ORAL | 0 refills | Status: AC

## 2024-12-25 NOTE — Telephone Encounter (Signed)
 Sent amoxicillin  2000 mg to pharmacy per Stephanie Velasquez OV note 03/28/24 :  Aortic valve disease Status post bioprosthetic aortic valve replacement (AVR) in 2016 for bicuspid aortic valve with associated aortic stenosis. Currently asymptomatic. Last echocardiogram in October 2023 showed a normally functioning AVR with a mean gradient of 15 mmHg. She is aware of the need for SBE prophylaxis with amoxicillin  prior to dental work. - Continue SBE prophylaxis with amoxicillin  2 grams 30 to 60 minutes prior to dental work.

## 2025-01-17 ENCOUNTER — Other Ambulatory Visit (HOSPITAL_COMMUNITY): Payer: Self-pay
